# Patient Record
Sex: Female | Born: 1960
Health system: Southern US, Community
[De-identification: ages and names within clinical notes are randomized; demographics above are authoritative.]

## PROBLEM LIST (undated history)

## (undated) DIAGNOSIS — H269 Unspecified cataract: Secondary | ICD-10-CM

## (undated) DIAGNOSIS — G510 Bell's palsy: Secondary | ICD-10-CM

## (undated) DIAGNOSIS — G709 Myoneural disorder, unspecified: Secondary | ICD-10-CM

## (undated) DIAGNOSIS — A419 Sepsis, unspecified organism: Secondary | ICD-10-CM

## (undated) DIAGNOSIS — E119 Type 2 diabetes mellitus without complications: Secondary | ICD-10-CM

## (undated) DIAGNOSIS — E11319 Type 2 diabetes mellitus with unspecified diabetic retinopathy without macular edema: Secondary | ICD-10-CM

## (undated) DIAGNOSIS — G629 Polyneuropathy, unspecified: Secondary | ICD-10-CM

## (undated) DIAGNOSIS — I10 Essential (primary) hypertension: Secondary | ICD-10-CM

## (undated) DIAGNOSIS — H35039 Hypertensive retinopathy, unspecified eye: Secondary | ICD-10-CM

## (undated) DIAGNOSIS — E785 Hyperlipidemia, unspecified: Secondary | ICD-10-CM

## (undated) DIAGNOSIS — F32A Depression, unspecified: Secondary | ICD-10-CM

## (undated) HISTORY — DX: Type 2 diabetes mellitus with unspecified diabetic retinopathy without macular edema: E11.319

## (undated) HISTORY — DX: Hypertensive retinopathy, unspecified eye: H35.039

## (undated) HISTORY — PX: CHOLECYSTECTOMY: SHX55

## (undated) HISTORY — DX: Unspecified cataract: H26.9

---

## 1998-11-11 ENCOUNTER — Ambulatory Visit (HOSPITAL_COMMUNITY): Admission: RE | Admit: 1998-11-11 | Discharge: 1998-11-11 | Payer: Self-pay | Admitting: Family Medicine

## 1998-11-11 ENCOUNTER — Encounter: Payer: Self-pay | Admitting: Family Medicine

## 2001-10-18 ENCOUNTER — Emergency Department (HOSPITAL_COMMUNITY): Admission: EM | Admit: 2001-10-18 | Discharge: 2001-10-18 | Payer: Self-pay | Admitting: *Deleted

## 2001-10-18 ENCOUNTER — Encounter: Payer: Self-pay | Admitting: *Deleted

## 2001-10-24 ENCOUNTER — Encounter: Admission: RE | Admit: 2001-10-24 | Discharge: 2001-10-24 | Payer: Self-pay | Admitting: Internal Medicine

## 2003-03-16 DIAGNOSIS — G510 Bell's palsy: Secondary | ICD-10-CM

## 2003-03-16 HISTORY — DX: Bell's palsy: G51.0

## 2005-10-18 ENCOUNTER — Emergency Department (HOSPITAL_COMMUNITY): Admission: EM | Admit: 2005-10-18 | Discharge: 2005-10-18 | Payer: Self-pay | Admitting: Emergency Medicine

## 2005-12-09 ENCOUNTER — Ambulatory Visit: Payer: Self-pay | Admitting: Hospitalist

## 2006-08-05 ENCOUNTER — Encounter (INDEPENDENT_AMBULATORY_CARE_PROVIDER_SITE_OTHER): Payer: Self-pay | Admitting: Pulmonary Disease

## 2006-08-05 ENCOUNTER — Ambulatory Visit: Payer: Self-pay | Admitting: Internal Medicine

## 2006-08-05 DIAGNOSIS — I10 Essential (primary) hypertension: Secondary | ICD-10-CM | POA: Insufficient documentation

## 2006-08-05 DIAGNOSIS — E1140 Type 2 diabetes mellitus with diabetic neuropathy, unspecified: Secondary | ICD-10-CM

## 2006-08-05 DIAGNOSIS — F320 Major depressive disorder, single episode, mild: Secondary | ICD-10-CM

## 2006-08-05 DIAGNOSIS — Z8669 Personal history of other diseases of the nervous system and sense organs: Secondary | ICD-10-CM

## 2006-08-05 DIAGNOSIS — F17200 Nicotine dependence, unspecified, uncomplicated: Secondary | ICD-10-CM | POA: Insufficient documentation

## 2006-08-05 DIAGNOSIS — Z9089 Acquired absence of other organs: Secondary | ICD-10-CM

## 2006-08-05 DIAGNOSIS — G4700 Insomnia, unspecified: Secondary | ICD-10-CM

## 2006-08-05 DIAGNOSIS — Z9079 Acquired absence of other genital organ(s): Secondary | ICD-10-CM | POA: Insufficient documentation

## 2006-08-05 LAB — CONVERTED CEMR LAB
Alkaline Phosphatase: 85 units/L (ref 39–117)
BUN: 14 mg/dL (ref 6–23)
CO2: 19 meq/L (ref 19–32)
Creatinine, Ser: 0.59 mg/dL (ref 0.40–1.20)
Glucose, Bld: 298 mg/dL — ABNORMAL HIGH (ref 70–99)
HCT: 47.4 % — ABNORMAL HIGH (ref 36.0–46.0)
Hemoglobin: 16.1 g/dL — ABNORMAL HIGH (ref 12.0–15.0)
MCHC: 34 g/dL (ref 30.0–36.0)
MCV: 85.1 fL (ref 78.0–100.0)
RBC: 5.57 M/uL — ABNORMAL HIGH (ref 3.87–5.11)
Sodium: 135 meq/L (ref 135–145)
Total Bilirubin: 0.3 mg/dL (ref 0.3–1.2)
WBC: 9.9 10*3/uL (ref 4.0–10.5)

## 2006-08-19 ENCOUNTER — Encounter (INDEPENDENT_AMBULATORY_CARE_PROVIDER_SITE_OTHER): Payer: Self-pay | Admitting: Internal Medicine

## 2006-08-19 ENCOUNTER — Ambulatory Visit: Payer: Self-pay | Admitting: Internal Medicine

## 2006-08-23 LAB — CONVERTED CEMR LAB
BUN: 19 mg/dL (ref 6–23)
Bilirubin Urine: NEGATIVE
Chloride: 100 meq/L (ref 96–112)
Creatinine, Urine: 89.4 mg/dL
Leukocytes, UA: NEGATIVE
Microalb Creat Ratio: 79.8 mg/g — ABNORMAL HIGH (ref 0.0–30.0)
Microalb, Ur: 7.13 mg/dL — ABNORMAL HIGH (ref 0.00–1.89)
Potassium: 3.9 meq/L (ref 3.5–5.3)
Protein, ur: NEGATIVE mg/dL
RBC / HPF: NONE SEEN (ref <3)
Sodium: 140 meq/L (ref 135–145)
Specific Gravity, Urine: 1.027 (ref 1.005–1.03)
Urine Glucose: >1000 mg/dL — AB

## 2006-08-24 ENCOUNTER — Ambulatory Visit (HOSPITAL_COMMUNITY): Admission: RE | Admit: 2006-08-24 | Discharge: 2006-08-24 | Payer: Self-pay | Admitting: Obstetrics and Gynecology

## 2006-08-31 ENCOUNTER — Telehealth (INDEPENDENT_AMBULATORY_CARE_PROVIDER_SITE_OTHER): Payer: Self-pay | Admitting: *Deleted

## 2006-09-08 ENCOUNTER — Telehealth (INDEPENDENT_AMBULATORY_CARE_PROVIDER_SITE_OTHER): Payer: Self-pay | Admitting: Internal Medicine

## 2006-09-08 ENCOUNTER — Ambulatory Visit: Payer: Self-pay | Admitting: Internal Medicine

## 2006-09-08 LAB — CONVERTED CEMR LAB: Blood Glucose, Home Monitor: 3 mg/dL

## 2006-09-13 ENCOUNTER — Telehealth: Payer: Self-pay | Admitting: *Deleted

## 2006-09-28 ENCOUNTER — Telehealth: Payer: Self-pay | Admitting: *Deleted

## 2006-09-30 ENCOUNTER — Ambulatory Visit: Payer: Self-pay | Admitting: *Deleted

## 2006-09-30 ENCOUNTER — Encounter (INDEPENDENT_AMBULATORY_CARE_PROVIDER_SITE_OTHER): Payer: Self-pay | Admitting: *Deleted

## 2006-09-30 DIAGNOSIS — N760 Acute vaginitis: Secondary | ICD-10-CM | POA: Insufficient documentation

## 2006-10-01 ENCOUNTER — Encounter (INDEPENDENT_AMBULATORY_CARE_PROVIDER_SITE_OTHER): Payer: Self-pay | Admitting: *Deleted

## 2006-10-01 LAB — CONVERTED CEMR LAB
Gardnerella vaginalis: NEGATIVE
Trichomonal Vaginitis: NEGATIVE

## 2006-10-10 ENCOUNTER — Telehealth (INDEPENDENT_AMBULATORY_CARE_PROVIDER_SITE_OTHER): Payer: Self-pay | Admitting: *Deleted

## 2006-10-24 ENCOUNTER — Telehealth (INDEPENDENT_AMBULATORY_CARE_PROVIDER_SITE_OTHER): Payer: Self-pay | Admitting: *Deleted

## 2006-11-07 ENCOUNTER — Encounter (INDEPENDENT_AMBULATORY_CARE_PROVIDER_SITE_OTHER): Payer: Self-pay | Admitting: *Deleted

## 2007-05-03 ENCOUNTER — Telehealth (INDEPENDENT_AMBULATORY_CARE_PROVIDER_SITE_OTHER): Payer: Self-pay | Admitting: *Deleted

## 2007-07-30 ENCOUNTER — Emergency Department (HOSPITAL_COMMUNITY): Admission: EM | Admit: 2007-07-30 | Discharge: 2007-07-30 | Payer: Self-pay | Admitting: Family Medicine

## 2007-09-05 ENCOUNTER — Telehealth (INDEPENDENT_AMBULATORY_CARE_PROVIDER_SITE_OTHER): Payer: Self-pay | Admitting: *Deleted

## 2008-01-01 ENCOUNTER — Emergency Department (HOSPITAL_COMMUNITY): Admission: EM | Admit: 2008-01-01 | Discharge: 2008-01-01 | Payer: Self-pay | Admitting: Emergency Medicine

## 2008-06-04 ENCOUNTER — Encounter: Admission: RE | Admit: 2008-06-04 | Discharge: 2008-06-04 | Payer: Self-pay | Admitting: Orthopedic Surgery

## 2010-04-22 ENCOUNTER — Inpatient Hospital Stay (INDEPENDENT_AMBULATORY_CARE_PROVIDER_SITE_OTHER)
Admission: RE | Admit: 2010-04-22 | Discharge: 2010-04-22 | Disposition: A | Discharge status: Home / Self Care | Payer: Self-pay | Source: Ambulatory Visit | Attending: Family Medicine | Admitting: Family Medicine

## 2010-04-22 DIAGNOSIS — R071 Chest pain on breathing: Secondary | ICD-10-CM

## 2010-04-22 LAB — POCT URINALYSIS DIPSTICK
Bilirubin Urine: NEGATIVE
Nitrite: POSITIVE — AB
Protein, ur: NEGATIVE mg/dL
Specific Gravity, Urine: 1.02 (ref 1.005–1.030)
Urine Glucose, Fasting: >=1000 mg/dL — AB
Urobilinogen, UA: 0.2 mg/dL (ref 0.0–1.0)
pH: 5 (ref 5.0–8.0)

## 2010-06-19 ENCOUNTER — Other Ambulatory Visit: Payer: Self-pay | Admitting: Family Medicine

## 2010-06-19 DIAGNOSIS — Z1231 Encounter for screening mammogram for malignant neoplasm of breast: Secondary | ICD-10-CM

## 2010-06-25 ENCOUNTER — Ambulatory Visit (HOSPITAL_COMMUNITY)
Admission: RE | Admit: 2010-06-25 | Discharge: 2010-06-25 | Disposition: A | Discharge status: Home / Self Care | Payer: Self-pay | Source: Ambulatory Visit | Attending: Family Medicine | Admitting: Family Medicine

## 2010-06-25 DIAGNOSIS — Z1231 Encounter for screening mammogram for malignant neoplasm of breast: Secondary | ICD-10-CM

## 2010-12-09 LAB — POCT URINALYSIS DIP (DEVICE)
Glucose, UA: NEGATIVE
Operator id: 270961
Protein, ur: >=300 — AB
Urobilinogen, UA: 1

## 2010-12-09 LAB — URINE CULTURE

## 2012-02-28 ENCOUNTER — Encounter (HOSPITAL_COMMUNITY): Payer: Self-pay | Admitting: Emergency Medicine

## 2012-02-28 ENCOUNTER — Emergency Department (HOSPITAL_COMMUNITY): Payer: Self-pay

## 2012-02-28 ENCOUNTER — Emergency Department (HOSPITAL_COMMUNITY)
Admission: EM | Admit: 2012-02-28 | Discharge: 2012-02-29 | Disposition: A | Discharge status: Home / Self Care | Payer: Self-pay | Attending: Emergency Medicine | Admitting: Emergency Medicine

## 2012-02-28 DIAGNOSIS — Y929 Unspecified place or not applicable: Secondary | ICD-10-CM | POA: Insufficient documentation

## 2012-02-28 DIAGNOSIS — S0181XA Laceration without foreign body of other part of head, initial encounter: Secondary | ICD-10-CM

## 2012-02-28 DIAGNOSIS — IMO0002 Reserved for concepts with insufficient information to code with codable children: Secondary | ICD-10-CM | POA: Insufficient documentation

## 2012-02-28 DIAGNOSIS — Y939 Activity, unspecified: Secondary | ICD-10-CM | POA: Insufficient documentation

## 2012-02-28 DIAGNOSIS — S0180XA Unspecified open wound of other part of head, initial encounter: Secondary | ICD-10-CM | POA: Insufficient documentation

## 2012-02-28 DIAGNOSIS — E1169 Type 2 diabetes mellitus with other specified complication: Secondary | ICD-10-CM | POA: Insufficient documentation

## 2012-02-28 DIAGNOSIS — W19XXXA Unspecified fall, initial encounter: Secondary | ICD-10-CM

## 2012-02-28 DIAGNOSIS — R42 Dizziness and giddiness: Secondary | ICD-10-CM | POA: Insufficient documentation

## 2012-02-28 DIAGNOSIS — I10 Essential (primary) hypertension: Secondary | ICD-10-CM | POA: Insufficient documentation

## 2012-02-28 DIAGNOSIS — R739 Hyperglycemia, unspecified: Secondary | ICD-10-CM

## 2012-02-28 DIAGNOSIS — W108XXA Fall (on) (from) other stairs and steps, initial encounter: Secondary | ICD-10-CM | POA: Insufficient documentation

## 2012-02-28 HISTORY — DX: Essential (primary) hypertension: I10

## 2012-02-28 HISTORY — DX: Type 2 diabetes mellitus without complications: E11.9

## 2012-02-28 MED ORDER — HYDROMORPHONE HCL PF 1 MG/ML IJ SOLN: 1.0000 mg | Freq: Once | INTRAMUSCULAR | Status: DC

## 2012-02-28 MED ORDER — LIDOCAINE-EPINEPHRINE 2 %-1:100000 IJ SOLN
20.0000 mL | Freq: Once | INTRAMUSCULAR | Status: AC
  Administered 2012-02-29: 20 mL via INTRADERMAL
  Filled 2012-02-28: qty 20

## 2012-02-28 MED ORDER — SODIUM CHLORIDE 0.9 % IV BOLUS (SEPSIS): 1000.0000 mL | Freq: Once | INTRAVENOUS | Status: DC

## 2012-02-28 MED ORDER — HYDROMORPHONE HCL PF 1 MG/ML IJ SOLN
1.0000 mg | Freq: Once | INTRAMUSCULAR | Status: AC
  Administered 2012-02-28: 1 mg via INTRAMUSCULAR
  Filled 2012-02-28: qty 1

## 2012-02-28 MED ORDER — IOHEXOL 300 MG/ML  SOLN: 20.0000 mL | INTRAMUSCULAR | Status: DC

## 2012-02-28 MED ORDER — TRAMADOL HCL 50 MG PO TABS: 50.0000 mg | ORAL_TABLET | Freq: Four times a day (QID) | ORAL | Status: DC | PRN
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
## 2012-02-28 NOTE — ED Notes (Signed)
MD at bedside. 

## 2012-02-28 NOTE — ED Notes (Signed)
Patient transported to CT 

## 2012-02-28 NOTE — ED Provider Notes (Signed)
History     CSN: [id number]  Arrival date & time 02/28/12  2057   First MD Initiated Contact with Patient 02/28/12 2132      Chief Complaint  Patient presents with  . Fall  . Hypotension    (Consider location/radiation/quality/duration/timing/severity/associated sxs/prior treatment) HPI The patient presents after a fall.  She stumbled, fell backwards and down approximately 5 steps and struck her for head against concrete.  No loss of consciousness, but subsequent lightheadedness, pain about the anterior head.  The pain is sharp, nonradiating.  Pain is worse with motion or palpation.  There is no new visual changes, vomiting, diarrhea, ataxia.  The patient has been hematuria.  Just complains of pain about her coccyx, worse with motion.  No new ataxia, no incontinence, no chest pain, no abdominal pain. Past Medical History  Diagnosis Date  . Diabetes mellitus without complication   . Hypertension     Past Surgical History  Procedure Date  . Cholecystectomy   . Cesarean section     No family history on file.  History  Substance Use Topics  . Smoking status: Current Every Day Smoker  . Smokeless tobacco: Not on file  . Alcohol Use: No    OB History    Grav Para Term Preterm Abortions TAB SAB Ect Mult Living                  Review of Systems  Constitutional:       Per HPI, otherwise negative  HENT:       Per HPI, otherwise negative  Eyes: Negative.   Respiratory:       Per HPI, otherwise negative  Cardiovascular:       Per HPI, otherwise negative  Gastrointestinal: Negative for vomiting.  Genitourinary: Negative.   Musculoskeletal:       Per HPI, otherwise negative  Skin: Negative.   Neurological: Negative for syncope and weakness.    Allergies  Review of patient's allergies indicates no known allergies.  Home Medications  No current outpatient prescriptions on file.  BP 98/61  Pulse 92  Temp 98 F (36.7 C) (Oral)  Resp 18  SpO2 97%  Physical  Exam  Nursing note and vitals reviewed. Constitutional: She is oriented to person, place, and time. She appears well-developed and well-nourished. No distress. Cervical collar in place.  HENT:  Head: Normocephalic and atraumatic. Head is without Battle's sign, without right periorbital erythema and without left periorbital erythema.    Mouth/Throat: Uvula is midline, oropharynx is clear and moist and mucous membranes are normal.       No TMJ pain, no broken teeth  Eyes: Conjunctivae normal and EOM are normal.  Neck: Full passive range of motion without pain. No spinous process tenderness and no muscular tenderness present. No rigidity. No edema, no erythema and normal range of motion present.  Cardiovascular: Normal rate and regular rhythm.   Pulmonary/Chest: Effort normal and breath sounds normal. No stridor. No respiratory distress.  Abdominal: She exhibits no distension.  Musculoskeletal: She exhibits no edema.  Neurological: She is alert and oriented to person, place, and time. No cranial nerve deficit.  Skin: Skin is warm and dry.  Psychiatric: She has a normal mood and affect.    ED Course  Procedures (including critical care time)  Labs Reviewed  GLUCOSE, CAPILLARY - Abnormal; Notable for the following:    Glucose-Capillary 336 (*)     All other components within normal limits   No results found.  No diagnosis found.  Cardiac: 85sr, normal  O2- 99%ra, normal  bp improves during hospitalization  LACERATION REPAIR Performed by: Gerhard Munch Authorized by: Gerhard Munch Consent: Verbal consent obtained. Risks and benefits: risks, benefits and alternatives were discussed Consent given by: patient Patient identity confirmed: provided demographic data Prepped and Draped in normal sterile fashion Wound explored  Laceration Location: forehead  Laceration Length: 12cm  No Foreign Bodies seen or palpated  Anesthesia: local infiltration  Local anesthetic:  lidocaine 2% with epinephrine  Anesthetic total: 5 ml  Irrigation method: syringe Amount of cleaning: standard  Skin closure: 5-0 sutures  Number of sutures: 6  Technique: close approximation with central purse-string suture for stellate wound.  Patient tolerance: Patient tolerated the procedure well with no immediate complications.   MDM  This patient presents after a fall with head laceration.  Given the degree of the wound, there was a CT scan indicated.  This was unremarkable.  The patient's ED course she remained in no distress.  Though the patient was initially mildly hypotensive, this improved.  The patient's wound is repaired without complication.  The patient is hyperglycemic, she has a history of diabetes, and absent distress, ongoing other illnesses, there is little suspicion for palpitations related to the diabetes, this more likely reflects poorly controlled blood sugar.  The patient is here with multiple family members him and following repair of her wound, she is discharged in stable condition    Gerhard Munch, MD 02/28/12 2333
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
## 2012-02-29 NOTE — ED Notes (Signed)
Patient is alert and orientedx4.  Patient was explained discharge instructions and she understood them.  Patient's husband, Diedre Maclellan is taking the patient home.

## 2012-06-23 ENCOUNTER — Telehealth: Payer: Self-pay | Admitting: Family Medicine

## 2012-06-23 MED ORDER — GABAPENTIN 600 MG PO TABS: 600.0000 mg | ORAL_TABLET | Freq: Three times a day (TID) | ORAL | Status: DC

## 2012-06-23 NOTE — Telephone Encounter (Signed)
rx refilled.

## 2012-07-12 ENCOUNTER — Telehealth: Payer: Self-pay | Admitting: Family Medicine

## 2012-07-12 MED ORDER — AMITRIPTYLINE HCL 50 MG PO TABS: ORAL_TABLET | ORAL | Status: DC

## 2012-07-12 NOTE — Telephone Encounter (Signed)
Rx Refilled  

## 2012-08-22 ENCOUNTER — Telehealth: Payer: Self-pay | Admitting: Family Medicine

## 2012-08-23 MED ORDER — METOPROLOL TARTRATE 25 MG PO TABS: 25.0000 mg | ORAL_TABLET | Freq: Two times a day (BID) | ORAL | Status: DC

## 2012-08-23 MED ORDER — LISINOPRIL 40 MG PO TABS: 40.0000 mg | ORAL_TABLET | Freq: Every day | ORAL | Status: DC

## 2012-08-23 MED ORDER — GLIPIZIDE 10 MG PO TABS: 10.0000 mg | ORAL_TABLET | Freq: Every day | ORAL | Status: DC

## 2012-08-23 MED ORDER — HYDROCHLOROTHIAZIDE 25 MG PO TABS: 25.0000 mg | ORAL_TABLET | Freq: Every day | ORAL | Status: DC

## 2012-08-23 MED ORDER — METFORMIN HCL 1000 MG PO TABS: 1000.0000 mg | ORAL_TABLET | Freq: Two times a day (BID) | ORAL | Status: DC

## 2012-08-23 NOTE — Telephone Encounter (Signed)
Rx Refilled  

## 2012-11-20 ENCOUNTER — Telehealth: Payer: Self-pay | Admitting: Family Medicine

## 2012-11-20 MED ORDER — GABAPENTIN 600 MG PO TABS: 600.0000 mg | ORAL_TABLET | Freq: Three times a day (TID) | ORAL | Status: DC

## 2012-11-20 NOTE — Telephone Encounter (Signed)
Gabapentin 600 mg tab 1 TID #270

## 2012-11-20 NOTE — Telephone Encounter (Signed)
Rx Refilled  

## 2013-07-10 ENCOUNTER — Other Ambulatory Visit: Payer: Self-pay | Admitting: Family Medicine

## 2013-09-13 ENCOUNTER — Emergency Department (HOSPITAL_COMMUNITY)
Admission: EM | Admit: 2013-09-13 | Discharge: 2013-09-13 | Disposition: A | Discharge status: Home / Self Care | Payer: Self-pay | Attending: Emergency Medicine | Admitting: Emergency Medicine

## 2013-09-13 ENCOUNTER — Encounter (HOSPITAL_COMMUNITY): Payer: Self-pay | Admitting: Emergency Medicine

## 2013-09-13 DIAGNOSIS — I10 Essential (primary) hypertension: Secondary | ICD-10-CM | POA: Insufficient documentation

## 2013-09-13 DIAGNOSIS — Z5189 Encounter for other specified aftercare: Secondary | ICD-10-CM

## 2013-09-13 DIAGNOSIS — Y9301 Activity, walking, marching and hiking: Secondary | ICD-10-CM | POA: Insufficient documentation

## 2013-09-13 DIAGNOSIS — T25029A Burn of unspecified degree of unspecified foot, initial encounter: Secondary | ICD-10-CM | POA: Insufficient documentation

## 2013-09-13 DIAGNOSIS — Z792 Long term (current) use of antibiotics: Secondary | ICD-10-CM | POA: Insufficient documentation

## 2013-09-13 DIAGNOSIS — T300 Burn of unspecified body region, unspecified degree: Secondary | ICD-10-CM

## 2013-09-13 DIAGNOSIS — L03119 Cellulitis of unspecified part of limb: Secondary | ICD-10-CM

## 2013-09-13 DIAGNOSIS — Z79899 Other long term (current) drug therapy: Secondary | ICD-10-CM | POA: Insufficient documentation

## 2013-09-13 DIAGNOSIS — E1149 Type 2 diabetes mellitus with other diabetic neurological complication: Secondary | ICD-10-CM | POA: Insufficient documentation

## 2013-09-13 DIAGNOSIS — L02419 Cutaneous abscess of limb, unspecified: Secondary | ICD-10-CM | POA: Insufficient documentation

## 2013-09-13 DIAGNOSIS — L02619 Cutaneous abscess of unspecified foot: Secondary | ICD-10-CM | POA: Insufficient documentation

## 2013-09-13 DIAGNOSIS — Z4801 Encounter for change or removal of surgical wound dressing: Secondary | ICD-10-CM | POA: Insufficient documentation

## 2013-09-13 DIAGNOSIS — X19XXXA Contact with other heat and hot substances, initial encounter: Secondary | ICD-10-CM | POA: Insufficient documentation

## 2013-09-13 DIAGNOSIS — Z7982 Long term (current) use of aspirin: Secondary | ICD-10-CM | POA: Insufficient documentation

## 2013-09-13 DIAGNOSIS — F17200 Nicotine dependence, unspecified, uncomplicated: Secondary | ICD-10-CM | POA: Insufficient documentation

## 2013-09-13 DIAGNOSIS — E1142 Type 2 diabetes mellitus with diabetic polyneuropathy: Secondary | ICD-10-CM | POA: Insufficient documentation

## 2013-09-13 DIAGNOSIS — Y9289 Other specified places as the place of occurrence of the external cause: Secondary | ICD-10-CM | POA: Insufficient documentation

## 2013-09-13 MED ORDER — CLINDAMYCIN HCL 300 MG PO CAPS
300.0000 mg | ORAL_CAPSULE | Freq: Once | ORAL | Status: AC
  Administered 2013-09-13: 300 mg via ORAL
  Filled 2013-09-13: qty 2
  Filled 2013-09-13: qty 1

## 2013-09-13 MED ORDER — ONDANSETRON HCL 4 MG PO TABS: 4.0000 mg | ORAL_TABLET | Freq: Four times a day (QID) | ORAL | Status: DC

## 2013-09-13 MED ORDER — OXYCODONE-ACETAMINOPHEN 5-325 MG PO TABS
2.0000 | ORAL_TABLET | Freq: Once | ORAL | Status: AC
  Administered 2013-09-13: 2 via ORAL
  Filled 2013-09-13: qty 2

## 2013-09-13 MED ORDER — HYDROCODONE-ACETAMINOPHEN 5-325 MG PO TABS: 1.0000 | ORAL_TABLET | ORAL | Status: DC | PRN

## 2013-09-13 MED ORDER — ONDANSETRON 4 MG PO TBDP
4.0000 mg | ORAL_TABLET | Freq: Once | ORAL | Status: AC
  Administered 2013-09-13: 4 mg via ORAL
  Filled 2013-09-13: qty 1

## 2013-09-13 MED ORDER — FLUCONAZOLE 200 MG PO TABS: 200.0000 mg | ORAL_TABLET | Freq: Every day | ORAL | Status: AC

## 2013-09-13 MED ORDER — CLINDAMYCIN HCL 150 MG PO CAPS: 150.0000 mg | ORAL_CAPSULE | Freq: Four times a day (QID) | ORAL | Status: DC
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
## 2013-09-13 NOTE — Discharge Instructions (Signed)
Cellulitis Cellulitis is an infection of the skin and the tissue beneath it. The infected area is usually red and tender. Cellulitis occurs most often in the arms and lower legs.  CAUSES  Cellulitis is caused by bacteria that enter the skin through cracks or cuts in the skin. The most common types of bacteria that cause cellulitis are Staphylococcus and Streptococcus. SYMPTOMS   Redness and warmth.  Swelling.  Tenderness or pain.  Fever. DIAGNOSIS  Your caregiver can usually determine what is wrong based on a physical exam. Blood tests may also be done. TREATMENT  Treatment usually involves taking an antibiotic medicine. HOME CARE INSTRUCTIONS   Take your antibiotics as directed. Finish them even if you start to feel better.  Keep the infected arm or leg elevated to reduce swelling.  Apply a warm cloth to the affected area up to 4 times per day to relieve pain.  Only take over-the-counter or prescription medicines for pain, discomfort, or fever as directed by your caregiver.  Keep all follow-up appointments as directed by your caregiver. SEEK MEDICAL CARE IF:   You notice red streaks coming from the infected area.  Your red area gets larger or turns dark in color.  Your bone or joint underneath the infected area becomes painful after the skin has healed.  Your infection returns in the same area or another area.  You notice a swollen bump in the infected area.  You develop new symptoms. SEEK IMMEDIATE MEDICAL CARE IF:   You have a fever.  You feel very sleepy.  You develop vomiting or diarrhea.  You have a general ill feeling (malaise) with muscle aches and pains. MAKE SURE YOU:   Understand these instructions.  Will watch your condition.  Will get help right away if you are not doing well or get worse. Document Released: 12/09/2004 Document Revised: 08/31/2011 Document Reviewed: 05/17/2011 [facility] Patient Information 2015 [place], [place]. This information is  not intended to replace advice given to you by your health care provider. Make sure you discuss any questions you have with your health care provider. Burn Care Your skin is a natural barrier to infection. It is the largest organ of your body. Burns damage this natural protection. To help prevent infection, it is very important to follow your caregiver's instructions in the care of your burn. Burns are classified as:  First degree. There is only redness of the skin (erythema). No scarring is expected.  Second degree. There is blistering of the skin. Scarring may occur with deeper burns.  Third degree. All layers of the skin are injured, and scarring is expected. HOME CARE INSTRUCTIONS   Wash your hands well before changing your bandage.  Change your bandage as often as directed by your caregiver.  Remove the old bandage. If the bandage sticks, you may soak it off with cool, clean water.  Cleanse the burn thoroughly but gently with mild soap and water.  Pat the area dry with a clean, dry cloth.  Apply a thin layer of antibacterial cream to the burn.  Apply a clean bandage as instructed by your caregiver.  Keep the bandage as clean and dry as possible.  Elevate the affected area for the first 24 hours, then as instructed by your caregiver.  Only take over-the-counter or prescription medicines for pain, discomfort, or fever as directed by your caregiver. SEEK IMMEDIATE MEDICAL CARE IF:   You develop excessive pain.  You develop redness, tenderness, swelling, or red streaks near the burn.  The burned area develops yellowish-white fluid (pus) or a bad smell.  You have a fever. MAKE SURE YOU:   Understand these instructions.  Will watch your condition.  Will get help right away if you are not doing well or get worse. Document Released: 03/01/2005 Document Revised: 05/24/2011 Document Reviewed: 07/22/2010 [facility] Patient Information 2015 [place], [place]. This information is  not intended to replace advice given to you by your health care provider. Make sure you discuss any questions you have with your health care provider.
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
## 2013-09-14 NOTE — ED Provider Notes (Signed)
Medical screening examination/treatment/procedure(s) were performed by non-physician practitioner and as supervising physician I was immediately available for consultation/collaboration.   EKG Interpretation None       Kiora Hallberg, MD 09/14/13 0648 

## 2013-09-17 ENCOUNTER — Encounter (HOSPITAL_COMMUNITY): Payer: Self-pay | Admitting: Emergency Medicine

## 2013-09-17 ENCOUNTER — Emergency Department (HOSPITAL_COMMUNITY)
Admission: EM | Admit: 2013-09-17 | Discharge: 2013-09-17 | Disposition: A | Discharge status: Home / Self Care | Payer: Self-pay | Attending: Emergency Medicine | Admitting: Emergency Medicine

## 2013-09-17 DIAGNOSIS — L03119 Cellulitis of unspecified part of limb: Secondary | ICD-10-CM

## 2013-09-17 DIAGNOSIS — F17200 Nicotine dependence, unspecified, uncomplicated: Secondary | ICD-10-CM | POA: Insufficient documentation

## 2013-09-17 DIAGNOSIS — L02419 Cutaneous abscess of limb, unspecified: Secondary | ICD-10-CM | POA: Insufficient documentation

## 2013-09-17 DIAGNOSIS — Z792 Long term (current) use of antibiotics: Secondary | ICD-10-CM | POA: Insufficient documentation

## 2013-09-17 DIAGNOSIS — Z79899 Other long term (current) drug therapy: Secondary | ICD-10-CM | POA: Insufficient documentation

## 2013-09-17 DIAGNOSIS — E119 Type 2 diabetes mellitus without complications: Secondary | ICD-10-CM | POA: Insufficient documentation

## 2013-09-17 DIAGNOSIS — Z7982 Long term (current) use of aspirin: Secondary | ICD-10-CM | POA: Insufficient documentation

## 2013-09-17 DIAGNOSIS — I10 Essential (primary) hypertension: Secondary | ICD-10-CM | POA: Insufficient documentation

## 2013-09-17 LAB — BASIC METABOLIC PANEL
Anion gap: 17 — ABNORMAL HIGH (ref 5–15)
BUN: 21 mg/dL (ref 6–23)
CO2: 23 mEq/L (ref 19–32)
Calcium: 9.6 mg/dL (ref 8.4–10.5)
Chloride: 95 mEq/L — ABNORMAL LOW (ref 96–112)
Creatinine, Ser: 0.61 mg/dL (ref 0.50–1.10)
GFR calc Af Amer: >90 mL/min (ref >90)
GFR calc non Af Amer: >90 mL/min (ref >90)
Glucose, Bld: 262 mg/dL — ABNORMAL HIGH (ref 70–99)
Potassium: 4.4 mEq/L (ref 3.7–5.3)
Sodium: 135 mEq/L — ABNORMAL LOW (ref 137–147)

## 2013-09-17 LAB — CBC
HCT: 39.5 % (ref 36.0–46.0)
Hemoglobin: 13.1 g/dL (ref 12.0–15.0)
MCH: 29.3 pg (ref 26.0–34.0)
MCHC: 33.2 g/dL (ref 30.0–36.0)
MCV: 88.4 fL (ref 78.0–100.0)
Platelets: 256 10*3/uL (ref 150–400)
RBC: 4.47 MIL/uL (ref 3.87–5.11)
RDW: 13.6 % (ref 11.5–15.5)
WBC: 6.9 10*3/uL (ref 4.0–10.5)

## 2013-09-17 MED ORDER — CLINDAMYCIN PHOSPHATE 900 MG/50ML IV SOLN
900.0000 mg | Freq: Once | INTRAVENOUS | Status: AC
  Administered 2013-09-17: 900 mg via INTRAVENOUS
  Filled 2013-09-17: qty 50

## 2013-09-17 MED ORDER — OXYCODONE-ACETAMINOPHEN 5-325 MG PO TABS: 1.0000 | ORAL_TABLET | Freq: Four times a day (QID) | ORAL | Status: DC | PRN

## 2013-09-17 MED ORDER — HYDROMORPHONE HCL PF 1 MG/ML IJ SOLN
1.0000 mg | Freq: Once | INTRAMUSCULAR | Status: AC
  Administered 2013-09-17: 1 mg via INTRAVENOUS
  Filled 2013-09-17: qty 1

## 2013-09-17 MED ORDER — SILVER SULFADIAZINE 1 % EX CREA: TOPICAL_CREAM | Freq: Once | CUTANEOUS | Status: DC

## 2013-09-17 MED ORDER — CLINDAMYCIN HCL 150 MG PO CAPS: ORAL_CAPSULE | ORAL | Status: DC

## 2013-09-17 NOTE — Discharge Instructions (Signed)
Return here in 2 days, or followup with your primary care Dr. for recheck.  Return here for any worsening in your condition.  Keep the area is on her foot clean and dry
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
## 2013-09-17 NOTE — ED Notes (Signed)
Per pt she has been taking medications for cellulitis and feels like she is getting worse. sts increased pain in left leg. sts most painful behind knee. sts also she is having some memory issues.

## 2013-09-19 ENCOUNTER — Inpatient Hospital Stay (HOSPITAL_COMMUNITY)
Admission: EM | Admit: 2013-09-19 | Discharge: 2013-09-21 | DRG: 603 | Disposition: A | Discharge status: Home / Self Care | Payer: Self-pay | Attending: Oncology | Admitting: Oncology

## 2013-09-19 ENCOUNTER — Encounter (HOSPITAL_COMMUNITY): Payer: Self-pay | Admitting: Emergency Medicine

## 2013-09-19 DIAGNOSIS — T25229A Burn of second degree of unspecified foot, initial encounter: Secondary | ICD-10-CM | POA: Diagnosis present

## 2013-09-19 DIAGNOSIS — X19XXXA Contact with other heat and hot substances, initial encounter: Secondary | ICD-10-CM | POA: Diagnosis present

## 2013-09-19 DIAGNOSIS — Z794 Long term (current) use of insulin: Secondary | ICD-10-CM

## 2013-09-19 DIAGNOSIS — L02619 Cutaneous abscess of unspecified foot: Principal | ICD-10-CM | POA: Diagnosis present

## 2013-09-19 DIAGNOSIS — E1142 Type 2 diabetes mellitus with diabetic polyneuropathy: Secondary | ICD-10-CM | POA: Diagnosis present

## 2013-09-19 DIAGNOSIS — L03119 Cellulitis of unspecified part of limb: Principal | ICD-10-CM

## 2013-09-19 DIAGNOSIS — G510 Bell's palsy: Secondary | ICD-10-CM | POA: Insufficient documentation

## 2013-09-19 DIAGNOSIS — I10 Essential (primary) hypertension: Secondary | ICD-10-CM | POA: Diagnosis present

## 2013-09-19 DIAGNOSIS — F17200 Nicotine dependence, unspecified, uncomplicated: Secondary | ICD-10-CM | POA: Diagnosis present

## 2013-09-19 DIAGNOSIS — L02818 Cutaneous abscess of other sites: Secondary | ICD-10-CM

## 2013-09-19 DIAGNOSIS — Z7982 Long term (current) use of aspirin: Secondary | ICD-10-CM

## 2013-09-19 DIAGNOSIS — L03115 Cellulitis of right lower limb: Secondary | ICD-10-CM

## 2013-09-19 DIAGNOSIS — L03818 Cellulitis of other sites: Secondary | ICD-10-CM

## 2013-09-19 DIAGNOSIS — E1149 Type 2 diabetes mellitus with other diabetic neurological complication: Secondary | ICD-10-CM | POA: Diagnosis present

## 2013-09-19 DIAGNOSIS — L03116 Cellulitis of left lower limb: Secondary | ICD-10-CM

## 2013-09-19 HISTORY — DX: Myoneural disorder, unspecified: G70.9

## 2013-09-19 HISTORY — DX: Bell's palsy: G51.0

## 2013-09-19 LAB — URINALYSIS, ROUTINE W REFLEX MICROSCOPIC
Bilirubin Urine: NEGATIVE
GLUCOSE, UA: 500 mg/dL — AB
Hgb urine dipstick: NEGATIVE
Ketones, ur: NEGATIVE mg/dL
Nitrite: POSITIVE — AB
Protein, ur: NEGATIVE mg/dL
Specific Gravity, Urine: 1.018 (ref 1.005–1.030)
Urobilinogen, UA: 0.2 mg/dL (ref 0.0–1.0)
pH: 6 (ref 5.0–8.0)

## 2013-09-19 LAB — CBC WITH DIFFERENTIAL/PLATELET
BASOS ABS: 0 10*3/uL (ref 0.0–0.1)
Basophils Relative: 0 % (ref 0–1)
Eosinophils Absolute: 0.1 10*3/uL (ref 0.0–0.7)
Eosinophils Relative: 1 % (ref 0–5)
HCT: 37.2 % (ref 36.0–46.0)
Hemoglobin: 12.3 g/dL (ref 12.0–15.0)
Lymphocytes Relative: 31 % (ref 12–46)
Lymphs Abs: 3.1 10*3/uL (ref 0.7–4.0)
MCH: 28.9 pg (ref 26.0–34.0)
MCHC: 33.1 g/dL (ref 30.0–36.0)
MCV: 87.3 fL (ref 78.0–100.0)
Monocytes Absolute: 0.5 10*3/uL (ref 0.1–1.0)
Monocytes Relative: 5 % (ref 3–12)
Neutro Abs: 6.4 10*3/uL (ref 1.7–7.7)
Neutrophils Relative %: 63 % (ref 43–77)
PLATELETS: 282 10*3/uL (ref 150–400)
RBC: 4.26 MIL/uL (ref 3.87–5.11)
RDW: 13.6 % (ref 11.5–15.5)
WBC: 10 10*3/uL (ref 4.0–10.5)

## 2013-09-19 LAB — BASIC METABOLIC PANEL
ANION GAP: 20 — AB (ref 5–15)
BUN: 20 mg/dL (ref 6–23)
CALCIUM: 9.2 mg/dL (ref 8.4–10.5)
CO2: 20 mEq/L (ref 19–32)
Chloride: 94 mEq/L — ABNORMAL LOW (ref 96–112)
Creatinine, Ser: 0.71 mg/dL (ref 0.50–1.10)
GFR calc Af Amer: >90 mL/min (ref >90)
GFR calc non Af Amer: >90 mL/min (ref >90)
Glucose, Bld: 314 mg/dL — ABNORMAL HIGH (ref 70–99)
Potassium: 4 mEq/L (ref 3.7–5.3)
SODIUM: 134 meq/L — AB (ref 137–147)

## 2013-09-19 LAB — CBG MONITORING, ED
Glucose-Capillary: 305 mg/dL — ABNORMAL HIGH (ref 70–99)
Glucose-Capillary: 229 mg/dL — ABNORMAL HIGH (ref 70–99)

## 2013-09-19 LAB — HEMOGLOBIN A1C
HEMOGLOBIN A1C: 11.3 % — AB (ref <5.7)
MEAN PLASMA GLUCOSE: 278 mg/dL — AB (ref <117)

## 2013-09-19 LAB — GLUCOSE, CAPILLARY: Glucose-Capillary: 201 mg/dL — ABNORMAL HIGH (ref 70–99)

## 2013-09-19 LAB — URINE MICROSCOPIC-ADD ON

## 2013-09-19 MED ORDER — LISINOPRIL 10 MG PO TABS
10.0000 mg | ORAL_TABLET | Freq: Every day | ORAL | Status: DC
  Administered 2013-09-20 – 2013-09-21 (×2): 10 mg via ORAL
  Filled 2013-09-19 (×2): qty 1

## 2013-09-19 MED ORDER — VANCOMYCIN HCL IN DEXTROSE 1-5 GM/200ML-% IV SOLN
1000.0000 mg | Freq: Once | INTRAVENOUS | Status: AC
  Administered 2013-09-19: 1000 mg via INTRAVENOUS
  Filled 2013-09-19: qty 200

## 2013-09-19 MED ORDER — MORPHINE SULFATE 2 MG/ML IJ SOLN: 1.0000 mg | INTRAMUSCULAR | Status: DC | PRN

## 2013-09-19 MED ORDER — GABAPENTIN 600 MG PO TABS
600.0000 mg | ORAL_TABLET | Freq: Once | ORAL | Status: AC
  Administered 2013-09-19: 600 mg via ORAL
  Filled 2013-09-19: qty 1

## 2013-09-19 MED ORDER — METOPROLOL TARTRATE 25 MG PO TABS
25.0000 mg | ORAL_TABLET | Freq: Two times a day (BID) | ORAL | Status: DC
  Administered 2013-09-19 – 2013-09-21 (×4): 25 mg via ORAL
  Filled 2013-09-19 (×5): qty 1

## 2013-09-19 MED ORDER — HYDROCHLOROTHIAZIDE 25 MG PO TABS
25.0000 mg | ORAL_TABLET | Freq: Every day | ORAL | Status: DC
  Administered 2013-09-20 – 2013-09-21 (×2): 25 mg via ORAL
  Filled 2013-09-19 (×2): qty 1

## 2013-09-19 MED ORDER — ONDANSETRON HCL 4 MG/2ML IJ SOLN: 4.0000 mg | Freq: Four times a day (QID) | INTRAMUSCULAR | Status: DC | PRN

## 2013-09-19 MED ORDER — ONDANSETRON HCL 4 MG PO TABS: 4.0000 mg | ORAL_TABLET | Freq: Four times a day (QID) | ORAL | Status: DC | PRN

## 2013-09-19 MED ORDER — NICOTINE 21 MG/24HR TD PT24
21.0000 mg | MEDICATED_PATCH | Freq: Every day | TRANSDERMAL | Status: DC
  Administered 2013-09-19 – 2013-09-21 (×3): 21 mg via TRANSDERMAL
  Filled 2013-09-19 (×3): qty 1

## 2013-09-19 MED ORDER — ASPIRIN EC 81 MG PO TBEC
81.0000 mg | DELAYED_RELEASE_TABLET | Freq: Every day | ORAL | Status: DC
  Administered 2013-09-19 – 2013-09-20 (×2): 81 mg via ORAL
  Filled 2013-09-19 (×3): qty 1

## 2013-09-19 MED ORDER — HYDROCODONE-ACETAMINOPHEN 5-325 MG PO TABS
1.0000 | ORAL_TABLET | ORAL | Status: DC | PRN
  Administered 2013-09-20 – 2013-09-21 (×2): 2 via ORAL
  Filled 2013-09-19 (×2): qty 2

## 2013-09-19 MED ORDER — AMITRIPTYLINE HCL 100 MG PO TABS
100.0000 mg | ORAL_TABLET | Freq: Every day | ORAL | Status: DC
  Administered 2013-09-19 – 2013-09-20 (×2): 100 mg via ORAL
  Filled 2013-09-19 (×3): qty 1

## 2013-09-19 MED ORDER — HEPARIN SODIUM (PORCINE) 5000 UNIT/ML IJ SOLN
5000.0000 [IU] | Freq: Three times a day (TID) | INTRAMUSCULAR | Status: DC
  Administered 2013-09-19 – 2013-09-21 (×5): 5000 [IU] via SUBCUTANEOUS
  Filled 2013-09-19 (×9): qty 1

## 2013-09-19 MED ORDER — SODIUM CHLORIDE 0.9 % IV BOLUS (SEPSIS)
500.0000 mL | Freq: Once | INTRAVENOUS | Status: AC
  Administered 2013-09-19: 500 mL via INTRAVENOUS

## 2013-09-19 MED ORDER — DEXTROSE 5 % IV SOLN
1.0000 g | INTRAVENOUS | Status: DC
  Administered 2013-09-19 – 2013-09-20 (×2): 1 g via INTRAVENOUS
  Filled 2013-09-19 (×3): qty 10

## 2013-09-19 MED ORDER — PNEUMOCOCCAL VAC POLYVALENT 25 MCG/0.5ML IJ INJ
0.5000 mL | INJECTION | INTRAMUSCULAR | Status: AC
  Administered 2013-09-20: 0.5 mL via INTRAMUSCULAR
  Filled 2013-09-19: qty 0.5

## 2013-09-19 MED ORDER — INSULIN ASPART 100 UNIT/ML ~~LOC~~ SOLN
0.0000 [IU] | Freq: Three times a day (TID) | SUBCUTANEOUS | Status: DC
  Administered 2013-09-19: 5 [IU] via SUBCUTANEOUS
  Administered 2013-09-20: 8 [IU] via SUBCUTANEOUS
  Administered 2013-09-20 – 2013-09-21 (×4): 5 [IU] via SUBCUTANEOUS

## 2013-09-19 MED ORDER — SODIUM CHLORIDE 0.9 % IV SOLN
INTRAVENOUS | Status: DC
  Administered 2013-09-19 – 2013-09-20 (×2): 100 mL/h via INTRAVENOUS

## 2013-09-19 MED ORDER — GABAPENTIN 600 MG PO TABS
600.0000 mg | ORAL_TABLET | Freq: Three times a day (TID) | ORAL | Status: DC
  Administered 2013-09-19 – 2013-09-20 (×2): 600 mg via ORAL
  Filled 2013-09-19 (×4): qty 1

## 2013-09-19 MED ORDER — SODIUM CHLORIDE 0.9 % IJ SOLN
3.0000 mL | Freq: Two times a day (BID) | INTRAMUSCULAR | Status: DC
  Administered 2013-09-19 – 2013-09-21 (×2): 3 mL via INTRAVENOUS

## 2013-09-19 NOTE — ED Notes (Signed)
CBG: 305 
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
## 2013-09-19 NOTE — ED Notes (Signed)
Parker, PA at bedside for evaluation. 
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
## 2013-09-19 NOTE — ED Notes (Signed)
Bed placement, Robin notified, pt waiting for bed request, states will place bed request.
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
## 2013-09-19 NOTE — ED Notes (Signed)
CBG 229 following 500 ml NS bolus. Admitting MD informed.

## 2013-09-20 DIAGNOSIS — I10 Essential (primary) hypertension: Secondary | ICD-10-CM

## 2013-09-20 DIAGNOSIS — E1149 Type 2 diabetes mellitus with other diabetic neurological complication: Secondary | ICD-10-CM

## 2013-09-20 DIAGNOSIS — L03119 Cellulitis of unspecified part of limb: Principal | ICD-10-CM

## 2013-09-20 DIAGNOSIS — E1142 Type 2 diabetes mellitus with diabetic polyneuropathy: Secondary | ICD-10-CM

## 2013-09-20 DIAGNOSIS — L02619 Cutaneous abscess of unspecified foot: Principal | ICD-10-CM

## 2013-09-20 LAB — CBC
HEMATOCRIT: 35.9 % — AB (ref 36.0–46.0)
Hemoglobin: 11.8 g/dL — ABNORMAL LOW (ref 12.0–15.0)
MCH: 28.6 pg (ref 26.0–34.0)
MCHC: 32.9 g/dL (ref 30.0–36.0)
MCV: 86.9 fL (ref 78.0–100.0)
Platelets: 249 10*3/uL (ref 150–400)
RBC: 4.13 MIL/uL (ref 3.87–5.11)
RDW: 13.5 % (ref 11.5–15.5)
WBC: 6.8 10*3/uL (ref 4.0–10.5)

## 2013-09-20 LAB — GLUCOSE, CAPILLARY
GLUCOSE-CAPILLARY: 222 mg/dL — AB (ref 70–99)
Glucose-Capillary: 210 mg/dL — ABNORMAL HIGH (ref 70–99)
Glucose-Capillary: 258 mg/dL — ABNORMAL HIGH (ref 70–99)
Glucose-Capillary: 203 mg/dL — ABNORMAL HIGH (ref 70–99)

## 2013-09-20 LAB — COMPREHENSIVE METABOLIC PANEL
ALBUMIN: 3.2 g/dL — AB (ref 3.5–5.2)
ALK PHOS: 89 U/L (ref 39–117)
ALT: 24 U/L (ref 0–35)
AST: 19 U/L (ref 0–37)
Anion gap: 16 — ABNORMAL HIGH (ref 5–15)
BUN: 11 mg/dL (ref 6–23)
CHLORIDE: 100 meq/L (ref 96–112)
CO2: 22 mEq/L (ref 19–32)
Calcium: 8.7 mg/dL (ref 8.4–10.5)
Creatinine, Ser: 0.49 mg/dL — ABNORMAL LOW (ref 0.50–1.10)
GFR calc Af Amer: >90 mL/min (ref >90)
GFR calc non Af Amer: >90 mL/min (ref >90)
Glucose, Bld: 230 mg/dL — ABNORMAL HIGH (ref 70–99)
POTASSIUM: 3.7 meq/L (ref 3.7–5.3)
SODIUM: 138 meq/L (ref 137–147)
Total Protein: 6.8 g/dL (ref 6.0–8.3)

## 2013-09-20 MED ORDER — SILVER SULFADIAZINE 1 % EX CREA
TOPICAL_CREAM | Freq: Every day | CUTANEOUS | Status: DC
  Filled 2013-09-20: qty 85

## 2013-09-20 MED ORDER — GABAPENTIN 600 MG PO TABS
600.0000 mg | ORAL_TABLET | ORAL | Status: DC
  Administered 2013-09-20 – 2013-09-21 (×4): 600 mg via ORAL
  Filled 2013-09-20 (×4): qty 1

## 2013-09-20 MED ORDER — SILVER SULFADIAZINE 1 % EX CREA
TOPICAL_CREAM | Freq: Every day | CUTANEOUS | Status: DC
  Administered 2013-09-20 – 2013-09-21 (×2): via TOPICAL
  Filled 2013-09-20: qty 85

## 2013-09-20 MED ORDER — POTASSIUM CHLORIDE IN NACL 20-0.9 MEQ/L-% IV SOLN
INTRAVENOUS | Status: DC
  Administered 2013-09-20 (×2): via INTRAVENOUS
  Filled 2013-09-20 (×3): qty 1000

## 2013-09-20 NOTE — Progress Notes (Signed)
UR Completed Khyleigh Furney Graves-Bigelow, RN,BSN [phone]  
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
## 2013-09-20 NOTE — ED Provider Notes (Signed)
Medical screening examination/treatment/procedure(s) were conducted as a shared visit with non-physician practitioner(s) and myself.  I personally evaluated the patient during the encounter.   EKG Interpretation None      Failed outpatient therapy of cellulitis. Extending up leg now. Will place on vancomycin and admit to hospital. Pt with hx of DM  Lyanne CoKevin M Jeanifer Halliday, MD 09/20/13 [phone]
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
## 2013-09-21 LAB — CBC
HEMATOCRIT: 36.3 % (ref 36.0–46.0)
HEMOGLOBIN: 12 g/dL (ref 12.0–15.0)
MCH: 28.8 pg (ref 26.0–34.0)
MCHC: 33.1 g/dL (ref 30.0–36.0)
MCV: 87.1 fL (ref 78.0–100.0)
Platelets: 253 10*3/uL (ref 150–400)
RBC: 4.17 MIL/uL (ref 3.87–5.11)
RDW: 13.6 % (ref 11.5–15.5)
WBC: 7 10*3/uL (ref 4.0–10.5)

## 2013-09-21 LAB — BASIC METABOLIC PANEL
Anion gap: 18 — ABNORMAL HIGH (ref 5–15)
BUN: 8 mg/dL (ref 6–23)
CHLORIDE: 97 meq/L (ref 96–112)
CO2: 20 mEq/L (ref 19–32)
CREATININE: 0.52 mg/dL (ref 0.50–1.10)
Calcium: 8.8 mg/dL (ref 8.4–10.5)
GFR calc non Af Amer: >90 mL/min (ref >90)
GLUCOSE: 223 mg/dL — AB (ref 70–99)
POTASSIUM: 4.3 meq/L (ref 3.7–5.3)
Sodium: 135 mEq/L — ABNORMAL LOW (ref 137–147)

## 2013-09-21 LAB — GLUCOSE, CAPILLARY
GLUCOSE-CAPILLARY: 228 mg/dL — AB (ref 70–99)
GLUCOSE-CAPILLARY: 216 mg/dL — AB (ref 70–99)

## 2013-09-21 LAB — URINE CULTURE: Colony Count: 2000

## 2013-09-21 MED ORDER — NICOTINE 21 MG/24HR TD PT24: 21.0000 mg | MEDICATED_PATCH | Freq: Every day | TRANSDERMAL | Status: DC

## 2013-09-21 MED ORDER — INSULIN NPH ISOPHANE & REGULAR (70-30) 100 UNIT/ML ~~LOC~~ SUSP: 10.0000 [IU] | Freq: Two times a day (BID) | SUBCUTANEOUS | Status: DC

## 2013-09-21 MED ORDER — METFORMIN HCL 500 MG PO TABS: 500.0000 mg | ORAL_TABLET | Freq: Two times a day (BID) | ORAL | Status: DC

## 2013-09-21 MED ORDER — SULFAMETHOXAZOLE-TRIMETHOPRIM 400-80 MG PO TABS: 1.0000 | ORAL_TABLET | Freq: Two times a day (BID) | ORAL | Status: DC

## 2013-09-21 NOTE — Progress Notes (Signed)
  I have seen and examined the patient myself, and I have reviewed the note by Camila LiKaleb Keyserling, MS 4 and was present during the interview and physical exam.  Please see my separate progress note for additional findings, assessment, and plan.   Signed: Christen BameNora Shataria Crist, MD 09/21/2013, 1:24 PM
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
## 2013-09-23 NOTE — Progress Notes (Signed)
  I have seen and examined the patient, and reviewed the daily progress note by Camila LiKaleb Keyserling, MS 4 and discussed the care of the patient with them. Please see my progress note from 09/20/13 for further details regarding assessment and plan.    Signed:  Lorenda HatchetAdam L Keegan Ducey, MD 09/23/2013, 11:11 AM
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
## 2013-09-25 LAB — CULTURE, BLOOD (ROUTINE X 2)
CULTURE: NO GROWTH
Culture: NO GROWTH

## 2013-09-25 NOTE — ED Provider Notes (Signed)
Medical screening examination/treatment/procedure(s) were performed by non-physician practitioner and as supervising physician I was immediately available for consultation/collaboration.   EKG Interpretation None        Kyan Yurkovich N Manraj Yeo, DO 09/25/13 1713 

## 2013-11-12 ENCOUNTER — Encounter (HOSPITAL_COMMUNITY): Payer: Self-pay | Admitting: Emergency Medicine

## 2013-11-12 ENCOUNTER — Emergency Department (HOSPITAL_COMMUNITY)
Admission: EM | Admit: 2013-11-12 | Discharge: 2013-11-13 | Disposition: A | Discharge status: Home / Self Care | Payer: Self-pay | Attending: Emergency Medicine | Admitting: Emergency Medicine

## 2013-11-12 ENCOUNTER — Emergency Department (HOSPITAL_COMMUNITY): Payer: Self-pay

## 2013-11-12 DIAGNOSIS — E119 Type 2 diabetes mellitus without complications: Secondary | ICD-10-CM | POA: Insufficient documentation

## 2013-11-12 DIAGNOSIS — Z794 Long term (current) use of insulin: Secondary | ICD-10-CM | POA: Insufficient documentation

## 2013-11-12 DIAGNOSIS — R0789 Other chest pain: Secondary | ICD-10-CM | POA: Insufficient documentation

## 2013-11-12 DIAGNOSIS — Z8669 Personal history of other diseases of the nervous system and sense organs: Secondary | ICD-10-CM | POA: Insufficient documentation

## 2013-11-12 DIAGNOSIS — R079 Chest pain, unspecified: Secondary | ICD-10-CM

## 2013-11-12 DIAGNOSIS — R109 Unspecified abdominal pain: Secondary | ICD-10-CM | POA: Insufficient documentation

## 2013-11-12 DIAGNOSIS — I10 Essential (primary) hypertension: Secondary | ICD-10-CM | POA: Insufficient documentation

## 2013-11-12 DIAGNOSIS — Z79899 Other long term (current) drug therapy: Secondary | ICD-10-CM | POA: Insufficient documentation

## 2013-11-12 DIAGNOSIS — Z87891 Personal history of nicotine dependence: Secondary | ICD-10-CM | POA: Insufficient documentation

## 2013-11-12 LAB — BASIC METABOLIC PANEL
Anion gap: 21 — ABNORMAL HIGH (ref 5–15)
BUN: 21 mg/dL (ref 6–23)
CO2: 19 mEq/L (ref 19–32)
CREATININE: 0.66 mg/dL (ref 0.50–1.10)
Calcium: 9.4 mg/dL (ref 8.4–10.5)
Chloride: 96 mEq/L (ref 96–112)
GFR calc Af Amer: >90 mL/min (ref >90)
Glucose, Bld: 316 mg/dL — ABNORMAL HIGH (ref 70–99)
Potassium: 3.9 mEq/L (ref 3.7–5.3)
Sodium: 136 mEq/L — ABNORMAL LOW (ref 137–147)

## 2013-11-12 LAB — CBC WITH DIFFERENTIAL/PLATELET
BASOS PCT: 0 % (ref 0–1)
Basophils Absolute: 0 10*3/uL (ref 0.0–0.1)
EOS ABS: 0.1 10*3/uL (ref 0.0–0.7)
EOS PCT: 1 % (ref 0–5)
HEMATOCRIT: 35.3 % — AB (ref 36.0–46.0)
HEMOGLOBIN: 12 g/dL (ref 12.0–15.0)
Lymphocytes Relative: 38 % (ref 12–46)
Lymphs Abs: 3.3 10*3/uL (ref 0.7–4.0)
MCH: 29.3 pg (ref 26.0–34.0)
MCHC: 34 g/dL (ref 30.0–36.0)
MCV: 86.1 fL (ref 78.0–100.0)
MONO ABS: 0.5 10*3/uL (ref 0.1–1.0)
MONOS PCT: 5 % (ref 3–12)
Neutro Abs: 4.7 10*3/uL (ref 1.7–7.7)
Neutrophils Relative %: 56 % (ref 43–77)
Platelets: 232 10*3/uL (ref 150–400)
RBC: 4.1 MIL/uL (ref 3.87–5.11)
RDW: 14.6 % (ref 11.5–15.5)
WBC: 8.5 10*3/uL (ref 4.0–10.5)

## 2013-11-12 LAB — I-STAT TROPONIN, ED
Troponin i, poc: 0 ng/mL (ref 0.00–0.08)
Troponin i, poc: 0 ng/mL (ref 0.00–0.08)

## 2013-11-12 LAB — CBG MONITORING, ED: GLUCOSE-CAPILLARY: 235 mg/dL — AB (ref 70–99)

## 2013-11-12 MED ORDER — ASPIRIN 81 MG PO CHEW
324.0000 mg | CHEWABLE_TABLET | Freq: Once | ORAL | Status: AC
  Administered 2013-11-12: 324 mg via ORAL
  Filled 2013-11-12: qty 4

## 2013-11-12 MED ORDER — GABAPENTIN 600 MG PO TABS
600.0000 mg | ORAL_TABLET | Freq: Once | ORAL | Status: AC
  Administered 2013-11-12: 600 mg via ORAL
  Filled 2013-11-12: qty 1

## 2013-11-12 MED ORDER — SODIUM CHLORIDE 0.9 % IV BOLUS (SEPSIS)
1000.0000 mL | Freq: Once | INTRAVENOUS | Status: AC
  Administered 2013-11-12: 1000 mL via INTRAVENOUS

## 2013-11-12 NOTE — ED Provider Notes (Signed)
  This was a shared visit with a mid-level provided (NP or PA).  Throughout the patient's course I was available for consultation/collaboration.  I saw the ECG (if appropriate), relevant labs and studies - I agree with the interpretation.  On my exam the patient was in no distress.  She was awake alert, denying any ongoing pain. Patient received 1 L of IV fluids, and I advocated for more, but the patient requested discharge. With no ongoing pain, no evidence of distress, largely reassuring findings beyond her hyperglycemia, in a minor anion gap, most likely corrected with fluids, this was reasonable, and she will followup with her primary care physician.       EKG Interpretation   Date/Time:  Monday November 12 2013 19:52:47 EDT Ventricular Rate:  99 PR Interval:  170 QRS Duration: 84 QT Interval:  346 QTC Calculation: 444 R Axis:   13 Text Interpretation:  Sinus rhythm Left atrial enlargement Sinus rhythm  Left atrial enlargement Artifact Abnormal ekg Confirmed by Gerhard Munch  MD ([id number]) on 11/12/2013 8:04:23 PM         Gerhard Munch, MD 11/12/13 [phone]

## 2013-11-12 NOTE — ED Notes (Signed)
53 yo female from home with EMS with c/o LUQ pain. Sharp onset with burning pain. Per EMS pain was 10/10 with palp, no rigidity, no distention. Denies SOB and Chest Pain. Pt reports having gallbladder out. EKG WDL and vitals WDL. Pain now 0/10.

## 2013-11-12 NOTE — Discharge Instructions (Signed)

## 2013-11-12 NOTE — ED Notes (Signed)
Smoking hx just quit 55 days ago. Using [employer].

## 2013-11-12 NOTE — ED Provider Notes (Signed)
CSN: [id number]     Arrival date & time 11/12/13  1846 History   First MD Initiated Contact with Patient 11/12/13 1849     Chief Complaint  Patient presents with  . Abdominal Pain     (Consider location/radiation/quality/duration/timing/severity/associated sxs/prior Treatment) HPI Comments: Patient with past medical history of diabetes, hypertension, and hyperlipidemia, presents emergency department with chief complaint of left-sided chest pain. She states pain came on suddenly. It radiated to her left shoulder and arm. She reports associated shortness of breath, but denies any diaphoresis. She has no personal cardiac problems. She states that she has not been smoking for the past 2 months. He denies history of PE or DVT. She states the pain was severe, but has since resolved. She has not taken anything to alleviate her symptoms.  The history is provided by the patient. No language interpreter was used.    Past Medical History  Diagnosis Date  . Diabetes mellitus without complication   . Hypertension   . Bell's palsy 2005  . Neuromuscular disorder     NEUROPATHY   Past Surgical History  Procedure Laterality Date  . Cholecystectomy    . Cesarean section     No family history on file. History  Substance Use Topics  . Smoking status: Former Smoker -- 1.00 packs/day for 38 years    Types: Cigarettes    Quit date: 09/19/2013  . Smokeless tobacco: Never Used  . Alcohol Use: No   OB History   Grav Para Term Preterm Abortions TAB SAB Ect Mult Living                 Review of Systems  Constitutional: Negative for fever and chills.  Respiratory: Positive for shortness of breath.   Cardiovascular: Positive for chest pain.  Gastrointestinal: Negative for nausea, vomiting, diarrhea and constipation.  Genitourinary: Negative for dysuria.  All other systems reviewed and are negative.     Allergies  Review of patient's allergies indicates no known allergies.  Home Medications    Prior to Admission medications   Medication Sig Start Date End Date Taking? Authorizing Provider  amitriptyline (ELAVIL) 100 MG tablet Take 100 mg by mouth at bedtime.   Yes Historical Provider, MD  aspirin EC 81 MG tablet Take 81 mg by mouth at bedtime.    Yes Historical Provider, MD  gabapentin (NEURONTIN) 600 MG tablet Take 600 mg by mouth 3 (three) times daily.   Yes Historical Provider, MD  hydrochlorothiazide (HYDRODIURIL) 25 MG tablet Take 1 tablet (25 mg total) by mouth daily. 08/23/12  Yes Donita Brooks, MD  insulin NPH-regular Human (NOVOLIN 70/30) (70-30) 100 UNIT/ML injection Inject 10 Units into the skin 2 (two) times daily with a meal. 09/21/13  Yes Christen Bame, MD  lisinopril (PRINIVIL,ZESTRIL) 10 MG tablet Take 10 mg by mouth daily.   Yes Historical Provider, MD  metFORMIN (GLUCOPHAGE) 500 MG tablet Take 500 mg by mouth 3 (three) times daily.   Yes Historical Provider, MD  metoprolol tartrate (LOPRESSOR) 25 MG tablet Take 1 tablet (25 mg total) by mouth 2 (two) times daily. 08/23/12  Yes Donita Brooks, MD  neomycin-bacitracin-polymyxin (NEOSPORIN) OINT Apply 1 application topically 2 (two) times daily as needed for irritation or wound care.   Yes Historical Provider, MD   BP 153/97  Pulse 105  Temp(Src) 98 F (36.7 C) (Oral)  Resp 18  Ht  (1.626 m)  Wt 176 lb (79.833 kg)  BMI 30.20 kg/m2  SpO2 98%  Physical Exam  Nursing note and vitals reviewed. Constitutional: She is oriented to person, place, and time. She appears well-developed and well-nourished.  HENT:  Head: Normocephalic and atraumatic.  Eyes: Conjunctivae and EOM are normal. Pupils are equal, round, and reactive to light.  Neck: Normal range of motion. Neck supple.  Cardiovascular: Normal rate and regular rhythm.  Exam reveals no gallop and no friction rub.   No murmur heard. Pulmonary/Chest: Effort normal and breath sounds normal. No respiratory distress. She has no wheezes. She has no rales. She  exhibits no tenderness.  Abdominal: Soft. Bowel sounds are normal. She exhibits no distension and no mass. There is no tenderness. There is no rebound and no guarding.  Musculoskeletal: Normal range of motion. She exhibits no edema and no tenderness.  Neurological: She is alert and oriented to person, place, and time.  Skin: Skin is warm and dry.  Psychiatric: She has a normal mood and affect. Her behavior is normal. Judgment and thought content normal.    ED Course  Procedures (including critical care time) Results for orders placed during the hospital encounter of 11/12/13  CBC WITH DIFFERENTIAL      Result Value Ref Range   WBC 8.5  4.0 - 10.5 K/uL   RBC 4.10  3.87 - 5.11 MIL/uL   Hemoglobin 12.0  12.0 - 15.0 g/dL   HCT 16.1 (*) 09.6 - 04.5 %   MCV 86.1  78.0 - 100.0 fL   MCH 29.3  26.0 - 34.0 pg   MCHC 34.0  30.0 - 36.0 g/dL   RDW 40.9  81.1 - 91.4 %   Platelets 232  150 - 400 K/uL   Neutrophils Relative % 56  43 - 77 %   Neutro Abs 4.7  1.7 - 7.7 K/uL   Lymphocytes Relative 38  12 - 46 %   Lymphs Abs 3.3  0.7 - 4.0 K/uL   Monocytes Relative 5  3 - 12 %   Monocytes Absolute 0.5  0.1 - 1.0 K/uL   Eosinophils Relative 1  0 - 5 %   Eosinophils Absolute 0.1  0.0 - 0.7 K/uL   Basophils Relative 0  0 - 1 %   Basophils Absolute 0.0  0.0 - 0.1 K/uL  BASIC METABOLIC PANEL      Result Value Ref Range   Sodium 136 (*) 137 - 147 mEq/L   Potassium 3.9  3.7 - 5.3 mEq/L   Chloride 96  96 - 112 mEq/L   CO2 19  19 - 32 mEq/L   Glucose, Bld 316 (*) 70 - 99 mg/dL   BUN 21  6 - 23 mg/dL   Creatinine, Ser 7.82  0.50 - 1.10 mg/dL   Calcium 9.4  8.4 - 95.6 mg/dL   GFR calc non Af Amer >90  >90 mL/min   GFR calc Af Amer >90  >90 mL/min   Anion gap 21 (*) 5 - 15  I-STAT TROPOININ, ED      Result Value Ref Range   Troponin i, poc 0.00  0.00 - 0.08 ng/mL   Comment 3           I-STAT TROPOININ, ED      Result Value Ref Range   Troponin i, poc 0.00  0.00 - 0.08 ng/mL   Comment 3            CBG MONITORING, ED      Result Value Ref Range   Glucose-Capillary 235 (*) 70 - 99  mg/dL   Dg Chest 2 View  10/07/3662   CLINICAL DATA:  Chest pain  EXAM: CHEST  2 VIEW  COMPARISON:  None.  FINDINGS: The heart size and mediastinal contours are within normal limits. Both lungs are clear. The visualized skeletal structures are unremarkable.  IMPRESSION: No active cardiopulmonary disease.   Electronically Signed   By: Amie Portland M.D.   On: 11/12/2013 20:33     Imaging Review No results found.   EKG Interpretation   Date/Time:  Monday November 12 2013 19:52:47 EDT Ventricular Rate:  99 PR Interval:  170 QRS Duration: 84 QT Interval:  346 QTC Calculation: 444 R Axis:   13 Text Interpretation:  Sinus rhythm Left atrial enlargement Sinus rhythm  Left atrial enlargement Artifact Abnormal ekg Confirmed by Gerhard Munch  MD ([id number]) on 11/12/2013 8:04:23 PM      MDM   Final diagnoses:  Chest pain, unspecified chest pain type    Patient with sudden onset chest pain, shortness of breath. Pain radiated to the left arm. Will check labs, will reassess. Will treat with aspirin. Patient's pain is currently 0.  Labs, CXR, and EKG are reassuring.  She did have a moderate anion gap, but I have given fluids.  Patient feels well. Patient has been symptom free her entire ED stay.  Patient seen by and discussed with Dr. Jeraldine Loots, who agrees that the patient can be discharged.     Roxy Horseman, PA-C 11/12/13 2307

## 2014-02-15 ENCOUNTER — Other Ambulatory Visit: Payer: Self-pay | Admitting: Internal Medicine

## 2014-02-16 ENCOUNTER — Emergency Department (HOSPITAL_COMMUNITY)
Admission: EM | Admit: 2014-02-16 | Discharge: 2014-02-16 | Disposition: A | Discharge status: Home / Self Care | Payer: Self-pay | Attending: Emergency Medicine | Admitting: Emergency Medicine

## 2014-02-16 ENCOUNTER — Encounter (HOSPITAL_COMMUNITY): Payer: Self-pay | Admitting: *Deleted

## 2014-02-16 ENCOUNTER — Emergency Department (HOSPITAL_COMMUNITY): Payer: Self-pay

## 2014-02-16 DIAGNOSIS — Y9289 Other specified places as the place of occurrence of the external cause: Secondary | ICD-10-CM | POA: Insufficient documentation

## 2014-02-16 DIAGNOSIS — G629 Polyneuropathy, unspecified: Secondary | ICD-10-CM | POA: Insufficient documentation

## 2014-02-16 DIAGNOSIS — Y998 Other external cause status: Secondary | ICD-10-CM | POA: Insufficient documentation

## 2014-02-16 DIAGNOSIS — Z7982 Long term (current) use of aspirin: Secondary | ICD-10-CM | POA: Insufficient documentation

## 2014-02-16 DIAGNOSIS — Z791 Long term (current) use of non-steroidal anti-inflammatories (NSAID): Secondary | ICD-10-CM | POA: Insufficient documentation

## 2014-02-16 DIAGNOSIS — Y9389 Activity, other specified: Secondary | ICD-10-CM | POA: Insufficient documentation

## 2014-02-16 DIAGNOSIS — W1839XA Other fall on same level, initial encounter: Secondary | ICD-10-CM | POA: Insufficient documentation

## 2014-02-16 DIAGNOSIS — Z87891 Personal history of nicotine dependence: Secondary | ICD-10-CM | POA: Insufficient documentation

## 2014-02-16 DIAGNOSIS — E119 Type 2 diabetes mellitus without complications: Secondary | ICD-10-CM | POA: Insufficient documentation

## 2014-02-16 DIAGNOSIS — I10 Essential (primary) hypertension: Secondary | ICD-10-CM | POA: Insufficient documentation

## 2014-02-16 DIAGNOSIS — M25511 Pain in right shoulder: Secondary | ICD-10-CM

## 2014-02-16 DIAGNOSIS — Z79899 Other long term (current) drug therapy: Secondary | ICD-10-CM | POA: Insufficient documentation

## 2014-02-16 DIAGNOSIS — S4991XA Unspecified injury of right shoulder and upper arm, initial encounter: Secondary | ICD-10-CM | POA: Insufficient documentation

## 2014-02-16 DIAGNOSIS — Z794 Long term (current) use of insulin: Secondary | ICD-10-CM | POA: Insufficient documentation

## 2014-02-16 MED ORDER — NAPROXEN 500 MG PO TABS: 500.0000 mg | ORAL_TABLET | Freq: Two times a day (BID) | ORAL | Status: DC

## 2014-02-16 MED ORDER — HYDROCODONE-ACETAMINOPHEN 5-325 MG PO TABS: 1.0000 | ORAL_TABLET | Freq: Four times a day (QID) | ORAL | Status: DC | PRN

## 2014-02-16 NOTE — ED Notes (Signed)
Pt reports falling 5 weeks ago and having pain to right shoulder and arm since.

## 2014-02-16 NOTE — ED Provider Notes (Signed)
CSN: [id number]     Arrival date & time 02/16/14  1210 History  This chart was scribed for Arthor CaptainAbigail Puanani Gene, PA-C, working with Flint MelterElliott L Wentz, MD by Chestine SporeSoijett Blue, ED Scribe. The patient was seen in room TR05C/TR05C at 1:55 PM.    Chief Complaint  Patient presents with  . Fall     The history is provided by the patient. No language interpreter was used.   HPI Comments: Anna Wagner is a 53 y.o. female who presents to the Emergency Department complaining of fall onset 5 weeks ago. She fell because she has foot drop on the left foot. She reports that she fell 5 weeks ago and following had pain to the right shoulder and arm. She is unable to sleep on the shoulder or arm without pain. It is worsened with movement. She reports being right handed. She states that she has tried heat/cold with no relief for her symptoms. She denies weakness, and any other symptoms.  Past Medical History  Diagnosis Date  . Diabetes mellitus without complication   . Hypertension   . Bell's palsy 2005  . Neuromuscular disorder     NEUROPATHY   Past Surgical History  Procedure Laterality Date  . Cholecystectomy    . Cesarean section     History reviewed. No pertinent family history. History  Substance Use Topics  . Smoking status: Former Smoker -- 1.00 packs/day for 38 years    Types: Cigarettes    Quit date: 09/19/2013  . Smokeless tobacco: Never Used  . Alcohol Use: No   OB History    No data available     Review of Systems  Musculoskeletal: Positive for myalgias and arthralgias.  Neurological: Negative for weakness.      Allergies  Review of patient's allergies indicates no known allergies.  Home Medications   Prior to Admission medications   Medication Sig Start Date End Date Taking? Authorizing Provider  amitriptyline (ELAVIL) 100 MG tablet Take 100 mg by mouth at bedtime.   Yes Historical Provider, MD  aspirin EC 81 MG tablet Take 81 mg by mouth at bedtime.    Yes Historical Provider,  MD  gabapentin (NEURONTIN) 600 MG tablet Take 600 mg by mouth 3 (three) times daily.   Yes Historical Provider, MD  hydrochlorothiazide (HYDRODIURIL) 25 MG tablet Take 1 tablet (25 mg total) by mouth daily. 08/23/12  Yes Donita BrooksWarren T Pickard, MD  insulin NPH-regular Human (NOVOLIN 70/30) (70-30) 100 UNIT/ML injection Inject 10 Units into the skin 2 (two) times daily with a meal. Patient taking differently: Inject 15 Units into the skin 2 (two) times daily with a meal.  09/21/13  Yes Christen BameNora Sadek, MD  lisinopril (PRINIVIL,ZESTRIL) 10 MG tablet Take 10 mg by mouth daily.   Yes Historical Provider, MD  metFORMIN (GLUCOPHAGE) 500 MG tablet Take 500 mg by mouth 3 (three) times daily.   Yes Historical Provider, MD  metoprolol tartrate (LOPRESSOR) 25 MG tablet Take 1 tablet (25 mg total) by mouth 2 (two) times daily. Patient taking differently: Take 25 mg by mouth daily.  08/23/12  Yes Donita BrooksWarren T Pickard, MD  neomycin-bacitracin-polymyxin (NEOSPORIN) OINT Apply 1 application topically 2 (two) times daily as needed for irritation or wound care.   Yes Historical Provider, MD  sertraline (ZOLOFT) 100 MG tablet Take 100 mg by mouth daily.   Yes Historical Provider, MD  HYDROcodone-acetaminophen (NORCO) 5-325 MG per tablet Take 1-2 tablets by mouth every 6 (six) hours as needed for moderate pain. 02/16/14  Arthor CaptainAbigail Jeriah Corkum, PA-C  naproxen (NAPROSYN) 500 MG tablet Take 1 tablet (500 mg total) by mouth 2 (two) times daily with a meal. 02/16/14   Cheyeanne Roadcap, PA-C   BP 141/89 mmHg  Pulse 106  Temp(Src) 98.3 F (36.8 C) (Oral)  Resp 18  SpO2 95%  Physical Exam  Constitutional: She is oriented to person, place, and time. She appears well-developed and well-nourished. No distress.  HENT:  Head: Normocephalic and atraumatic.  Eyes: EOM are normal.  Neck: Neck supple. No tracheal deviation present.  Cardiovascular: Normal rate.   Pulmonary/Chest: Effort normal. No respiratory distress.  Musculoskeletal: Normal range  of motion.       Right shoulder: She exhibits tenderness. She exhibits no bony tenderness.  Tenderness in the left trapezius muscle. Pain with flexion of the right shoulder. Tender on the sort head tendon bicep exertion.   Neurological: She is alert and oriented to person, place, and time.  Skin: Skin is warm and dry.  Psychiatric: She has a normal mood and affect. Her behavior is normal.  Nursing note and vitals reviewed.   ED Course  Procedures (including critical care time) DIAGNOSTIC STUDIES: Oxygen Saturation is 95% on room air, normal by my interpretation.    COORDINATION OF CARE: 1:59 PM-Discussed treatment plan which includes right shoulder x-ray with pt at bedside and pt agreed to plan.   Labs Review Labs Reviewed - No data to display  Imaging Review Dg Shoulder Right  02/16/2014   CLINICAL DATA:  Right shoulder pain, fall several weeks ago  EXAM: RIGHT SHOULDER - 2+ VIEW  COMPARISON:  None.  FINDINGS: There is no evidence of fracture or dislocation. There is no evidence of arthropathy or other focal bone abnormality. Soft tissues are unremarkable. Mild AC joint degenerative change noted.  IMPRESSION: Negative.   Electronically Signed   By: Christiana PellantGretchen  Green M.D.   On: 02/16/2014 14:03     EKG Interpretation None      MDM   Final diagnoses:  Shoulder pain, right    Patient X-Ray negative for obvious fracture or dislocation. Pain managed in ED. Pt advised to follow up with orthopedics if symptoms persist for possibility of missed fracture diagnosis. Patient given brace while in ED, conservative therapy recommended and discussed. Patient will be dc home & is agreeable with above plan.   I personally performed the services described in this documentation, which was scribed in my presence. The recorded information has been reviewed and is accurate.    Arthor Captainbigail Kahealani Yankovich, PA-C 02/17/14 [phone]  Flint MelterElliott L Wentz, MD 02/17/14 [phone]

## 2014-02-16 NOTE — Discharge Instructions (Signed)
Your xray is negative. You may wear your sling for comfort.  Please take your arm out of the sling at least 5 times a day to move the shoulder joint. He denied UR at risk for developing a frozen shoulder. Please take the medications for pain as I have prescribed Do not drive, operate heavy machinery, drink alcohol, or take other tylenol containing products with this medicine.   Follow upWith an orthopedic doctor.   Shoulder Pain The shoulder is the joint that connects your arms to your body. The bones that form the shoulder joint include the upper arm bone (humerus), the shoulder blade (scapula), and the collarbone (clavicle). The top of the humerus is shaped like a ball and fits into a rather flat socket on the scapula (glenoid cavity). A combination of muscles and strong, fibrous tissues that connect muscles to bones (tendons) support your shoulder joint and hold the ball in the socket. Small, fluid-filled sacs (bursae) are located in different areas of the joint. They act as cushions between the bones and the overlying soft tissues and help reduce friction between the gliding tendons and the bone as you move your arm. Your shoulder joint allows a wide range of motion in your arm. This range of motion allows you to do things like scratch your back or throw a ball. However, this range of motion also makes your shoulder more prone to pain from overuse and injury. Causes of shoulder pain can originate from both injury and overuse and usually can be grouped in the following four categories:  Redness, swelling, and pain (inflammation) of the tendon (tendinitis) or the bursae (bursitis).  Instability, such as a dislocation of the joint.  Inflammation of the joint (arthritis).  Broken bone (fracture). HOME CARE INSTRUCTIONS   Apply ice to the sore area.  Put ice in a plastic bag.  Place a towel between your skin and the bag.  Leave the ice on for 15-20 minutes, 3-4 times per day for the first 2  days, or as directed by your health care provider.  Stop using cold packs if they do not help with the pain.  If you have a shoulder sling or immobilizer, wear it as long as your caregiver instructs. Only remove it to shower or bathe. Move your arm as little as possible, but keep your hand moving to prevent swelling.  Squeeze a soft ball or foam pad as much as possible to help prevent swelling.  Only take over-the-counter or prescription medicines for pain, discomfort, or fever as directed by your caregiver. SEEK MEDICAL CARE IF:   Your shoulder pain increases, or new pain develops in your arm, hand, or fingers.  Your hand or fingers become cold and numb.  Your pain is not relieved with medicines. SEEK IMMEDIATE MEDICAL CARE IF:   Your arm, hand, or fingers are numb or tingling.  Your arm, hand, or fingers are significantly swollen or turn white or blue. MAKE SURE YOU:   Understand these instructions.  Will watch your condition.  Will get help right away if you are not doing well or get worse. Document Released: 12/09/2004 Document Revised: 07/16/2013 Document Reviewed: 02/13/2011 [facility] Patient Information 2015 [employer] [place]. This information is not intended to replace advice given to you by your health care provider. Make sure you discuss any questions you have with your health care provider.

## 2014-02-21 ENCOUNTER — Other Ambulatory Visit: Payer: Self-pay | Admitting: Internal Medicine

## 2014-02-21 ENCOUNTER — Encounter (HOSPITAL_BASED_OUTPATIENT_CLINIC_OR_DEPARTMENT_OTHER): Payer: Self-pay | Attending: Internal Medicine

## 2014-02-21 ENCOUNTER — Ambulatory Visit (HOSPITAL_COMMUNITY)
Admission: RE | Admit: 2014-02-21 | Discharge: 2014-02-21 | Disposition: A | Discharge status: Home / Self Care | Payer: Self-pay | Source: Ambulatory Visit | Attending: Internal Medicine | Admitting: Internal Medicine

## 2014-02-21 DIAGNOSIS — E11621 Type 2 diabetes mellitus with foot ulcer: Secondary | ICD-10-CM | POA: Insufficient documentation

## 2014-02-21 DIAGNOSIS — L97529 Non-pressure chronic ulcer of other part of left foot with unspecified severity: Secondary | ICD-10-CM | POA: Insufficient documentation

## 2014-02-21 DIAGNOSIS — M868X7 Other osteomyelitis, ankle and foot: Secondary | ICD-10-CM | POA: Insufficient documentation

## 2014-02-21 DIAGNOSIS — M869 Osteomyelitis, unspecified: Secondary | ICD-10-CM

## 2014-02-22 NOTE — Progress Notes (Signed)
[facility]  NAME:  Anna Wagner, Anna Wagner                       ACCOUNT NO.:  MEDICAL RECORD NO.:  [id number]      DATE OF BIRTH:  [birth year]  PHYSICIAN:  Maxwell CaulMichael G. Robson, M.D. VISIT DATE:  02/21/2014                                  OFFICE VISIT   CHIEF COMPLAINT:  Review of a diabetic foot ulcer on the plantar left foot.  HISTORY OF PRESENT ILLNESS:  This is a patient who has type 2 diabetes, poorly controlled.  She also has diabetic neuropathy.  [place] soles of her feet, left greater than right.  [facility] early July [facility] 07/08 through 07/11 for this problem.  She was diagnosed with cellulitis and discharged on clindamycin.  I do not see that she had any positive cultures.  Her blood cultures were negative.  No vascular studies or x-rays that I can see.  I believe insulin was initiated at that point along with Janumet where her hemoglobin A1c was over 11.3.  On discussion with the patient and her mother today who are present, it does not sound like they are doing anything specific for this.  Mostly using Silvadene cream.  She is using her regular footwear.  She notes that this has been bleeding somewhat.  There has been no purulent drainage and no systemic symptoms including fever, chills, etc.  She tells me her hemoglobin A1c is down to just above 10.  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  PAST SURGICAL HISTORY:  C-section and cholecystectomy.  CURRENT MEDICATIONS: 1. Amitriptyline 100 mg at bedtime. 2. ASA 81 daily. 3. Gabapentin 600 three times a day. 4. Hydrochlorothiazide 25 daily. 5. Levemir insulin 15 units b.i.d. 6. Lisinopril 10 daily. 7. Metformin 500 b.i.d. 8. Zoloft 50 mg nightly.  PHYSICAL EXAMINATION:  VITAL SIGNS:  Temperature is 97.9, pulse  86, respirations 16, blood pressure 140/93.  Her ABIs measured in this clinic is 0.99. EXTREMITIES:  The plantar ulcer is roughly over the second and third metatarsal heads.  This measures 1.7 x 1.5 x 1.  There was thick surrounding callus and a nonviable eschar over the surface of the wound. The callus was removed, the eschar debrided.  A deep culture of this wound was done. RESPIRATORY:  Clear air entry bilaterally. CARDIAC:  Heart sounds are normal.  No murmurs. EXTREMITIES:  Absence of vibration sense in her feet compatible with her known diabetic neuropathy.  IMPRESSION/PLAN:  Wagner's grade 2 diabetic foot ulcer, possibly Wagner's grade 3.  This is in association with diabetic neuropathy and poorly-controlled type 2 diabetes.  The wound underwent debridement extensively, unfortunately this is a deep wound that is approaching her metatarsal heads.  I have ordered a plain x-ray of the area and it is quite likely the patient will require an MRI.  For today, we dressed this with silver alginate, foam, Kerlix wrap, and gave her an off- loading healing sandal.  As mentioned, it is quite likely that she is going to require an MRI to evaluate for underlying osteomyelitis in this wound that has been present for 5 months.  We will review  her again in a week's time.          ______________________________ Maxwell CaulMichael G. Robson, M.D.     MGR/MEDQ  D:  02/21/2014  T:  02/21/2014  Job:  [id number]

## 2014-03-14 ENCOUNTER — Emergency Department (HOSPITAL_COMMUNITY): Payer: Self-pay

## 2014-03-14 ENCOUNTER — Encounter (HOSPITAL_COMMUNITY): Payer: Self-pay | Admitting: Emergency Medicine

## 2014-03-14 ENCOUNTER — Inpatient Hospital Stay (HOSPITAL_COMMUNITY)
Admission: EM | Admit: 2014-03-14 | Discharge: 2014-03-17 | DRG: 617 | Disposition: A | Discharge status: Home Health | Payer: Self-pay | Attending: Internal Medicine | Admitting: Internal Medicine

## 2014-03-14 DIAGNOSIS — Z87891 Personal history of nicotine dependence: Secondary | ICD-10-CM

## 2014-03-14 DIAGNOSIS — L03119 Cellulitis of unspecified part of limb: Secondary | ICD-10-CM

## 2014-03-14 DIAGNOSIS — L97529 Non-pressure chronic ulcer of other part of left foot with unspecified severity: Secondary | ICD-10-CM

## 2014-03-14 DIAGNOSIS — T8149XA Infection following a procedure, other surgical site, initial encounter: Secondary | ICD-10-CM

## 2014-03-14 DIAGNOSIS — L03116 Cellulitis of left lower limb: Secondary | ICD-10-CM | POA: Diagnosis present

## 2014-03-14 DIAGNOSIS — E11621 Type 2 diabetes mellitus with foot ulcer: Principal | ICD-10-CM | POA: Diagnosis present

## 2014-03-14 DIAGNOSIS — F1290 Cannabis use, unspecified, uncomplicated: Secondary | ICD-10-CM | POA: Diagnosis present

## 2014-03-14 DIAGNOSIS — I10 Essential (primary) hypertension: Secondary | ICD-10-CM | POA: Diagnosis present

## 2014-03-14 DIAGNOSIS — L02612 Cutaneous abscess of left foot: Secondary | ICD-10-CM | POA: Diagnosis present

## 2014-03-14 DIAGNOSIS — Z791 Long term (current) use of non-steroidal anti-inflammatories (NSAID): Secondary | ICD-10-CM

## 2014-03-14 DIAGNOSIS — L97509 Non-pressure chronic ulcer of other part of unspecified foot with unspecified severity: Secondary | ICD-10-CM

## 2014-03-14 DIAGNOSIS — E1142 Type 2 diabetes mellitus with diabetic polyneuropathy: Secondary | ICD-10-CM | POA: Diagnosis present

## 2014-03-14 DIAGNOSIS — Z7982 Long term (current) use of aspirin: Secondary | ICD-10-CM

## 2014-03-14 DIAGNOSIS — L97429 Non-pressure chronic ulcer of left heel and midfoot with unspecified severity: Secondary | ICD-10-CM | POA: Diagnosis present

## 2014-03-14 DIAGNOSIS — T148XXA Other injury of unspecified body region, initial encounter: Secondary | ICD-10-CM

## 2014-03-14 DIAGNOSIS — L089 Local infection of the skin and subcutaneous tissue, unspecified: Secondary | ICD-10-CM | POA: Diagnosis present

## 2014-03-14 DIAGNOSIS — G609 Hereditary and idiopathic neuropathy, unspecified: Secondary | ICD-10-CM | POA: Diagnosis present

## 2014-03-14 DIAGNOSIS — E876 Hypokalemia: Secondary | ICD-10-CM | POA: Diagnosis not present

## 2014-03-14 DIAGNOSIS — E1140 Type 2 diabetes mellitus with diabetic neuropathy, unspecified: Secondary | ICD-10-CM | POA: Diagnosis present

## 2014-03-14 DIAGNOSIS — Z794 Long term (current) use of insulin: Secondary | ICD-10-CM

## 2014-03-14 DIAGNOSIS — T798XXA Other early complications of trauma, initial encounter: Secondary | ICD-10-CM

## 2014-03-14 DIAGNOSIS — E1149 Type 2 diabetes mellitus with other diabetic neurological complication: Secondary | ICD-10-CM

## 2014-03-14 DIAGNOSIS — E119 Type 2 diabetes mellitus without complications: Secondary | ICD-10-CM

## 2014-03-14 LAB — URINE MICROSCOPIC-ADD ON

## 2014-03-14 LAB — BASIC METABOLIC PANEL
ANION GAP: 13 (ref 5–15)
BUN: 20 mg/dL (ref 6–23)
CALCIUM: 9.2 mg/dL (ref 8.4–10.5)
CHLORIDE: 104 meq/L (ref 96–112)
CO2: 19 mmol/L (ref 19–32)
Creatinine, Ser: 0.6 mg/dL (ref 0.50–1.10)
GFR calc non Af Amer: >90 mL/min (ref >90)
Glucose, Bld: 160 mg/dL — ABNORMAL HIGH (ref 70–99)
Potassium: 4.2 mmol/L (ref 3.5–5.1)
SODIUM: 136 mmol/L (ref 135–145)

## 2014-03-14 LAB — URINALYSIS, ROUTINE W REFLEX MICROSCOPIC
BILIRUBIN URINE: NEGATIVE
Glucose, UA: >1000 mg/dL — AB
Ketones, ur: NEGATIVE mg/dL
NITRITE: NEGATIVE
PH: 5 (ref 5.0–8.0)
Protein, ur: NEGATIVE mg/dL
Specific Gravity, Urine: 1.027 (ref 1.005–1.030)
Urobilinogen, UA: 0.2 mg/dL (ref 0.0–1.0)

## 2014-03-14 LAB — CBC WITH DIFFERENTIAL/PLATELET
BASOS ABS: 0 10*3/uL (ref 0.0–0.1)
BASOS PCT: 0 % (ref 0–1)
Eosinophils Absolute: 0.4 10*3/uL (ref 0.0–0.7)
Eosinophils Relative: 4 % (ref 0–5)
HEMATOCRIT: 34.3 % — AB (ref 36.0–46.0)
Hemoglobin: 11.5 g/dL — ABNORMAL LOW (ref 12.0–15.0)
LYMPHS PCT: 28 % (ref 12–46)
Lymphs Abs: 2.7 10*3/uL (ref 0.7–4.0)
MCH: 28.5 pg (ref 26.0–34.0)
MCHC: 33.5 g/dL (ref 30.0–36.0)
MCV: 85.1 fL (ref 78.0–100.0)
MONO ABS: 0.8 10*3/uL (ref 0.1–1.0)
Monocytes Relative: 9 % (ref 3–12)
NEUTROS ABS: 5.8 10*3/uL (ref 1.7–7.7)
Neutrophils Relative %: 59 % (ref 43–77)
Platelets: 294 10*3/uL (ref 150–400)
RBC: 4.03 MIL/uL (ref 3.87–5.11)
RDW: 13.4 % (ref 11.5–15.5)
WBC: 9.7 10*3/uL (ref 4.0–10.5)

## 2014-03-14 LAB — I-STAT CG4 LACTIC ACID, ED: LACTIC ACID, VENOUS: 1.35 mmol/L (ref 0.5–2.2)

## 2014-03-14 LAB — ALBUMIN: Albumin: 3.4 g/dL — ABNORMAL LOW (ref 3.5–5.2)

## 2014-03-14 LAB — SEDIMENTATION RATE: Sed Rate: 115 mm/hr — ABNORMAL HIGH (ref 0–22)

## 2014-03-14 MED ORDER — PIPERACILLIN-TAZOBACTAM 3.375 G IVPB 30 MIN
3.3750 g | Freq: Once | INTRAVENOUS | Status: AC
  Administered 2014-03-14: 3.375 g via INTRAVENOUS
  Filled 2014-03-14: qty 50

## 2014-03-14 MED ORDER — AMITRIPTYLINE HCL 100 MG PO TABS
100.0000 mg | ORAL_TABLET | Freq: Every day | ORAL | Status: DC
  Administered 2014-03-14 – 2014-03-16 (×3): 100 mg via ORAL
  Filled 2014-03-14 (×5): qty 1

## 2014-03-14 MED ORDER — VANCOMYCIN HCL IN DEXTROSE 750-5 MG/150ML-% IV SOLN
750.0000 mg | Freq: Three times a day (TID) | INTRAVENOUS | Status: DC
  Filled 2014-03-14: qty 150

## 2014-03-14 MED ORDER — INSULIN DETEMIR 100 UNIT/ML ~~LOC~~ SOLN
15.0000 [IU] | Freq: Two times a day (BID) | SUBCUTANEOUS | Status: DC
  Administered 2014-03-14 – 2014-03-17 (×6): 15 [IU] via SUBCUTANEOUS
  Filled 2014-03-14 (×7): qty 0.15

## 2014-03-14 MED ORDER — SODIUM CHLORIDE 0.9 % IV SOLN
INTRAVENOUS | Status: DC
  Administered 2014-03-14 – 2014-03-16 (×3): via INTRAVENOUS

## 2014-03-14 MED ORDER — VANCOMYCIN HCL IN DEXTROSE 1-5 GM/200ML-% IV SOLN
1000.0000 mg | Freq: Two times a day (BID) | INTRAVENOUS | Status: DC
  Administered 2014-03-15 – 2014-03-17 (×5): 1000 mg via INTRAVENOUS
  Filled 2014-03-14 (×7): qty 200

## 2014-03-14 MED ORDER — OXYCODONE HCL 5 MG PO TABS: 5.0000 mg | ORAL_TABLET | ORAL | Status: DC | PRN

## 2014-03-14 MED ORDER — ACETAMINOPHEN 325 MG PO TABS
650.0000 mg | ORAL_TABLET | Freq: Four times a day (QID) | ORAL | Status: DC | PRN
  Administered 2014-03-15 – 2014-03-16 (×2): 650 mg via ORAL
  Filled 2014-03-14 (×2): qty 2

## 2014-03-14 MED ORDER — GABAPENTIN 300 MG PO CAPS
600.0000 mg | ORAL_CAPSULE | Freq: Three times a day (TID) | ORAL | Status: DC
  Administered 2014-03-14 – 2014-03-17 (×7): 600 mg via ORAL
  Filled 2014-03-14 (×11): qty 2

## 2014-03-14 MED ORDER — DEXTROSE 5 % IV SOLN
1.0000 g | Freq: Three times a day (TID) | INTRAVENOUS | Status: DC
  Administered 2014-03-14 – 2014-03-17 (×8): 1 g via INTRAVENOUS
  Filled 2014-03-14 (×11): qty 1

## 2014-03-14 MED ORDER — GABAPENTIN 600 MG PO TABS
600.0000 mg | ORAL_TABLET | Freq: Three times a day (TID) | ORAL | Status: DC
  Filled 2014-03-14: qty 1

## 2014-03-14 MED ORDER — MORPHINE SULFATE 2 MG/ML IJ SOLN: 1.0000 mg | INTRAMUSCULAR | Status: DC | PRN

## 2014-03-14 MED ORDER — ONDANSETRON HCL 4 MG PO TABS: 4.0000 mg | ORAL_TABLET | Freq: Four times a day (QID) | ORAL | Status: DC | PRN

## 2014-03-14 MED ORDER — ACETAMINOPHEN 650 MG RE SUPP
650.0000 mg | Freq: Four times a day (QID) | RECTAL | Status: DC | PRN
Start: 2014-03-14 — End: 2014-03-17

## 2014-03-14 MED ORDER — VANCOMYCIN HCL IN DEXTROSE 1-5 GM/200ML-% IV SOLN
1000.0000 mg | Freq: Once | INTRAVENOUS | Status: AC
  Administered 2014-03-14: 1000 mg via INTRAVENOUS
  Filled 2014-03-14: qty 200

## 2014-03-14 MED ORDER — ALBUTEROL SULFATE (2.5 MG/3ML) 0.083% IN NEBU: 2.5000 mg | INHALATION_SOLUTION | RESPIRATORY_TRACT | Status: DC | PRN

## 2014-03-14 MED ORDER — SODIUM CHLORIDE 0.9 % IV BOLUS (SEPSIS)
1000.0000 mL | Freq: Once | INTRAVENOUS | Status: AC
Start: 2014-03-14 — End: 2014-03-14
  Administered 2014-03-14: 1000 mL via INTRAVENOUS

## 2014-03-14 MED ORDER — ENOXAPARIN SODIUM 40 MG/0.4ML ~~LOC~~ SOLN
40.0000 mg | SUBCUTANEOUS | Status: DC
  Administered 2014-03-14 – 2014-03-16 (×3): 40 mg via SUBCUTANEOUS
  Filled 2014-03-14 (×5): qty 0.4

## 2014-03-14 MED ORDER — INFLUENZA VAC SPLIT QUAD 0.5 ML IM SUSY
0.5000 mL | PREFILLED_SYRINGE | INTRAMUSCULAR | Status: AC
  Administered 2014-03-16: 0.5 mL via INTRAMUSCULAR
  Filled 2014-03-14 (×3): qty 0.5

## 2014-03-14 MED ORDER — SERTRALINE HCL 100 MG PO TABS
100.0000 mg | ORAL_TABLET | Freq: Every day | ORAL | Status: DC
  Administered 2014-03-15 – 2014-03-16 (×2): 100 mg via ORAL
  Filled 2014-03-14 (×4): qty 1

## 2014-03-14 MED ORDER — INSULIN ASPART 100 UNIT/ML ~~LOC~~ SOLN
0.0000 [IU] | Freq: Three times a day (TID) | SUBCUTANEOUS | Status: DC
  Administered 2014-03-15: 2 [IU] via SUBCUTANEOUS
  Administered 2014-03-15: 11 [IU] via SUBCUTANEOUS
  Administered 2014-03-16: 5 [IU] via SUBCUTANEOUS
  Administered 2014-03-16: 2 [IU] via SUBCUTANEOUS
  Administered 2014-03-16: 5 [IU] via SUBCUTANEOUS
  Administered 2014-03-17: 8 [IU] via SUBCUTANEOUS
  Administered 2014-03-17: 3 [IU] via SUBCUTANEOUS

## 2014-03-14 MED ORDER — ONDANSETRON HCL 4 MG/2ML IJ SOLN: 4.0000 mg | Freq: Four times a day (QID) | INTRAMUSCULAR | Status: DC | PRN

## 2014-03-14 MED ORDER — METRONIDAZOLE IN NACL 5-0.79 MG/ML-% IV SOLN
500.0000 mg | Freq: Three times a day (TID) | INTRAVENOUS | Status: DC
  Administered 2014-03-14 – 2014-03-17 (×8): 500 mg via INTRAVENOUS
  Filled 2014-03-14 (×10): qty 100

## 2014-03-14 MED ORDER — INSULIN ASPART 100 UNIT/ML ~~LOC~~ SOLN
0.0000 [IU] | Freq: Every day | SUBCUTANEOUS | Status: DC
  Administered 2014-03-14: 3 [IU] via SUBCUTANEOUS
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
## 2014-03-14 NOTE — Progress Notes (Signed)
ANTIBIOTIC CONSULT NOTE - INITIAL  Pharmacy Consult for Vancomycin / Cefepime Indication: Suspected diabetic foot infection. High risk for MRSA.  No Known Allergies  Patient Measurements:   Adjusted Body Weight:   Vital Signs: Temp: 97.7 F (36.5 C) (12/31 1845) Temp Source: Oral (12/31 1845) BP: 129/85 mmHg (12/31 1845) Pulse Rate: 96 (12/31 1845) Intake/Output from previous day:   Intake/Output from this shift:    Labs:  Recent Labs  03/14/14 1639  WBC 9.7  HGB 11.5*  PLT 294  CREATININE 0.60   CrCl cannot be calculated (Unknown ideal weight.). No results for input(s): VANCOTROUGH, VANCOPEAK, VANCORANDOM, GENTTROUGH, GENTPEAK, GENTRANDOM, TOBRATROUGH, TOBRAPEAK, TOBRARND, AMIKACINPEAK, AMIKACINTROU, AMIKACIN in the last 72 hours.   Microbiology: No results found for this or any previous visit (from the past 720 hour(s)).  Medical History: Past Medical History  Diagnosis Date  . Diabetes mellitus without complication   . Hypertension   . Bell's palsy 2005  . Neuromuscular disorder     NEUROPATHY    Assessment: [age] yoF with PMHx DM, neuropathy, HTN, and Bell's palsy presents from wound care with suspected diabetic foot infection. Pt has received multiple courses for antibiotics for chronic wound ulcers to foot.  Recent X-ray showed fracture of 5th metatarsal, ruled out OM.    Pharmacy consulted to start Vancomycin and Cefepime. Vanc and zosyn x 1 given in ED.   12/31 >> Zosyn x1 12/31 >> Vancomycin  >> 12/31 >> Cefepime >>   12/31 >> Flagyl >>  Tmax: AF WBCs: WNL Renal: SCr 0.60, CrCl   12/31: Blood x2: P 12/31: Urine cx: Ordered (U/A - many bacteria)   Goal of Therapy:  Vancomycin trough 10-15 mcg/ml  Plan:  Vancomycin 1g IV q12h Cefepime 1g IV q8h F/u renal fxn, cultures, vanc trough as warranted  Haynes Hoehnolleen Hydeia Mcatee, PharmD, BCPS 03/14/2014, 8:03 PM  Pager: [phone]
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
## 2014-03-15 ENCOUNTER — Encounter (HOSPITAL_COMMUNITY)
Admission: EM | Disposition: A | Discharge status: Home Health | Payer: Self-pay | Source: Home / Self Care | Attending: Internal Medicine

## 2014-03-15 ENCOUNTER — Inpatient Hospital Stay (HOSPITAL_COMMUNITY): Payer: Self-pay | Admitting: Anesthesiology

## 2014-03-15 DIAGNOSIS — E876 Hypokalemia: Secondary | ICD-10-CM | POA: Diagnosis not present

## 2014-03-15 DIAGNOSIS — L02612 Cutaneous abscess of left foot: Secondary | ICD-10-CM | POA: Diagnosis present

## 2014-03-15 HISTORY — PX: AMPUTATION: SHX166

## 2014-03-15 LAB — BASIC METABOLIC PANEL
Anion gap: 9 (ref 5–15)
BUN: 19 mg/dL (ref 6–23)
CO2: 22 mmol/L (ref 19–32)
CREATININE: 0.67 mg/dL (ref 0.50–1.10)
Calcium: 8.3 mg/dL — ABNORMAL LOW (ref 8.4–10.5)
Chloride: 104 mEq/L (ref 96–112)
GFR calc Af Amer: >90 mL/min (ref >90)
GFR calc non Af Amer: >90 mL/min (ref >90)
Glucose, Bld: 266 mg/dL — ABNORMAL HIGH (ref 70–99)
Potassium: 3.2 mmol/L — ABNORMAL LOW (ref 3.5–5.1)
Sodium: 135 mmol/L (ref 135–145)

## 2014-03-15 LAB — GLUCOSE, CAPILLARY
GLUCOSE-CAPILLARY: 309 mg/dL — AB (ref 70–99)
GLUCOSE-CAPILLARY: 131 mg/dL — AB (ref 70–99)
GLUCOSE-CAPILLARY: 117 mg/dL — AB (ref 70–99)
Glucose-Capillary: 267 mg/dL — ABNORMAL HIGH (ref 70–99)
Glucose-Capillary: 102 mg/dL — ABNORMAL HIGH (ref 70–99)
Glucose-Capillary: 135 mg/dL — ABNORMAL HIGH (ref 70–99)

## 2014-03-15 LAB — CBC
HCT: 34.1 % — ABNORMAL LOW (ref 36.0–46.0)
HEMOGLOBIN: 10.9 g/dL — AB (ref 12.0–15.0)
MCH: 28 pg (ref 26.0–34.0)
MCHC: 32 g/dL (ref 30.0–36.0)
MCV: 87.7 fL (ref 78.0–100.0)
PLATELETS: 329 10*3/uL (ref 150–400)
RBC: 3.89 MIL/uL (ref 3.87–5.11)
RDW: 13.6 % (ref 11.5–15.5)
WBC: 8.4 10*3/uL (ref 4.0–10.5)

## 2014-03-15 LAB — MRSA PCR SCREENING: MRSA BY PCR: NEGATIVE

## 2014-03-15 SURGERY — AMPUTATION, FOOT, PARTIAL
Anesthesia: General | Site: Leg Lower | Laterality: Left

## 2014-03-15 MED ORDER — MIDAZOLAM HCL 5 MG/5ML IJ SOLN
INTRAMUSCULAR | Status: DC | PRN
  Administered 2014-03-15: 1 mg via INTRAVENOUS

## 2014-03-15 MED ORDER — METHOCARBAMOL 500 MG PO TABS
500.0000 mg | ORAL_TABLET | Freq: Four times a day (QID) | ORAL | Status: DC | PRN
  Administered 2014-03-16 – 2014-03-17 (×4): 500 mg via ORAL
  Filled 2014-03-15 (×4): qty 1

## 2014-03-15 MED ORDER — METOCLOPRAMIDE HCL 10 MG PO TABS: 5.0000 mg | ORAL_TABLET | Freq: Three times a day (TID) | ORAL | Status: DC | PRN

## 2014-03-15 MED ORDER — MIDAZOLAM HCL 2 MG/2ML IJ SOLN
INTRAMUSCULAR | Status: AC
  Filled 2014-03-15: qty 2

## 2014-03-15 MED ORDER — METOPROLOL TARTRATE 25 MG PO TABS: 25.0000 mg | ORAL_TABLET | Freq: Two times a day (BID) | ORAL | Status: DC

## 2014-03-15 MED ORDER — DOCUSATE SODIUM 100 MG PO CAPS
100.0000 mg | ORAL_CAPSULE | Freq: Two times a day (BID) | ORAL | Status: DC
  Administered 2014-03-15 – 2014-03-17 (×3): 100 mg via ORAL
  Filled 2014-03-15 (×5): qty 1

## 2014-03-15 MED ORDER — WHITE PETROLATUM GEL
Status: AC
  Administered 2014-03-15: 19:00:00
  Filled 2014-03-15: qty 5

## 2014-03-15 MED ORDER — HYDROCHLOROTHIAZIDE 25 MG PO TABS
25.0000 mg | ORAL_TABLET | Freq: Every day | ORAL | Status: DC
  Administered 2014-03-16 – 2014-03-17 (×2): 25 mg via ORAL
  Filled 2014-03-15 (×3): qty 1

## 2014-03-15 MED ORDER — LACTATED RINGERS IV SOLN
INTRAVENOUS | Status: DC | PRN
  Administered 2014-03-15: 17:00:00 via INTRAVENOUS

## 2014-03-15 MED ORDER — PROPOFOL 10 MG/ML IV BOLUS
INTRAVENOUS | Status: DC | PRN
  Administered 2014-03-15: 180 mg via INTRAVENOUS

## 2014-03-15 MED ORDER — METHOCARBAMOL 1000 MG/10ML IJ SOLN
500.0000 mg | Freq: Four times a day (QID) | INTRAVENOUS | Status: DC | PRN
  Filled 2014-03-15: qty 5

## 2014-03-15 MED ORDER — ONDANSETRON HCL 4 MG/2ML IJ SOLN
INTRAMUSCULAR | Status: DC | PRN
  Administered 2014-03-15: 4 mg via INTRAVENOUS

## 2014-03-15 MED ORDER — HYDROMORPHONE HCL 1 MG/ML IJ SOLN: 0.5000 mg | INTRAMUSCULAR | Status: DC | PRN

## 2014-03-15 MED ORDER — CHLORHEXIDINE GLUCONATE 4 % EX LIQD
60.0000 mL | Freq: Once | CUTANEOUS | Status: AC
  Administered 2014-03-15: 4 via TOPICAL
  Filled 2014-03-15: qty 60

## 2014-03-15 MED ORDER — METOCLOPRAMIDE HCL 5 MG/ML IJ SOLN: 5.0000 mg | Freq: Three times a day (TID) | INTRAMUSCULAR | Status: DC | PRN

## 2014-03-15 MED ORDER — PROPOFOL 10 MG/ML IV BOLUS
INTRAVENOUS | Status: AC
  Filled 2014-03-15: qty 20

## 2014-03-15 MED ORDER — ONDANSETRON HCL 4 MG PO TABS
4.0000 mg | ORAL_TABLET | Freq: Four times a day (QID) | ORAL | Status: DC | PRN
  Administered 2014-03-16: 4 mg via ORAL
  Filled 2014-03-15: qty 1

## 2014-03-15 MED ORDER — 0.9 % SODIUM CHLORIDE (POUR BTL) OPTIME
TOPICAL | Status: DC | PRN
  Administered 2014-03-15: 1000 mL

## 2014-03-15 MED ORDER — LISINOPRIL 10 MG PO TABS
10.0000 mg | ORAL_TABLET | Freq: Every day | ORAL | Status: DC
  Administered 2014-03-16 – 2014-03-17 (×2): 10 mg via ORAL
  Filled 2014-03-15 (×3): qty 1

## 2014-03-15 MED ORDER — FENTANYL CITRATE 0.05 MG/ML IJ SOLN
INTRAMUSCULAR | Status: DC | PRN
  Administered 2014-03-15 (×2): 50 ug via INTRAVENOUS

## 2014-03-15 MED ORDER — OXYCODONE-ACETAMINOPHEN 5-325 MG PO TABS
1.0000 | ORAL_TABLET | ORAL | Status: DC | PRN
  Administered 2014-03-15 – 2014-03-16 (×3): 1 via ORAL
  Administered 2014-03-16 – 2014-03-17 (×2): 2 via ORAL
  Filled 2014-03-15 (×2): qty 1
  Filled 2014-03-15 (×2): qty 2
  Filled 2014-03-15: qty 1

## 2014-03-15 MED ORDER — ONDANSETRON HCL 4 MG/2ML IJ SOLN: 4.0000 mg | Freq: Four times a day (QID) | INTRAMUSCULAR | Status: DC | PRN

## 2014-03-15 MED ORDER — LIDOCAINE HCL (CARDIAC) 20 MG/ML IV SOLN
INTRAVENOUS | Status: DC | PRN
  Administered 2014-03-15: 50 mg via INTRAVENOUS

## 2014-03-15 MED ORDER — FENTANYL CITRATE 0.05 MG/ML IJ SOLN
INTRAMUSCULAR | Status: AC
Start: 2014-03-15 — End: 2014-03-15
  Filled 2014-03-15: qty 5

## 2014-03-15 MED ORDER — ASPIRIN EC 81 MG PO TBEC
81.0000 mg | DELAYED_RELEASE_TABLET | Freq: Every day | ORAL | Status: DC
  Administered 2014-03-15 – 2014-03-16 (×2): 81 mg via ORAL
  Filled 2014-03-15 (×4): qty 1

## 2014-03-15 SURGICAL SUPPLY — 29 items
BLADE SAW SGTL HD 18.5X60.5X1. (BLADE) ×3 IMPLANT
BLADE SURG 10 STRL SS (BLADE) IMPLANT
BNDG COHESIVE 4X5 TAN STRL (GAUZE/BANDAGES/DRESSINGS) ×3 IMPLANT
BNDG GAUZE ELAST 4 BULKY (GAUZE/BANDAGES/DRESSINGS) ×3 IMPLANT
COVER SURGICAL LIGHT HANDLE (MISCELLANEOUS) ×3 IMPLANT
DRAPE U-SHAPE 47X51 STRL (DRAPES) ×3 IMPLANT
DRSG ADAPTIC 3X8 NADH LF (GAUZE/BANDAGES/DRESSINGS) ×3 IMPLANT
DRSG PAD ABDOMINAL 8X10 ST (GAUZE/BANDAGES/DRESSINGS) ×3 IMPLANT
DURAPREP 26ML APPLICATOR (WOUND CARE) ×3 IMPLANT
ELECT REM PT RETURN 9FT ADLT (ELECTROSURGICAL) ×3
ELECTRODE REM PT RTRN 9FT ADLT (ELECTROSURGICAL) ×1 IMPLANT
GAUZE SPONGE 4X4 12PLY STRL (GAUZE/BANDAGES/DRESSINGS) ×3 IMPLANT
GLOVE BIOGEL PI IND STRL 9 (GLOVE) ×1 IMPLANT
GLOVE BIOGEL PI INDICATOR 9 (GLOVE) ×2
GLOVE SURG ORTHO 9.0 STRL STRW (GLOVE) ×3 IMPLANT
GOWN STRL REUS W/ TWL XL LVL3 (GOWN DISPOSABLE) ×3 IMPLANT
GOWN STRL REUS W/TWL XL LVL3 (GOWN DISPOSABLE) ×6
KIT BASIN OR (CUSTOM PROCEDURE TRAY) ×3 IMPLANT
KIT ROOM TURNOVER OR (KITS) ×3 IMPLANT
NS IRRIG 1000ML POUR BTL (IV SOLUTION) ×3 IMPLANT
PACK ORTHO EXTREMITY (CUSTOM PROCEDURE TRAY) ×3 IMPLANT
PAD ARMBOARD 7.5X6 YLW CONV (MISCELLANEOUS) ×6 IMPLANT
SPONGE GAUZE 4X4 12PLY STER LF (GAUZE/BANDAGES/DRESSINGS) ×3 IMPLANT
SPONGE LAP 18X18 X RAY DECT (DISPOSABLE) ×3 IMPLANT
SUT ETHILON 2 0 PSLX (SUTURE) ×9 IMPLANT
SUT VIC AB 2-0 CTB1 (SUTURE) IMPLANT
TOWEL OR 17X24 6PK STRL BLUE (TOWEL DISPOSABLE) ×3 IMPLANT
TOWEL OR 17X26 10 PK STRL BLUE (TOWEL DISPOSABLE) ×3 IMPLANT
WATER STERILE IRR 1000ML POUR (IV SOLUTION) ×3 IMPLANT

## 2014-03-15 NOTE — Transfer of Care (Signed)
Immediate Anesthesia Transfer of Care Note  Patient: Anna Wagner  Procedure(s) Performed: Procedure(s): AMPUTATION TRANSMETATARSAL (Left)  Patient Location: PACU  Anesthesia Type:General  Level of Consciousness: awake and alert   Airway & Oxygen Therapy: Patient Spontanous Breathing and Patient connected to nasal cannula oxygen  Post-op Assessment: Report given to PACU RN and Post -op Vital signs reviewed and stable  Post vital signs: stable  Complications: No apparent anesthesia complications

## 2014-03-15 NOTE — Progress Notes (Signed)
Inpatient Diabetes Program Recommendations  AACE/ADA: New Consensus Statement on Inpatient Glycemic Control (2013)  Target Ranges:  Prepandial:   less than 140 mg/dL      Peak postprandial:   less than 180 mg/dL (1-2 hours)      Critically ill patients:  140 - 180 mg/dL   Reason for Visit: Hypeglycemia  Diabetes history: DM2 Outpatient Diabetes medications: Levemir 15 units bid, metformin 500 bid Current orders for Inpatient glycemic control: Levemir 15 units bid and Novolog moderate tidwc and hs   54 year old female admitted with infected L foot, cellulitis. Need updated HgbA1C to assess glycemic control at home.   Results for TANGIA, PINARD (MRN [id number]) as of 03/15/2014 10:13  Ref. Range 09/21/2013 07:45 09/21/2013 12:06 11/12/2013 22:41 03/14/2014 21:31 03/15/2014 07:28  Glucose-Capillary Latest Range: 70-99 mg/dL 562 (H) 130 (H) 865 (H) 267 (H) 309 (H)  Results for PENELOPE, FITTRO (MRN [id number]) as of 03/15/2014 10:13  Ref. Range 03/15/2014 04:35  Glucose Latest Range: 70-99 mg/dL 284 (H)   Poorly-controlled DM. Has PCP - will need f/u when discharged for management of DM.  Recommendations: Check HgbA1C to assess glycemic control prior to hospitalization Add meal coverage insulin - Novolog 4 units tidwc. Titrate until CBGs < 180 mg/dL If FBS >132 mg/dL, increase Levemir. Check daily. Will need f/u with PCP for diabetes management to improve glycemic control.  Thank you. Ailene Ards, RD, LDN, CDE Inpatient Diabetes Coordinator [phone]

## 2014-03-15 NOTE — Progress Notes (Signed)
Patient transferred to [facility] via [facility]. Patient in no distress and denies having any pain. Patient and patient's mother still have questions regarding surgical procedure therefore consent was not obtained.

## 2014-03-15 NOTE — Progress Notes (Signed)
[facility] PROGRESS NOTE  ROBINETTE Wagner ZOX:[id number] DOB: 04-18-1960 DOA: 03/14/2014 PCP: Tomi Bamberger, NP  Brief narrative 54 year old female with history of uncontrolled diabetes, diabetic neuropathy, hypertension who sustained an injury on the plantar aspect of her left foot or [place] the [place]. Following which she developed chronic wounds on the foot and was being seen by her PCP. She then developed chronic wound and ulcer on that area and was treated with multiple courses of antibiotic for infection. She was hospitalized in July for IV antibiotics. She was then seen at the wound center earlier this month with an x-ray done which was negative for similar days. She was however found to have a fracture at the base of the fifth metatarsal and a cast was placed on that foot. The cost was removed 1 week back. When she went for follow-up on the day of admission they found a wound with foul-smelling discharge on the dorsum surface of the base of the second and third toe. Patient admitted for IV antibiotic and orthopedic consult. X-ray negative for osteomyelitis. Patient denies any pain, fever, chills, nausea or vomiting.  Assessment/Plan: Cellulitis of the left foot and abscess Patient on empiric vancomycin, by mouth and Flagyl. She also has a deep ulcer over the plantar aspect of left foot.  Seen by orthopedic consult who recommends this is likely an abscess with underlying osteomyelitis. He plans on once metatarsal amputation of the left foot given extensive cellulitis and abscess along with osteomyelitis. Patient will be transferred to [facility] for OR possibly today.  Controlled type 2 diabetes mellitus Continue home dose Levemir. Hold metformin. Insulin A1c pending. Continue sliding scale insulin  Hypertension Blood pressure stable. I will resume her home blood pressure medications (lisinopril, HCTZ and metoprolol.)  Diabetic neuropathy Continue Neurontin and amitriptyline  ?  UTI Urine culture ordered. Continue empiric antibiotics  Hypokalemia Replenished  DVT prophylaxis: Subcutaneous Lovenox  Diet: Nothing by mouth for OR   Code Status: Full code Family Communication: Husband at  bedside Disposition Plan: tx to , sigh out give to my partner Dr Benjamine Mola.   Consultants:    Procedures:    Antibiotics:  IV vanco, cefepime and  Flagyl  HPI/Subjective: Since seen and examined. Admission H&P reviewed. Denies any pain over the left foot. Patient tearful that she will need amputation of the foot.  Objective: Filed Vitals:   03/15/14 0532  BP: 132/74  Pulse: 90  Temp: 97.8 F (36.6 C)  Resp: 18    Intake/Output Summary (Last 24 hours) at 03/15/14 1138 Last data filed at 03/15/14 0515  Gross per 24 hour  Intake 863.33 ml  Output      0 ml  Net 863.33 ml   Filed Weights   03/14/14 1949  Weight: 79.198 kg (174 lb 9.6 oz)    Exam:   General:  Middle aged female in no acute distress  HEENT: No pallor, moist oral mucosa  Chest: Clear to auscultation bilaterally  CVS: Normal S1 and S2, no murmurs  Abdomen: Soft, nontender, nondistended  Extremities: Dressing over the left foot, cellulitis with foul-smelling discharge over the dorsum of the second and third foot, deep through and through ulcer over the center aspect of the left foot    Data Reviewed: Basic Metabolic Panel:  Recent Labs Lab 03/14/14 1639 03/15/14 0435  NA 136 135  K 4.2 3.2*  CL 104 104  CO2 19 22  GLUCOSE 160* 266*  BUN 20 19  CREATININE 0.60 0.67  CALCIUM 9.2 8.3*   Liver Function Tests:  Recent Labs Lab 03/14/14 1950  ALBUMIN 3.4*   No results for input(s): LIPASE, AMYLASE in the last 168 hours. No results for input(s): AMMONIA in the last 168 hours. CBC:  Recent Labs Lab 03/14/14 1639 03/15/14 0435  WBC 9.7 8.4  NEUTROABS 5.8  --   HGB 11.5* 10.9*  HCT 34.3* 34.1*  MCV 85.1 87.7  PLT 294 329   Cardiac Enzymes: No results  for input(s): CKTOTAL, CKMB, CKMBINDEX, TROPONINI in the last 168 hours. BNP (last 3 results) No results for input(s): PROBNP in the last 8760 hours. CBG:  Recent Labs Lab 03/14/14 2131 03/15/14 0728  GLUCAP 267* 309*    No results found for this or any previous visit (from the past 240 hour(s)).   Studies: Dg Foot Complete Left  03/14/2014   CLINICAL DATA:  Nonhealing foot wound.  Diabetic.  EXAM: LEFT FOOT - COMPLETE 3+ VIEW  COMPARISON:  02/21/2014.  FINDINGS: The previously demonstrated fracture of the base of the fifth metatarsal is no longer seen. Large calcaneal spurs are again demonstrated. No soft tissue gas, bone destruction or periosteal reaction. Mild diffuse dorsal soft tissue swelling.  IMPRESSION: Stable large calcaneal spurs. No radiographic evidence of osteomyelitis.   Electronically Signed   By: Gordan Payment M.D.   On: 03/14/2014 16:10    Scheduled Meds: . amitriptyline  100 mg Oral QHS  . ceFEPime (MAXIPIME) IV  1 g Intravenous 3 times per day  . enoxaparin (LOVENOX) injection  40 mg Subcutaneous Q24H  . gabapentin  600 mg Oral TID  . Influenza vac split quadrivalent PF  0.5 mL Intramuscular Tomorrow-1000  . insulin aspart  0-15 Units Subcutaneous TID WC  . insulin aspart  0-5 Units Subcutaneous QHS  . insulin detemir  15 Units Subcutaneous BID  . metronidazole  500 mg Intravenous Q8H  . sertraline  100 mg Oral Daily  . vancomycin  1,000 mg Intravenous Q12H   Continuous Infusions: . sodium chloride 100 mL/hr at 03/14/14 2037      Time spent: 25 minutes    Eddie North  [facility] Pager [phone] 7PM-7AM, please contact night-coverage at [email or web address], password [facility] 03/15/2014, 11:38 AM  LOS: 1 day

## 2014-03-15 NOTE — Consult Note (Signed)
Reason for Consult: Abscess osteomyelitis ulceration left forefoot Referring Physician: Dr. Vanita Panda Rabanal is an 54 y.o. female.  HPI: Patient is a 54 year old woman with diabetic insensate neuropathy she previously burned both feet however had persistent ulceration beneath the second and third metatarsal head left foot. Patient was treated in serial total contact cast and developed an ulcer dorsally and was brought to the hospital for management and treatment of the plantar and dorsal ulcer.  Past Medical History  Diagnosis Date  . Diabetes mellitus without complication   . Hypertension   . Bell's palsy 2005  . Neuromuscular disorder     NEUROPATHY    Past Surgical History  Procedure Laterality Date  . Cholecystectomy    . Cesarean section      Family History  Problem Relation Age of Onset  . Heart attack Mother     Social History:  reports that she quit smoking about 5 months ago. Her smoking use included Cigarettes. She has a 38 pack-year smoking history. She has never used smokeless tobacco. She reports that she does not drink alcohol or use illicit drugs.  Allergies: No Known Allergies  Medications: I have reviewed the patient's current medications.  Results for orders placed or performed during the hospital encounter of 03/14/14 (from the past 48 hour(s))  Basic metabolic panel     Status: Abnormal   Collection Time: 03/14/14  4:39 PM  Result Value Ref Range   Sodium 136 135 - 145 mmol/L    Comment: Please note change in reference range.   Potassium 4.2 3.5 - 5.1 mmol/L    Comment: Please note change in reference range.   Chloride 104 96 - 112 mEq/L   CO2 19 19 - 32 mmol/L   Glucose, Bld 160 (H) 70 - 99 mg/dL   BUN 20 6 - 23 mg/dL   Creatinine, Ser 0.60 0.50 - 1.10 mg/dL   Calcium 9.2 8.4 - 10.5 mg/dL   GFR calc non Af Amer >90 >90 mL/min   GFR calc Af Amer >90 >90 mL/min    Comment: (NOTE) The eGFR has been calculated using the CKD EPI equation. This  calculation has not been validated in all clinical situations. eGFR's persistently <90 mL/min signify possible Chronic Kidney Disease.    Anion gap 13 5 - 15  CBC with Differential     Status: Abnormal   Collection Time: 03/14/14  4:39 PM  Result Value Ref Range   WBC 9.7 4.0 - 10.5 K/uL   RBC 4.03 3.87 - 5.11 MIL/uL   Hemoglobin 11.5 (L) 12.0 - 15.0 g/dL   HCT 34.3 (L) 36.0 - 46.0 %   MCV 85.1 78.0 - 100.0 fL   MCH 28.5 26.0 - 34.0 pg   MCHC 33.5 30.0 - 36.0 g/dL   RDW 13.4 11.5 - 15.5 %   Platelets 294 150 - 400 K/uL   Neutrophils Relative % 59 43 - 77 %   Neutro Abs 5.8 1.7 - 7.7 K/uL   Lymphocytes Relative 28 12 - 46 %   Lymphs Abs 2.7 0.7 - 4.0 K/uL   Monocytes Relative 9 3 - 12 %   Monocytes Absolute 0.8 0.1 - 1.0 K/uL   Eosinophils Relative 4 0 - 5 %   Eosinophils Absolute 0.4 0.0 - 0.7 K/uL   Basophils Relative 0 0 - 1 %   Basophils Absolute 0.0 0.0 - 0.1 K/uL  I-Stat CG4 Lactic Acid, ED     Status: None  Collection Time: 03/14/14  4:59 PM  Result Value Ref Range   Lactic Acid, Venous 1.35 0.5 - 2.2 mmol/L  Urinalysis, Routine w reflex microscopic     Status: Abnormal   Collection Time: 03/14/14  6:00 PM  Result Value Ref Range   Color, Urine YELLOW YELLOW   APPearance CLOUDY (A) CLEAR   Specific Gravity, Urine 1.027 1.005 - 1.030   pH 5.0 5.0 - 8.0   Glucose, UA >1000 (A) NEGATIVE mg/dL   Hgb urine dipstick TRACE (A) NEGATIVE   Bilirubin Urine NEGATIVE NEGATIVE   Ketones, ur NEGATIVE NEGATIVE mg/dL   Protein, ur NEGATIVE NEGATIVE mg/dL   Urobilinogen, UA 0.2 0.0 - 1.0 mg/dL   Nitrite NEGATIVE NEGATIVE   Leukocytes, UA SMALL (A) NEGATIVE  Urine microscopic-add on     Status: Abnormal   Collection Time: 03/14/14  6:00 PM  Result Value Ref Range   Squamous Epithelial / LPF RARE RARE   WBC, UA 3-6 <3 WBC/hpf   RBC / HPF 0-2 <3 RBC/hpf   Bacteria, UA MANY (A) RARE  Sedimentation rate     Status: Abnormal   Collection Time: 03/14/14  7:50 PM  Result Value  Ref Range   Sed Rate 115 (H) 0 - 22 mm/hr  Albumin     Status: Abnormal   Collection Time: 03/14/14  7:50 PM  Result Value Ref Range   Albumin 3.4 (L) 3.5 - 5.2 g/dL  Glucose, capillary     Status: Abnormal   Collection Time: 03/14/14  9:31 PM  Result Value Ref Range   Glucose-Capillary 267 (H) 70 - 99 mg/dL   Comment 1 Notify RN   CBC     Status: Abnormal   Collection Time: 03/15/14  4:35 AM  Result Value Ref Range   WBC 8.4 4.0 - 10.5 K/uL   RBC 3.89 3.87 - 5.11 MIL/uL   Hemoglobin 10.9 (L) 12.0 - 15.0 g/dL   HCT 34.1 (L) 36.0 - 46.0 %   MCV 87.7 78.0 - 100.0 fL   MCH 28.0 26.0 - 34.0 pg   MCHC 32.0 30.0 - 36.0 g/dL   RDW 13.6 11.5 - 15.5 %   Platelets 329 150 - 400 K/uL  Basic metabolic panel     Status: Abnormal   Collection Time: 03/15/14  4:35 AM  Result Value Ref Range   Sodium 135 135 - 145 mmol/L    Comment: Please note change in reference range.   Potassium 3.2 (L) 3.5 - 5.1 mmol/L    Comment: Please note change in reference range. DELTA CHECK NOTED    Chloride 104 96 - 112 mEq/L   CO2 22 19 - 32 mmol/L   Glucose, Bld 266 (H) 70 - 99 mg/dL   BUN 19 6 - 23 mg/dL   Creatinine, Ser 0.67 0.50 - 1.10 mg/dL   Calcium 8.3 (L) 8.4 - 10.5 mg/dL   GFR calc non Af Amer >90 >90 mL/min   GFR calc Af Amer >90 >90 mL/min    Comment: (NOTE) The eGFR has been calculated using the CKD EPI equation. This calculation has not been validated in all clinical situations. eGFR's persistently <90 mL/min signify possible Chronic Kidney Disease.    Anion gap 9 5 - 15  Glucose, capillary     Status: Abnormal   Collection Time: 03/15/14  7:28 AM  Result Value Ref Range   Glucose-Capillary 309 (H) 70 - 99 mg/dL    Dg Foot Complete Left  03/14/2014  CLINICAL DATA:  Nonhealing foot wound.  Diabetic.  EXAM: LEFT FOOT - COMPLETE 3+ VIEW  COMPARISON:  02/21/2014.  FINDINGS: The previously demonstrated fracture of the base of the fifth metatarsal is no longer seen. Large calcaneal spurs  are again demonstrated. No soft tissue gas, bone destruction or periosteal reaction. Mild diffuse dorsal soft tissue swelling.  IMPRESSION: Stable large calcaneal spurs. No radiographic evidence of osteomyelitis.   Electronically Signed   By: Enrique Sack M.D.   On: 03/14/2014 16:10    Review of Systems  All other systems reviewed and are negative.  Blood pressure 132/74, pulse 90, temperature 97.8 F (36.6 C), temperature source Oral, resp. rate 18, height '5\' 4"'  (1.626 m), weight 79.198 kg (174 lb 9.6 oz), SpO2 99 %. Physical Exam On examination patient has a palpable dorsalis pedis pulse. She is cellulitis involving the lesser toes excluding the great toe. She has a 2 cm ulcer beneath the second and third metatarsal head of the left foot and a necrotic ulcer dorsally between the second and third toes. After debridement the necrotic ulcer extended all the way down to the plantar ulcer there was a deep abscess. Patient does have a through and through ulcer involving tendon and bone of the second and third metatarsals. Dorsal ulcer is also 2 cm in diameter. Assessment/Plan: Assessment: Abscess ulcer osteomyelitis left foot with cellulitis of the forefoot with osteomyelitis of the second and third metatarsals.  Plan: With patient's extensive cellulitis abscess and osteomyelitis I do not feel that a second and third ray amputation would be sufficient  to heal or resolve the infection. Recommend proceeding with a transmetatarsal amputation. Patient and family state they are in agreement and wished to proceed at this time. I will request transfer of the [facility] to [facility] currently have a surgeries [facility] today and tomorrow and this will facilitate the surgery.  Edras Wilford V 03/15/2014, 11:44 AM

## 2014-03-15 NOTE — Anesthesia Procedure Notes (Signed)
Procedure Name: LMA Insertion Date/Time: 03/15/2014 4:53 PM Performed by: Edmonia Caprio Pre-anesthesia Checklist: Patient identified, Suction available, Patient being monitored, Emergency Drugs available and Timeout performed Patient Re-evaluated:Patient Re-evaluated prior to inductionOxygen Delivery Method: Circle system utilized Preoxygenation: Pre-oxygenation with 100% oxygen Intubation Type: IV induction Ventilation: Mask ventilation without difficulty LMA: LMA inserted LMA Size: 4.0 Tube secured with: Tape Dental Injury: Teeth and Oropharynx as per pre-operative assessment

## 2014-03-15 NOTE — Progress Notes (Signed)
Clinical Social Work  CSW received inappropriate referral to assist with medication assistance. Please consult CM for medication needs.  CSW is signing off but available if needed.  [place], [place] [phone]

## 2014-03-15 NOTE — Consult Note (Signed)
WOC wound consult note Reason for Consult: DFU left foot, with cellulitis, osteomyelitis and abscess Wound type:Neuropathic, infectious Noted is consultation this am by Dr. Aldean Baker, Orthopedics who has indicated need for an operative procedure (transmetatarsal head amputation) on this patient's foot.  Patient has been transferred from [facility] to [facility] for this procedure. There is no need for WOC Nurse wound consult at this time, given that patient is under the care of a physician specializing in wound care. I will defer to his expertise and not follow but remain available in the event WOC nursing services are needed postoperatively. Thanks, Ladona Mow, MSN, RN, GNP, [place], [facility] [phone])

## 2014-03-15 NOTE — Anesthesia Preprocedure Evaluation (Addendum)
Anesthesia Evaluation  Patient identified by MRN, date of birth, ID band Patient awake    Reviewed: Allergy & Precautions, H&P , NPO status   Airway Mallampati: II  TM Distance: >3 FB Neck ROM: Full    Dental   Pulmonary former smoker,          Cardiovascular hypertension, Pt. on medications     Neuro/Psych Depression    GI/Hepatic   Endo/Other  diabetes, Poorly Controlled, Type 2  Renal/GU      Musculoskeletal   Abdominal   Peds  Hematology   Anesthesia Other Findings   Reproductive/Obstetrics                            Anesthesia Physical Anesthesia Plan  ASA: III  Anesthesia Plan:    Post-op Pain Management:    Induction:   Airway Management Planned:   Additional Equipment:   Intra-op Plan:   Post-operative Plan:   Informed Consent:   Plan Discussed with:   Anesthesia Plan Comments:         Anesthesia Quick Evaluation

## 2014-03-15 NOTE — Op Note (Signed)
03/14/2014 - 03/15/2014  5:21 PM  PATIENT:  Anna Wagner    PRE-OPERATIVE DIAGNOSIS:  ABSCESS left foot with osteomyelitis of the second and third metatarsal heads, with ulcer that probes from the plantar aspect of the second and third metatarsals to the dorsum ulcer of the second and third metatarsals  POST-OPERATIVE DIAGNOSIS:  Same  PROCEDURE:  AMPUTATION TRANSMETATARSAL left  SURGEON:  Nadara Mustard, MD  PHYSICIAN ASSISTANT:None ANESTHESIA:   General  PREOPERATIVE INDICATIONS:  Anna Wagner is a  54 y.o. female with a diagnosis of ABSCESS FOOT who failed conservative measures and elected for surgical management.    The risks benefits and alternatives were discussed with the patient preoperatively including but not limited to the risks of infection, bleeding, nerve injury, cardiopulmonary complications, the need for revision surgery, among others, and the patient was willing to proceed.  OPERATIVE IMPLANTS: None  OPERATIVE FINDINGS: Good petechial bleeding with calcified vessels  OPERATIVE PROCEDURE: Patient was brought to the operating room and underwent a general anesthetic. After adequate levels of anesthesia were obtained patient's left lower extremity was prepped using DuraPrep draped into a sterile field. A timeout was called. A fishmouth incision was made just proximal to the plantar and dorsal ulcer. The metatarsals were resected through the mid shaft beveled plantarly through the mid substance of the metatarsals. There was calcification of the vessels and hemostasis was obtained with electrocautery. The wound was irrigated with normal saline. There is no sign of the abscess extended proximal to the dorsal or plantar ulcer. The incision was closed without tension the skin with 2-0 nylon. A sterile compressive dressing was applied patient was extubated taken to the PACU in stable condition.

## 2014-03-15 NOTE — Anesthesia Postprocedure Evaluation (Signed)
  Anesthesia Post-op Note  Patient: Anna Wagner  Procedure(s) Performed: Procedure(s): AMPUTATION TRANSMETATARSAL (Left)  Patient Location: PACU  Anesthesia Type:General  Level of Consciousness: awake, alert  and oriented  Airway and Oxygen Therapy: Patient Spontanous Breathing and Patient connected to nasal cannula oxygen  Post-op Pain: mild  Post-op Assessment: Post-op Vital signs reviewed, Patient's Cardiovascular Status Stable, Respiratory Function Stable, Patent Airway, No signs of Nausea or vomiting and Pain level controlled  Post-op Vital Signs: stable  Last Vitals:  Filed Vitals:   03/15/14 2001  BP: 132/69  Pulse: 88  Temp: 36.7 C  Resp: 14    Complications: No apparent anesthesia complications

## 2014-03-15 NOTE — Progress Notes (Signed)
Orthopedic Tech Progress Note Patient Details:  Anna Wagner Apr 12, 1960 [id number]  Ortho Devices Type of Ortho Device: Postop shoe/boot Ortho Device/Splint Location: LLE Ortho Device/Splint Interventions: Ordered, Application   [name] 03/15/2014, 8:05 PM

## 2014-03-16 DIAGNOSIS — I10 Essential (primary) hypertension: Secondary | ICD-10-CM

## 2014-03-16 DIAGNOSIS — L97509 Non-pressure chronic ulcer of other part of unspecified foot with unspecified severity: Secondary | ICD-10-CM

## 2014-03-16 LAB — GLUCOSE, CAPILLARY
GLUCOSE-CAPILLARY: 219 mg/dL — AB (ref 70–99)
Glucose-Capillary: 224 mg/dL — ABNORMAL HIGH (ref 70–99)
Glucose-Capillary: 148 mg/dL — ABNORMAL HIGH (ref 70–99)
Glucose-Capillary: 187 mg/dL — ABNORMAL HIGH (ref 70–99)

## 2014-03-16 MED ORDER — FLUCONAZOLE 150 MG PO TABS
150.0000 mg | ORAL_TABLET | Freq: Once | ORAL | Status: AC
  Administered 2014-03-16: 150 mg via ORAL
  Filled 2014-03-16: qty 1

## 2014-03-16 NOTE — Evaluation (Signed)
Physical Therapy Evaluation Patient Details Name: Anna Wagner MRN: [id number] DOB: 02-22-1961 Today's Date: 03/16/2014   History of Present Illness  Pt is a 54 y.o. female s/p Lt Transmetatarsal amputation.   Clinical Impression  Patient is s/p above surgery resulting in functional limitations due to the deficits listed below (see PT Problem List).  Patient will benefit from skilled PT to increase their independence and safety with mobility to allow discharge to the venue listed below.  Pt very anxious and fearful of falling. Verbally reviewed stair management using w/c with family and pt.    Follow Up Recommendations No PT follow up;Supervision/Assistance - 24 hour    Equipment Recommendations  None recommended by PT    Recommendations for Other Services       Precautions / Restrictions Precautions Precautions: Fall Restrictions Weight Bearing Restrictions: Yes LLE Weight Bearing: Touchdown weight bearing Other Position/Activity Restrictions: through heel only      Mobility  Bed Mobility               General bed mobility comments: pt sitting EOB upon entering room  Transfers Overall transfer level: Needs assistance Equipment used: Rolling walker (2 wheeled) Transfers: Sit to/from [employer] Sit to Stand: Min guard Stand pivot transfers: Min assist       General transfer comment: performed sit to stand x 3; cues for hand placement and sequencing; pt keeping Lt LE off ground; pt very anxious and fearful of falling  Ambulation/Gait             General Gait Details: pivotal steps only  Careers information officer    Modified Rankin (Stroke Patients Only)       Balance Overall balance assessment: Needs assistance Sitting-balance support: Feet supported;No upper extremity supported Sitting balance-Leahy Scale: Good     Standing balance support: During functional activity;Bilateral upper extremity  supported Standing balance-Leahy Scale: Poor Standing balance comment: RW required to balance                              Pertinent Vitals/Pain Pain Assessment: No/denies pain    Home Living Family/patient expects to be discharged to:: Private residence Living Arrangements: Spouse/significant other Available Help at Discharge: Family;Available 24 hours/day Type of Home: House Home Access: Other (comment);Stairs to enter (family is having ramp built)   Secretary/administrator of Steps: 2 Home Layout: One level Home Equipment: Walker - 2 wheels;Shower seat;Wheelchair - manual      Prior Function Level of Independence: Independent               Hand Dominance        Extremity/Trunk Assessment   Upper Extremity Assessment: Defer to OT evaluation           Lower Extremity Assessment: LLE deficits/detail   LLE Deficits / Details: transmetatarsal amputation   Cervical / Trunk Assessment: Normal  Communication   Communication: No difficulties  Cognition Arousal/Alertness: Awake/alert Behavior During Therapy: Anxious Overall Cognitive Status: Within Functional Limits for tasks assessed                      General Comments General comments (skin integrity, edema, etc.): discussed home setup recommendations and DME with pt and family for D/C     Exercises        Assessment/Plan    PT Assessment Patient needs continued PT  services  PT Diagnosis Difficulty walking;Generalized weakness;Acute pain   PT Problem List Decreased strength;Decreased activity tolerance;Decreased balance;Decreased mobility;Decreased knowledge of use of DME;Pain;Impaired sensation  PT Treatment Interventions DME instruction;Stair training;Functional mobility training;Therapeutic exercise;Therapeutic activities;Balance training;Neuromuscular re-education;Patient/family education;Wheelchair mobility training;Gait training   PT Goals (Current goals can be found in the  Care Plan section) Acute Rehab PT Goals Patient Stated Goal: to go home tomorrow PT Goal Formulation: With patient Time For Goal Achievement: 03/23/14 Potential to Achieve Goals: Good    Frequency Min 3X/week   Barriers to discharge        Co-evaluation               End of Session Equipment Utilized During Treatment: Gait belt;Other (comment) (post op shoe ) Activity Tolerance: Patient tolerated treatment well Patient left: in chair;with call bell/phone within reach;with family/visitor present Nurse Communication: Mobility status;Precautions;Weight bearing status         Time: 1610-9604 PT Time Calculation (min) (ACUTE ONLY): 18 min   Charges:   PT Evaluation $Initial PT Evaluation Tier I: 1 Procedure PT Treatments $Therapeutic Activity: 8-22 mins   PT G CodesDonell Sievert, [place]  [phone] 03/16/2014, 1:48 PM
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
## 2014-03-16 NOTE — Progress Notes (Signed)
Patient ID: Anna Wagner, female   DOB: [birth year], 54 y.o.   MRN: [id number] Postoperative day 1 transmetatarsal amputation. Patient's dressing is clean and dry. For discontinue IV antibiotics tomorrow. Plan for discharge to home tomorrow. I will follow-up in the office in one week. Touchdown weightbearing on the heel.

## 2014-03-17 DIAGNOSIS — G609 Hereditary and idiopathic neuropathy, unspecified: Secondary | ICD-10-CM

## 2014-03-17 DIAGNOSIS — E11319 Type 2 diabetes mellitus with unspecified diabetic retinopathy without macular edema: Secondary | ICD-10-CM

## 2014-03-17 LAB — URINE CULTURE
COLONY COUNT: NO GROWTH
Culture: NO GROWTH

## 2014-03-17 LAB — HIV ANTIBODY (ROUTINE TESTING W REFLEX): HIV 1&2 Ab, 4th Generation: NONREACTIVE

## 2014-03-17 LAB — HEMOGLOBIN A1C
Hgb A1c MFr Bld: 12.2 % — ABNORMAL HIGH (ref <5.7)
MEAN PLASMA GLUCOSE: 303 mg/dL — AB (ref <117)

## 2014-03-17 LAB — GLUCOSE, CAPILLARY
GLUCOSE-CAPILLARY: 198 mg/dL — AB (ref 70–99)
GLUCOSE-CAPILLARY: 257 mg/dL — AB (ref 70–99)

## 2014-03-17 LAB — C-REACTIVE PROTEIN: CRP: 14.8 mg/dL — AB (ref <0.60)

## 2014-03-17 MED ORDER — HYDROCODONE-ACETAMINOPHEN 5-325 MG PO TABS: 1.0000 | ORAL_TABLET | Freq: Four times a day (QID) | ORAL | Status: DC | PRN

## 2014-03-17 NOTE — Progress Notes (Signed)
Patient ID: Anna Wagner, female   DOB: 1960/09/10, 54 y.o.   MRN: [id number] Patient is comfortable this morning. Patient was given instructions to work on dorsiflexion of the ankle. Continue with protected weightbearing on the operative extremity. I'll follow-up in the office in one week.

## 2014-03-17 NOTE — Progress Notes (Signed)
Physical Therapy Treatment Patient Details Name: Anna Wagner MRN: [id number] DOB: 30-Jan-1961 Today's Date: 03/17/2014    History of Present Illness Pt is a 54 y.o. female s/p Lt Transmetatarsal amputation.     PT Comments    Pt able to take short, few shuffled hop to steps today with min guard. Recommend SPT at home and 24/7 (A) for safety. Educated pt and family on stair management technique with RW. Pt safe from PT standpoint to D/C home with family.   Follow Up Recommendations  No PT follow up;Supervision/Assistance - 24 hour     Equipment Recommendations  None recommended by PT    Recommendations for Other Services       Precautions / Restrictions Precautions Precautions: Fall Restrictions Weight Bearing Restrictions: Yes LLE Weight Bearing: Non weight bearing Other Position/Activity Restrictions: through heel only    Mobility  Bed Mobility Overal bed mobility: Modified Independent             General bed mobility comments: bed flattened handrails removed to simulate homem enviroment  Transfers Overall transfer level: Needs assistance Equipment used: Rolling walker (2 wheeled) Transfers: Sit to/from Stand Sit to Stand: Supervision         General transfer comment: cues for technique and safety  Ambulation/Gait Ambulation/Gait assistance: Min guard Ambulation Distance (Feet): 6 Feet Assistive device: Rolling walker (2 wheeled) Gait Pattern/deviations: Step-to pattern Gait velocity: decreased Gait velocity interpretation: Below normal speed for age/gender General Gait Details: pt with shuffled like steps in order to hop foward on Rt LE; demo good ability to manage NWB status on Lt LE; family present for education on guarding    Stairs Stairs: Yes   Stair Management: Wheelchair   General stair comments: pt and family given handout and educated on w/c management technique; they were able to verbalize understanding   Wheelchair Mobility     Modified Rankin (Stroke Patients Only)       Balance Overall balance assessment: Needs assistance Sitting-balance support: Feet supported;No upper extremity supported Sitting balance-Leahy Scale: Good     Standing balance support: During functional activity;Bilateral upper extremity supported Standing balance-Leahy Scale: Poor Standing balance comment: UE support at all times                     Cognition Arousal/Alertness: Awake/alert Behavior During Therapy: Anxious Overall Cognitive Status: Within Functional Limits for tasks assessed                      Exercises General Exercises - Lower Extremity Ankle Circles/Pumps: AROM;Both;10 reps;Seated Heel Slides: AROM;Strengthening;Both;5 reps;Seated Hip ABduction/ADduction: AROM;Left;10 reps;Seated Straight Leg Raises: AROM;Left;5 reps;Seated    General Comments General comments (skin integrity, edema, etc.): give HEP       Pertinent Vitals/Pain Pain Assessment: 0-10 Pain Score: 6  Pain Location: Lt foot Pain Descriptors / Indicators: Aching Pain Intervention(s): Monitored during session;Premedicated before session;Repositioned    Home Living                      Prior Function            PT Goals (current goals can now be found in the care plan section) Acute Rehab PT Goals Patient Stated Goal: to go home today PT Goal Formulation: With patient Time For Goal Achievement: 03/23/14 Potential to Achieve Goals: Good Progress towards PT goals: Progressing toward goals    Frequency  Min 3X/week    PT Plan Current plan remains  appropriate    Co-evaluation             End of Session Equipment Utilized During Treatment: Gait belt;Other (comment) (post op shoe) Activity Tolerance: Patient tolerated treatment well Patient left: in chair;with call bell/phone within reach;with family/visitor present     Time: 8657-8469 PT Time Calculation (min) (ACUTE ONLY): 17 min  Charges:   $Gait Training: 8-22 mins                    G CodesDonnamarie Poag [place] , [place]  [phone]  03/17/2014, 12:09 PM

## 2014-03-17 NOTE — Care Management Note (Signed)
Page 1 of 2   03/17/2014     1:15:09 PM CARE MANAGEMENT NOTE 03/17/2014  Patient:  Anna Wagner, Anna Wagner   Account Number:  [id number]  Date Initiated:  03/17/2014  Documentation initiated by:  [id number],[email or web address]  Subjective/Objective Assessment:   Lt Transmetatarsal amputation     Action/Plan:   Case Manager consult   Anticipated DC Date:  03/17/2014   Anticipated DC Plan:  [place]  CM consult      Choice offered to / List presented to:          [facility] arranged  [facility] PT      [place].   Status of service:  Completed, signed off Medicare Important Message given?   (If response is "NO", the following Medicare IM given date fields will be blank) Date Medicare IM given:   Medicare IM given by:   Date Additional Medicare IM given:   Additional Medicare IM given by:    Discharge Disposition:    Per UR Regulation:    If discussed at Long Length of Stay Meetings, dates discussed:    Comments:              1.3.2016 11.30am Case Manager consult    Met patient at bedside,role of CM explained.Pt reports understanding.Patient reports no active insurance though has   Attempted to secure insurance in past.Patient provided with a brochure for pcp services / Insurance information at   [employer] [facility] and is ware of need to follow up with clinic to establish a new patient appointment.   Patient reports medication costs are difficult for her at present.CM educated patient on [facility] [place] program.Guidelines   Of program explained patient aware she will receive help once a year.Provided with [place] letter and and list of   Participating pharmacies that provide this service.Patient has orders for home PT services .CM called [facility] spoke with   [place] she will verify if [facility] Will accept patient for PT HOME HEALTH at home.  [address]. [employer] RN-BSN- return call from [facility] reports they can accept referal and will  speak with patient  To arrange set up( CM reported this to patient who verifies she understands DC PLAN.Kenney Houseman Charge RN aware .
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
## 2014-03-17 NOTE — Progress Notes (Signed)
[facility] is providing the following services: Received order from case manager for patient to receive HHPT.  Patient currently does not have insurance, and she would need to be set up with [facility] charity program.  I called her three times to get some information from her to process the application and was not able to reach her.  The number that I have been attempting to reach her at is [phone].  Will inform the case manager that we can not move forward with her services until the application process is complete.  If patient discharges after hours, please call [phone].   Renard Hamper 03/17/2014, 4:34 PM

## 2014-03-18 ENCOUNTER — Encounter (HOSPITAL_COMMUNITY): Payer: Self-pay | Admitting: Orthopedic Surgery

## 2014-03-18 LAB — GLUCOSE, CAPILLARY: GLUCOSE-CAPILLARY: 227 mg/dL — AB (ref 70–99)

## 2014-03-20 LAB — CULTURE, BLOOD (ROUTINE X 2)
CULTURE: NO GROWTH
Culture: NO GROWTH

## 2014-05-20 ENCOUNTER — Encounter: Payer: Self-pay | Admitting: Endocrinology

## 2014-05-20 ENCOUNTER — Ambulatory Visit (INDEPENDENT_AMBULATORY_CARE_PROVIDER_SITE_OTHER): Payer: No Typology Code available for payment source | Admitting: Endocrinology

## 2014-05-20 VITALS — BP 139/92 | HR 101 | Temp 98.3°F | Resp 14 | Ht 64.0 in | Wt 178.4 lb

## 2014-05-20 DIAGNOSIS — L97509 Non-pressure chronic ulcer of other part of unspecified foot with unspecified severity: Secondary | ICD-10-CM

## 2014-05-20 DIAGNOSIS — Z89439 Acquired absence of unspecified foot: Secondary | ICD-10-CM | POA: Insufficient documentation

## 2014-05-20 DIAGNOSIS — E11621 Type 2 diabetes mellitus with foot ulcer: Secondary | ICD-10-CM

## 2014-05-20 DIAGNOSIS — Z89432 Acquired absence of left foot: Secondary | ICD-10-CM

## 2014-05-20 DIAGNOSIS — E1140 Type 2 diabetes mellitus with diabetic neuropathy, unspecified: Secondary | ICD-10-CM

## 2014-05-20 DIAGNOSIS — E876 Hypokalemia: Secondary | ICD-10-CM

## 2014-05-20 DIAGNOSIS — IMO0002 Reserved for concepts with insufficient information to code with codable children: Secondary | ICD-10-CM

## 2014-05-20 DIAGNOSIS — E1165 Type 2 diabetes mellitus with hyperglycemia: Secondary | ICD-10-CM

## 2014-05-20 DIAGNOSIS — E042 Nontoxic multinodular goiter: Secondary | ICD-10-CM

## 2014-05-20 LAB — TSH: TSH: 1.08 u[IU]/mL (ref 0.35–4.50)

## 2014-05-20 LAB — COMPREHENSIVE METABOLIC PANEL
ALBUMIN: 4.2 g/dL (ref 3.5–5.2)
ALK PHOS: 74 U/L (ref 39–117)
ALT: 20 U/L (ref 0–35)
AST: 14 U/L (ref 0–37)
BUN: 22 mg/dL (ref 6–23)
CO2: 26 meq/L (ref 19–32)
Calcium: 10.3 mg/dL (ref 8.4–10.5)
Chloride: 100 mEq/L (ref 96–112)
Creatinine, Ser: 0.7 mg/dL (ref 0.40–1.20)
GFR: 92.95 mL/min (ref >60.00)
Glucose, Bld: 245 mg/dL — ABNORMAL HIGH (ref 70–99)
Potassium: 4.1 mEq/L (ref 3.5–5.1)
SODIUM: 134 meq/L — AB (ref 135–145)
TOTAL PROTEIN: 7.3 g/dL (ref 6.0–8.3)
Total Bilirubin: 0.3 mg/dL (ref 0.2–1.2)

## 2014-05-20 LAB — T4, FREE: Free T4: 0.82 ng/dL (ref 0.60–1.60)

## 2014-05-20 MED ORDER — VICTOZA 18 MG/3ML ~~LOC~~ SOPN: 1.2000 mg | PEN_INJECTOR | Freq: Every day | SUBCUTANEOUS | Status: DC

## 2014-05-20 MED ORDER — METOPROLOL TARTRATE 25 MG PO TABS: 25.0000 mg | ORAL_TABLET | Freq: Two times a day (BID) | ORAL | Status: DC

## 2014-05-20 NOTE — Patient Instructions (Addendum)
Insulin 25 before bfst and 20 before supper  Start VICTOZA injection as shown once daily at the same time of the day.  Dial the dose to 0.6 mg on the pen for the first week.  You may inject in the stomach, thigh or arm. You may experience nausea in the first few days which usually goes away.  You will feel fullness of the stomach with starting the medication and should try to keep the portions at meals small. After 1 week increase the dose to 1.2mg  daily if no nausea present.   If any questions or concerns are present call the office or the [facility] helpline at [phone]. Visit [email or web address] for more useful information  Metformin 1 am and 2 at supper  Keep sugar diary

## 2014-05-20 NOTE — Progress Notes (Addendum)
Patient ID: Anna MoccasinMarsha R Wagner, female   DOB: 10-May-1960, 54 y.o.   MRN: [id number]           Reason for Appointment: Consultation for Type 2 Diabetes  Referring physician: Toni ArthursFuller  History of Present Illness:          Diagnosis: Type 2 diabetes mellitus, date of diagnosis: 1998       Past history:  She had initially presented to her physician with numbness in her toes when she found that she had diabetes. She had been treated with metformin alone for several years, level of control unknown although in 2008 her A1c was 11.6 About 2 years ago she was started on Levemir insulin by her PCP because of poor control and metformin continued She also has had difficulty losing weight over the last several years  Recent history:  Although she has been on Levemir insulin for the last 2 years her control has been poor with A1c in 7/15 being 11.3 She was changed to 70/30 insulin in the hospital in 1/16 but her blood sugars are continued to be high Her dose has not been adjusted. She thinks her blood sugars are over 200 on the time; did not bring any record today. She does not feel unusually thirsty or have frequent urination but may feel tired at times. He thinks he has had difficulty losing weight and may have gained some weight since she quit smoking last summer Unable to exercise because of continued foot problems on the left       Oral hypoglycemic drugs the patient is taking are: Metformin 500 mg twice a day Side effects from medications have been: None INSULIN regimen is described as:  15 units bid ac, 15 min before eating Compliance with the medical regimen: Fair Hypoglycemia:   none  Glucose monitoring:  done one time a day         Glucometer:[employer] brand Blood Glucose readings by recall: 200-250 at all different times  Self-care: The diet that the patient has been following is: tries to limit sweets .     Meals: 3 meals per day.           Exercise: none         Dietician visit, most  recent: Never .               Weight history: up since 7/15  Wt Readings from Last 3 Encounters:  05/20/14 178 lb 6.4 oz (80.922 kg)  03/14/14 174 lb 9.6 oz (79.198 kg)  11/12/13 176 lb (79.833 kg)    Glycemic control:   Lab Results  Component Value Date   HGBA1C 12.2* 03/15/2014   HGBA1C 11.3* 09/19/2013   HGBA1C 11.6 08/05/2006   Lab Results  Component Value Date   MICROALBUR 7.13* 08/19/2006   CREATININE 0.67 03/15/2014         Medication List       This list is accurate as of: 05/20/14 11:37 AM.  Always use your most recent med list.               amitriptyline 100 MG tablet  Commonly known as:  ELAVIL  Take 100 mg by mouth at bedtime.     aspirin EC 81 MG tablet  Take 81 mg by mouth at bedtime.     gabapentin 600 MG tablet  Commonly known as:  NEURONTIN  Take 600 mg by mouth 3 (three) times daily.     hydrochlorothiazide 25 MG tablet  Commonly known as:  HYDRODIURIL  Take 1 tablet (25 mg total) by mouth daily.     HYDROcodone-acetaminophen 5-325 MG per tablet  Commonly known as:  NORCO  Take 1-2 tablets by mouth every 6 (six) hours as needed for moderate pain.     insulin detemir 100 UNIT/ML injection  Commonly known as:  LEVEMIR  Inject 15 Units into the skin 2 (two) times daily.     insulin NPH-regular Human (70-30) 100 UNIT/ML injection  Commonly known as:  NOVOLIN 70/30  Inject 10 Units into the skin 2 (two) times daily with a meal.     lisinopril 10 MG tablet  Commonly known as:  PRINIVIL,ZESTRIL  Take 10 mg by mouth daily.     metFORMIN 500 MG tablet  Commonly known as:  GLUCOPHAGE  Take 500 mg by mouth 2 (two) times daily with a meal.     metoprolol tartrate 25 MG tablet  Commonly known as:  LOPRESSOR  Take 1 tablet (25 mg total) by mouth 2 (two) times daily.     neomycin-bacitracin-polymyxin Oint  Commonly known as:  NEOSPORIN  Apply 1 application topically 2 (two) times daily as needed for irritation or wound care.      sertraline 100 MG tablet  Commonly known as:  ZOLOFT  Take 100 mg by mouth daily.        Allergies: No Known Allergies  Past Medical History  Diagnosis Date  . Diabetes mellitus without complication   . Hypertension   . Bell's palsy 2005  . Neuromuscular disorder     NEUROPATHY    Past Surgical History  Procedure Laterality Date  . Cholecystectomy    . Cesarean section    . Amputation Left 03/15/2014    Procedure: AMPUTATION TRANSMETATARSAL;  Surgeon: Nadara Mustard, MD;  Location: [facility] OR;  Service: Orthopedics;  Laterality: Left;    Family History  Problem Relation Age of Onset  . Heart attack Mother     Social History:  reports that she quit smoking about 7 months ago. Her smoking use included Cigarettes. She has a 38 pack-year smoking history. She has never used smokeless tobacco. She reports that she does not drink alcohol or use illicit drugs.    Review of Systems       Vision is normal.  Most recent eye exam was with [facility] 5-6 ago, probably had treatment for retinopathy       Lipids: Results unknown, has not been on any lipid-lowering drugs      No results found for: CHOL, HDL, LDLCALC, LDLDIRECT, TRIG, CHOLHDL                Skin: No rash or infections     Thyroid:  No  unusual fatigue recently.  Has had gradual weight change.     The blood pressure has been treated with HCTZ and lisinopril.  Previously also on metoprolol possibly for palpitations and has not taken any since 6/15     No swelling of feet.     No shortness of breath or chest tightness  on exertion.     Bowel habits: Normal.      No joint  pains.           She has a long-standing history of history of Numbness in her feet, since her diagnosis was made.  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   HEENT:         Eye exam shows normal external appearance. Fundus exam shows no retinopathy. Oral exam shows normal mucosa .  NECK:         General:  Neck exam shows no lymphadenopathy. Carotids are normal to palpation and no bruit heard.  Thyroid is just palpable on swallowing on the right side medially, slightly firm.  Left side shows a 2-2.5 cm nodule in the medial lobe on swallowing.  No other nodules clearly palpable LUNGS:         Chest is symmetrical. Lungs are clear to auscultation.Marland Kitchen   HEART:         Heart sounds:  S1 and S2 are normal. No murmurs or clicks heard., no S3 or S4.   ABDOMEN:   There is no distention present. Liver and spleen are not palpable. No other mass or tenderness present.  EXTREMITIES:     There is no edema. No skin lesions present.Marland Kitchen  NEUROLOGICAL:   Vibration sense is nearly absent in toes. Ankle jerks are absent bilaterally.          Diabetic foot exam: Left transmetatarsal amputation present. Distally there is mild swelling and erythema but no exudate. Monofilament sensation is absent in the feet. 2+ posterior tibialis but absent dorsalis pedis pulses  MUSCULOSKELETAL:       There is no enlargement or deformity of the joints. Spine is normal to inspection.Marland Kitchen   SKIN:       No rash or lesions of concern.        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 Apparently has recently been on metoprolol and also appears to have sinus tachycardia  Unknown lipid status.  History of depression, on long-term medications including high-dose amitriptyline  THYROID NODULE: She has a palpable left-sided nodule and possibly small goiter on the right side also.  Most likely has a multinodular goiter but will need to establish baseline thyroid function especially with her fast pulse rate today  PLAN:  for diabetes:  For convenience will continue her premixed insulin for now and increase the dose by 10 units in the morning and 5 units in the evening.  She is a good candidate for adding a GLP-1 drug especially with her difficulty with losing weight Discussed with the patient the nature  of GLP-1 drugs, the actions on various organ systems, how they benefit blood glucose control, as well as the benefit of weight loss and  increase satiety . Explained possible side effects especially nausea and vomiting initially; discussed safety information in package insert.  Described the injection technique and dosage titration of Victoza  starting with 0.6 mg once a day at the same time for the first week and then increasing to 1.2 mg if no symptoms of nausea.  Educational brochure on Victoza and co-pay card given  Increase metformin to at least 1500 mg a day  Start checking blood sugars at different times a day and keep a record for review on the next visit.  Consider using a brand-new meter on the next time.  She needs to check and see which brand of  test strips is covered by her insurance, currently does not appear to have any coverage  Consultation with the dietitian  If blood sugars are not well controlled especially fasting may consider changing to a basal bolus regimen  Start exercise program when she is able to  Scheduled follow-up eye exam which is overdue  Continue gabapentin for neuropathy  For other problems:  Check thyroid functions  Restart metoprolol 25 mg twice a day, may need to have her see PCP again if she continues to have tachycardia and normal thyroid functions  Recheck potassium level as it was low in the hospital and she was not supplemented, continues to be on HCTZ 25 mg  Consider thyroid ultrasound to evaluate probable multinodular goiter especially with larger left-sided nodule  Counseling time over 50% of today's 60 minute visit  Larrell Rapozo 05/20/2014, 11:37 AM   Note: This office note was prepared with Insurance underwriter. Any transcriptional errors that result from this process are unintentional.   Addendum: Labs unremarkable except glucose 245  Office Visit on 05/20/2014  Component Date Value Ref Range Status  . Sodium 05/20/2014 134* 135 - 145 mEq/L Final  . Potassium 05/20/2014 4.1  3.5 - 5.1 mEq/L Final  . Chloride 05/20/2014 100  96 - 112 mEq/L Final  . CO2 05/20/2014 26  19 - 32 mEq/L Final  . Glucose, Bld 05/20/2014 245* 70 - 99 mg/dL Final  . BUN 16/12/9602 22  6 - 23 mg/dL Final  . Creatinine, Ser 05/20/2014 0.70  0.40 - 1.20 mg/dL Final  . Total Bilirubin 05/20/2014 0.3  0.2 - 1.2 mg/dL Final  . Alkaline Phosphatase 05/20/2014 74  39 - 117 U/L Final  . AST 05/20/2014 14  0 - 37 U/L Final  . ALT 05/20/2014 20  0 - 35 U/L Final  . Total Protein 05/20/2014 7.3  6.0 - 8.3 g/dL Final  . Albumin 54/11/8117 4.2  3.5 - 5.2 g/dL Final  . Calcium 14/78/2956 10.3  8.4 - 10.5 mg/dL Final  . GFR 21/30/8657 92.95  >60.00 mL/min Final  . TSH 05/20/2014 1.08  0.35 - 4.50  uIU/mL Final  . Free T4 05/20/2014 0.82  0.60 - 1.60 ng/dL Final

## 2014-06-06 ENCOUNTER — Encounter: Payer: No Typology Code available for payment source | Attending: Endocrinology | Admitting: Dietician

## 2014-06-06 ENCOUNTER — Encounter: Payer: Self-pay | Admitting: Endocrinology

## 2014-06-06 ENCOUNTER — Ambulatory Visit (INDEPENDENT_AMBULATORY_CARE_PROVIDER_SITE_OTHER): Payer: No Typology Code available for payment source | Admitting: Endocrinology

## 2014-06-06 VITALS — BP 136/90 | HR 95 | Temp 97.9°F | Ht 64.0 in | Wt 175.0 lb

## 2014-06-06 VITALS — Ht 64.0 in | Wt 175.0 lb

## 2014-06-06 DIAGNOSIS — E11319 Type 2 diabetes mellitus with unspecified diabetic retinopathy without macular edema: Secondary | ICD-10-CM | POA: Insufficient documentation

## 2014-06-06 DIAGNOSIS — Z713 Dietary counseling and surveillance: Secondary | ICD-10-CM | POA: Diagnosis not present

## 2014-06-06 DIAGNOSIS — IMO0002 Reserved for concepts with insufficient information to code with codable children: Secondary | ICD-10-CM

## 2014-06-06 DIAGNOSIS — Z89432 Acquired absence of left foot: Secondary | ICD-10-CM

## 2014-06-06 DIAGNOSIS — E1165 Type 2 diabetes mellitus with hyperglycemia: Secondary | ICD-10-CM | POA: Diagnosis not present

## 2014-06-06 DIAGNOSIS — Z794 Long term (current) use of insulin: Secondary | ICD-10-CM | POA: Diagnosis not present

## 2014-06-06 DIAGNOSIS — I10 Essential (primary) hypertension: Secondary | ICD-10-CM

## 2014-06-06 NOTE — Progress Notes (Signed)
Patient ID: Anna Wagner, female   DOB: 1960/04/20, 54 y.o.   MRN: [id number]           Reason for Appointment: Follow-up for Type 2 Diabetes  Referring physician: Toni Arthurs  History of Present Illness:          Diagnosis: Type 2 diabetes mellitus, date of diagnosis: 1998       Past history:  She had initially presented to her physician with numbness in her toes when she found that she had diabetes. She had been treated with metformin alone for several years, level of control unknown although in 2008 her A1c was 11.6 About 2 years ago she was started on Levemir insulin by her PCP because of poor control and metformin continued She also has had difficulty losing weight over the last several years  Recent history:  She has been on  insulin for the last 2 years in addition to metformin but her control has been poor with A1c in 7/15 being 11.3 and in 1/16 being 12.2 On her initial consultation she was started advised to increase her insulin because of significant hyperglycemia throughout the day and also metformin was increased to 1500 mg She was advised to start Victoza and increasing doses but she did not start this because of high out-of-pocket expense Recently she was seen by her PCP who gave her a sample of Trulicity, unknown dose and she took this about 2 weeks ago but has not taken her second dose as yet.  This does not appear to be covered by her insurance either She has tried to take her insulin as directed before breakfast and supper She has also not checked her insurance coverage for her test strips and continues to use the [employer] brand  Her blood sugars have been improving especially a few days after starting her Trulicity but they are starting to go back up again She had only mild nausea and diarrhea with Trulicity Unable to exercise because of continued foot problems and history of partial amputation of the foot        Oral hypoglycemic drugs the patient is taking are: Metformin    1500 mg    Side effects from medications have been: None INSULIN regimen is described as:  Novolin  70/30 25 units at breakfast and 20 units at supper Compliance with  the medical regimen:  fair  Hypoglycemia: none, felt a little shaky when blood sugar was 85     Glucose monitoring:  done one time a day         Glucometer:[employer] brand Blood Glucose readings by Review of record :   PRE-MEAL Breakfast Lunch Dinner Bedtime Overall  Glucose range: 85-191  104-213    Mean/median:        Self-care: The diet that the patient has been following is: none     Meals: 3 meals per day.  Breakfast is skips at times, otherwise eggs, toast, with bacon/sausage, or raisin bran       Exercise: none         Dietician visit, most recent:  never               Weight history:higher since 7/15  Wt Readings from Last 3 Encounters:  06/06/14 175 lb (79.379 kg)  05/20/14 178 lb 6.4 oz (80.922 kg)  03/14/14 174 lb 9.6 oz (79.198 kg)    Glycemic control:   Lab Results  Component Value Date   HGBA1C 12.2* 03/15/2014   HGBA1C 11.3*  09/19/2013   HGBA1C 11.6 08/05/2006   Lab Results  Component Value Date   MICROALBUR 7.13* 08/19/2006   CREATININE 0.70 05/20/2014         Medication List       This list is accurate as of: 06/06/14  3:29 PM.  Always use your most recent med list.               amitriptyline 100 MG tablet  Commonly known as:  ELAVIL  Take 100 mg by mouth at bedtime.     aspirin EC 81 MG tablet  Take 81 mg by mouth at bedtime.     gabapentin 600 MG tablet  Commonly known as:  NEURONTIN  Take 600 mg by mouth 3 (three) times daily.     hydrochlorothiazide 25 MG tablet  Commonly known as:  HYDRODIURIL  Take 1 tablet (25 mg total) by mouth daily.     HYDROcodone-acetaminophen 5-325 MG per tablet  Commonly known as:  NORCO  Take 1-2 tablets by mouth every 6 (six) hours as needed for moderate pain.     insulin NPH-regular Human (70-30) 100 UNIT/ML injection  Commonly  known as:  NOVOLIN 70/30  Inject 10 Units into the skin 2 (two) times daily with a meal.     lisinopril 10 MG tablet  Commonly known as:  PRINIVIL,ZESTRIL  Take 10 mg by mouth daily.     metFORMIN 500 MG tablet  Commonly known as:  GLUCOPHAGE  Take 500 mg by mouth 2 (two) times daily with a meal.     metoprolol tartrate 25 MG tablet  Commonly known as:  LOPRESSOR  Take 1 tablet (25 mg total) by mouth 2 (two) times daily.     neomycin-bacitracin-polymyxin Oint  Commonly known as:  NEOSPORIN  Apply 1 application topically 2 (two) times daily as needed for irritation or wound care.     sertraline 100 MG tablet  Commonly known as:  ZOLOFT  Take 100 mg by mouth daily.     VICTOZA 18 MG/3ML Sopn  Generic drug:  Liraglutide  Inject 0.2 mLs (1.2 mg total) into the skin daily. Inject once daily at the same time        Allergies: No Known Allergies  Past Medical History  Diagnosis Date  . Diabetes mellitus without complication   . Hypertension   . Bell's palsy 2005  . Neuromuscular disorder     NEUROPATHY    Past Surgical History  Procedure Laterality Date  . Cholecystectomy    . Cesarean section    . Amputation Left 03/15/2014    Procedure: AMPUTATION TRANSMETATARSAL;  Surgeon: Nadara Mustard, MD;  Location: [facility] OR;  Service: Orthopedics;  Laterality: Left;    Family History  Problem Relation Age of Onset  . Heart attack Mother   . Diabetes Father   . Diabetes Sister     Social History:  reports that she quit smoking about 8 months ago. Her smoking use included Cigarettes. She has a 38 pack-year smoking history. She has never used smokeless tobacco. She reports that she does not drink alcohol or use illicit drugs.    Review of Systems :  Previous review of system was significant for the following and information was updated today.  Most recent eye exam was with [facility] 5-6 ago, probably had treatment for retinopathy       Lipids: Results unknown, has not been  on any lipid-lowering drugs      No results  found for: CHOL, HDL, LDLCALC, LDLDIRECT, TRIG, CHOLHDL                The blood pressure has been treated with HCTZ and lisinopril.  Previously also on metoprolol possibly for palpitations and  this has been resumed          She has a long-standing history of history of Numbness in her feet, since her diagnosis was made.  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  Physical Examination:  BP 136/90 mmHg  Pulse 95  Temp(Src) 97.9 F (36.6 C) (Oral)  Ht 5\' 4"  (1.626 m)  Wt 175 lb (79.379 kg)  BMI 30.02 kg/m2  SpO2 97%   MUSCULOSKELETAL:   left stump of the midfoot amputation appears healthy with no drainage, tenderness or redness    ASSESSMENT:  Diabetes type 2, uncontrolled with mild obesity previously poor control   Her blood sugars did improve with increasing her insulin, metformin and for a few days after starting Trulicity although she was monitoring only readings before meals Discussed with patient that she will benefit from using a GLP-1 drug in addition to insulin and metformin with better control and limitation of any weight gain Discussed timing of glucose monitoring and blood sugar targets She will be seeing the dietitian today for meal planning which needs to be improved also   HYPERTENSION: Does not appear to be well controlled  Neuropathy with midtarsal amputation: Her wound appears to be free of  infection  PLAN:    She will try to get Victoza but the co-pay card given otherwise take Trulicity once a week with samples from the PCP.  Needs to confirm the dose she is taking as 0.75 and will need to increase this to 1.5 subsequently.  Discussed dosage titration with Victoza if she starts this.  Also if Bydureon is covered may consider this also  She will look into the brand name that will be covered for her glucose testing to enable better analysis of her monitor with download  Start monitoring readings at least half the time after meals  Discussed possibly reducing her insulin doses based on blood sugar patterns when blood sugars improved with Trulicity or Victoza  She will continue high dose of metformin  Continue follow-up with PCP of hypertension  Continue follow-up with orthopedic surgeon for her amputation   Patient Instructions  Check cost of Victoza and Bydureon Trulicity 1x per week  Reduce pm dose by 5 units when am sugar <90 and AM DOSE when supper sugar is <90  Please check blood sugars at least half the time about 2 hours after any meal and 3 times per week on waking up.  Please bring blood sugar record to each visit. Recommended blood sugar levels about 2 hours after meal is 140-180 and on waking up 90-130  Check cost of accucheck, [employer]ayer and One Touch meters    Counseling time over 50% of today's 25 minute visit  Delray Reza 06/06/2014, 3:29  PM   Note: This office note was prepared with Insurance underwriter. Any transcriptional errors that result from this process are unintentional.

## 2014-06-06 NOTE — Patient Instructions (Signed)
Plan:  Aim for 3 Carb Choices per meal (45 grams) +/- 1 either way  Aim for 0-15 Carbs per snack if hungry  Include protein in moderation with your meals and snacks Consider reading food labels for Total Carbohydrate and Fat Grams of foods Consider armchair exercises and other exercises daily as able. Consider checking BG at alternate times per day as directed by MD  Consider taking medication as directed by MD  Call insurance company about preferred meter.  (?[employer]) and where to find it.

## 2014-06-06 NOTE — Progress Notes (Signed)
  Medical Nutrition Therapy:  Appt start time: 1615 end time:  1715.   Assessment:  Primary concerns today: Patient diagnosed with type 2 diabetes in 1998.  Patient wants to know what to eat to better control her diabetes.  Hx also includes neuropathic diabetic ulcer of foot. Patient quit smoking July 9th,  2015.   Weight today 175 lbs.  295 lbs 2013 and lost by walking to 165 lbs.  Recent gain after stopping smoking.    Patient checks her feet daily.  Checks blood sugar bid with numbers in the high 100's and low to mid 200's.    Results for Anna Wagner, Anna Wagner (MRN [id number]) as of 06/06/2014 16:27  Ref. Range 08/05/2006 13:52 09/19/2013 12:23 03/15/2014 04:35  Hemoglobin A1C Latest Range: <5.7 % 11.6 11.3 (H) 12.2 (H)   Preferred Learning Style:   No preference indicated   Learning Readiness:   Ready   MEDICATIONS: Includes Trulicity.  Patient states that this has improved blood sugar level.  Also includes NPH and metformin.   DIETARY INTAKE: 24-hr recall:  B ( AM): skips at times, eggs, toast, with bacon/sausage, or raisin bran   Snk ( AM):   L ( PM):Mom and patient eat out a lot.   salad or ramen noodles (broth only with crackers) or baked potato and grilled chicken salad, chicken fillet sandwich, rare french fries, occasional pizza, fried fish with baked potato, roll and salad Snk ( PM): fruit or pistachios or orange sherbet with blueberries D ( PM): salmon patties, whipped potatoes, pintos, corn, or [place] (tacos or burritos)   Snk ( PM): see above snacks Beverages: water, unsweetened tea, diet soda  Usual physical activity: limited due to cracked foot, partial amputation and wound.  Estimated energy needs: 1500 calories 170 g carbohydrates 94 g protein 50 g fat  Progress Towards Goal(s):  In progress.   Nutritional Diagnosis:  NB-1.1 Food and nutrition-related knowledge deficit As related to balance of carbohydrates, protein, and fat.  As evidenced by diet hx.     Intervention:  Nutrition counseling and diabetes education initiated. Discussed Carb Counting by food group as method of portion control, reading food labels, and benefits of increased activity. Also discussed basic physiology of Diabetes, target BG ranges pre and post meals, and A1c.   Plan:  Aim for 3 Carb Choices per meal (45 grams) +/- 1 either way  Aim for 0-15 Carbs per snack if hungry  Include protein in moderation with your meals and snacks Consider reading food labels for Total Carbohydrate and Fat Grams of foods Consider armchair exercises and other exercises daily as able. Consider checking BG at alternate times per day as directed by MD  Consider taking medication as directed by MD  Call insurance company about preferred meter.  (?[employer]) and where to find it.  Instructed patient to call us if any questions.  Teaching Method Utilized:  Visual Auditory Hands on  Handouts given during visit include:  My plate placemat  Meal plan card  Type 2 DM support group flyer (free to all)  Label reading.  Barriers to learning/adherence to lifestyle change: none  Demonstrated degree of understanding via:  Teach Back   Monitoring/Evaluation:  Dietary intake, exercise, label reading and body weight prn.

## 2014-06-06 NOTE — Patient Instructions (Addendum)
Check cost of Victoza and Bydureon Trulicity 1x per week  Reduce pm dose by 5 units when am sugar <90 and AM DOSE when supper sugar is <90  Please check blood sugars at least half the time about 2 hours after any meal and 3 times per week on waking up.  Please bring blood sugar record to each visit. Recommended blood sugar levels about 2 hours after meal is 140-180 and on waking up 90-130  Check cost of accucheck, Bayer and One Touch meters

## 2014-07-18 ENCOUNTER — Ambulatory Visit: Payer: No Typology Code available for payment source | Admitting: Endocrinology

## 2014-10-07 ENCOUNTER — Other Ambulatory Visit: Payer: Self-pay | Admitting: Endocrinology

## 2014-11-04 ENCOUNTER — Other Ambulatory Visit: Payer: Self-pay | Admitting: Endocrinology

## 2014-11-11 ENCOUNTER — Other Ambulatory Visit: Payer: Self-pay | Admitting: Endocrinology

## 2014-11-18 ENCOUNTER — Other Ambulatory Visit: Payer: Self-pay | Admitting: Endocrinology

## 2014-11-25 ENCOUNTER — Other Ambulatory Visit: Payer: Self-pay | Admitting: Endocrinology

## 2016-02-10 ENCOUNTER — Emergency Department (HOSPITAL_COMMUNITY): Payer: Self-pay

## 2016-02-10 ENCOUNTER — Inpatient Hospital Stay (HOSPITAL_COMMUNITY): Payer: Self-pay

## 2016-02-10 ENCOUNTER — Inpatient Hospital Stay (HOSPITAL_COMMUNITY)
Admission: EM | Admit: 2016-02-10 | Discharge: 2016-02-13 | DRG: 871 | Disposition: A | Discharge status: Home / Self Care | Payer: Self-pay | Attending: Internal Medicine | Admitting: Internal Medicine

## 2016-02-10 ENCOUNTER — Encounter (HOSPITAL_COMMUNITY): Payer: Self-pay

## 2016-02-10 DIAGNOSIS — R2689 Other abnormalities of gait and mobility: Secondary | ICD-10-CM

## 2016-02-10 DIAGNOSIS — I10 Essential (primary) hypertension: Secondary | ICD-10-CM

## 2016-02-10 DIAGNOSIS — Z833 Family history of diabetes mellitus: Secondary | ICD-10-CM

## 2016-02-10 DIAGNOSIS — Z79899 Other long term (current) drug therapy: Secondary | ICD-10-CM

## 2016-02-10 DIAGNOSIS — E872 Acidosis, unspecified: Secondary | ICD-10-CM

## 2016-02-10 DIAGNOSIS — D649 Anemia, unspecified: Secondary | ICD-10-CM | POA: Diagnosis present

## 2016-02-10 DIAGNOSIS — F17210 Nicotine dependence, cigarettes, uncomplicated: Secondary | ICD-10-CM | POA: Diagnosis present

## 2016-02-10 DIAGNOSIS — E11319 Type 2 diabetes mellitus with unspecified diabetic retinopathy without macular edema: Secondary | ICD-10-CM

## 2016-02-10 DIAGNOSIS — B9620 Unspecified Escherichia coli [E. coli] as the cause of diseases classified elsewhere: Secondary | ICD-10-CM | POA: Diagnosis present

## 2016-02-10 DIAGNOSIS — A419 Sepsis, unspecified organism: Principal | ICD-10-CM

## 2016-02-10 DIAGNOSIS — Z7982 Long term (current) use of aspirin: Secondary | ICD-10-CM

## 2016-02-10 DIAGNOSIS — N390 Urinary tract infection, site not specified: Secondary | ICD-10-CM

## 2016-02-10 DIAGNOSIS — E1110 Type 2 diabetes mellitus with ketoacidosis without coma: Secondary | ICD-10-CM | POA: Diagnosis present

## 2016-02-10 DIAGNOSIS — Z9049 Acquired absence of other specified parts of digestive tract: Secondary | ICD-10-CM

## 2016-02-10 DIAGNOSIS — F329 Major depressive disorder, single episode, unspecified: Secondary | ICD-10-CM | POA: Diagnosis present

## 2016-02-10 DIAGNOSIS — E861 Hypovolemia: Secondary | ICD-10-CM | POA: Diagnosis present

## 2016-02-10 DIAGNOSIS — Z6831 Body mass index (BMI) 31.0-31.9, adult: Secondary | ICD-10-CM

## 2016-02-10 DIAGNOSIS — E1310 Other specified diabetes mellitus with ketoacidosis without coma: Secondary | ICD-10-CM

## 2016-02-10 DIAGNOSIS — Z89422 Acquired absence of other left toe(s): Secondary | ICD-10-CM

## 2016-02-10 DIAGNOSIS — Z8249 Family history of ischemic heart disease and other diseases of the circulatory system: Secondary | ICD-10-CM

## 2016-02-10 DIAGNOSIS — E1142 Type 2 diabetes mellitus with diabetic polyneuropathy: Secondary | ICD-10-CM | POA: Diagnosis present

## 2016-02-10 DIAGNOSIS — E871 Hypo-osmolality and hyponatremia: Secondary | ICD-10-CM | POA: Diagnosis present

## 2016-02-10 DIAGNOSIS — Z66 Do not resuscitate: Secondary | ICD-10-CM | POA: Diagnosis present

## 2016-02-10 DIAGNOSIS — N12 Tubulo-interstitial nephritis, not specified as acute or chronic: Secondary | ICD-10-CM | POA: Diagnosis present

## 2016-02-10 DIAGNOSIS — Z794 Long term (current) use of insulin: Secondary | ICD-10-CM

## 2016-02-10 DIAGNOSIS — K529 Noninfective gastroenteritis and colitis, unspecified: Secondary | ICD-10-CM | POA: Diagnosis present

## 2016-02-10 HISTORY — DX: Sepsis, unspecified organism: A41.9

## 2016-02-10 LAB — COMPREHENSIVE METABOLIC PANEL
ALT: 16 U/L (ref 14–54)
ANION GAP: 16 — AB (ref 5–15)
AST: 27 U/L (ref 15–41)
Albumin: 3.2 g/dL — ABNORMAL LOW (ref 3.5–5.0)
Alkaline Phosphatase: 77 U/L (ref 38–126)
BUN: 30 mg/dL — ABNORMAL HIGH (ref 6–20)
CHLORIDE: 94 mmol/L — AB (ref 101–111)
CO2: 19 mmol/L — AB (ref 22–32)
CREATININE: 1.18 mg/dL — AB (ref 0.44–1.00)
Calcium: 9.2 mg/dL (ref 8.9–10.3)
GFR calc non Af Amer: 51 mL/min — ABNORMAL LOW (ref >60)
GFR, EST AFRICAN AMERICAN: 59 mL/min — AB (ref >60)
Glucose, Bld: 390 mg/dL — ABNORMAL HIGH (ref 65–99)
POTASSIUM: 3.8 mmol/L (ref 3.5–5.1)
SODIUM: 129 mmol/L — AB (ref 135–145)
Total Bilirubin: 0.3 mg/dL (ref 0.3–1.2)
Total Protein: 6.4 g/dL — ABNORMAL LOW (ref 6.5–8.1)

## 2016-02-10 LAB — CBC
HCT: 35.5 % — ABNORMAL LOW (ref 36.0–46.0)
HCT: 33.3 % — ABNORMAL LOW (ref 36.0–46.0)
HEMOGLOBIN: 11.9 g/dL — AB (ref 12.0–15.0)
HEMOGLOBIN: 10.9 g/dL — AB (ref 12.0–15.0)
MCH: 29.2 pg (ref 26.0–34.0)
MCH: 28.7 pg (ref 26.0–34.0)
MCHC: 33.5 g/dL (ref 30.0–36.0)
MCHC: 32.7 g/dL (ref 30.0–36.0)
MCV: 87 fL (ref 78.0–100.0)
MCV: 87.6 fL (ref 78.0–100.0)
PLATELETS: 197 10*3/uL (ref 150–400)
Platelets: 222 10*3/uL (ref 150–400)
RBC: 4.08 MIL/uL (ref 3.87–5.11)
RBC: 3.8 MIL/uL — AB (ref 3.87–5.11)
RDW: 14.3 % (ref 11.5–15.5)
RDW: 14.4 % (ref 11.5–15.5)
WBC: 13.1 10*3/uL — ABNORMAL HIGH (ref 4.0–10.5)
WBC: 11.2 10*3/uL — AB (ref 4.0–10.5)

## 2016-02-10 LAB — URINALYSIS, ROUTINE W REFLEX MICROSCOPIC
BILIRUBIN URINE: NEGATIVE
Glucose, UA: >1000 mg/dL — AB
Ketones, ur: NEGATIVE mg/dL
NITRITE: NEGATIVE
PROTEIN: 100 mg/dL — AB
SPECIFIC GRAVITY, URINE: 1.021 (ref 1.005–1.030)
pH: 5 (ref 5.0–8.0)

## 2016-02-10 LAB — LIPID PANEL
CHOLESTEROL: 204 mg/dL — AB (ref 0–200)
HDL: 30 mg/dL — ABNORMAL LOW (ref >40)
LDL Cholesterol: UNDETERMINED mg/dL (ref 0–99)
TRIGLYCERIDES: 823 mg/dL — AB (ref <150)
Total CHOL/HDL Ratio: 6.8 RATIO
VLDL: UNDETERMINED mg/dL (ref 0–40)

## 2016-02-10 LAB — I-STAT CHEM 8, ED
BUN: 30 mg/dL — AB (ref 6–20)
CHLORIDE: 103 mmol/L (ref 101–111)
CREATININE: 0.8 mg/dL (ref 0.44–1.00)
Calcium, Ion: 0.94 mmol/L — ABNORMAL LOW (ref 1.15–1.40)
Glucose, Bld: 311 mg/dL — ABNORMAL HIGH (ref 65–99)
HEMATOCRIT: 32 % — AB (ref 36.0–46.0)
Hemoglobin: 10.9 g/dL — ABNORMAL LOW (ref 12.0–15.0)
POTASSIUM: 4 mmol/L (ref 3.5–5.1)
Sodium: 133 mmol/L — ABNORMAL LOW (ref 135–145)
TCO2: 20 mmol/L (ref 0–100)

## 2016-02-10 LAB — I-STAT CG4 LACTIC ACID, ED
Lactic Acid, Venous: 5.53 mmol/L (ref 0.5–1.9)
Lactic Acid, Venous: 3.93 mmol/L (ref 0.5–1.9)

## 2016-02-10 LAB — LACTIC ACID, PLASMA
LACTIC ACID, VENOUS: 2.7 mmol/L — AB (ref 0.5–1.9)
Lactic Acid, Venous: 3.4 mmol/L (ref 0.5–1.9)

## 2016-02-10 LAB — BLOOD GAS, ARTERIAL
Acid-base deficit: 4.4 mmol/L — ABNORMAL HIGH (ref 0.0–2.0)
Bicarbonate: 18.5 mmol/L — ABNORMAL LOW (ref 20.0–28.0)
Drawn by: 449561
FIO2: 21
O2 Saturation: 97.6 %
PATIENT TEMPERATURE: 98.6
PH ART: 7.484 — AB (ref 7.350–7.450)
PO2 ART: 96.7 mmHg (ref 83.0–108.0)
pCO2 arterial: 24.9 mmHg — ABNORMAL LOW (ref 32.0–48.0)

## 2016-02-10 LAB — BASIC METABOLIC PANEL
ANION GAP: 13 (ref 5–15)
ANION GAP: 9 (ref 5–15)
BUN: 26 mg/dL — ABNORMAL HIGH (ref 6–20)
BUN: 21 mg/dL — ABNORMAL HIGH (ref 6–20)
CALCIUM: 8.1 mg/dL — AB (ref 8.9–10.3)
CALCIUM: 7.8 mg/dL — AB (ref 8.9–10.3)
CO2: 18 mmol/L — AB (ref 22–32)
CO2: 20 mmol/L — ABNORMAL LOW (ref 22–32)
Chloride: 102 mmol/L (ref 101–111)
Chloride: 102 mmol/L (ref 101–111)
Creatinine, Ser: 0.9 mg/dL (ref 0.44–1.00)
Creatinine, Ser: 0.83 mg/dL (ref 0.44–1.00)
Glucose, Bld: 300 mg/dL — ABNORMAL HIGH (ref 65–99)
Glucose, Bld: 238 mg/dL — ABNORMAL HIGH (ref 65–99)
POTASSIUM: 3.7 mmol/L (ref 3.5–5.1)
POTASSIUM: 3.2 mmol/L — AB (ref 3.5–5.1)
Sodium: 133 mmol/L — ABNORMAL LOW (ref 135–145)
Sodium: 131 mmol/L — ABNORMAL LOW (ref 135–145)

## 2016-02-10 LAB — URINE MICROSCOPIC-ADD ON

## 2016-02-10 LAB — CBG MONITORING, ED
GLUCOSE-CAPILLARY: 291 mg/dL — AB (ref 65–99)
Glucose-Capillary: 446 mg/dL — ABNORMAL HIGH (ref 65–99)
Glucose-Capillary: 282 mg/dL — ABNORMAL HIGH (ref 65–99)

## 2016-02-10 LAB — GLUCOSE, CAPILLARY: Glucose-Capillary: 258 mg/dL — ABNORMAL HIGH (ref 65–99)

## 2016-02-10 MED ORDER — SODIUM CHLORIDE 0.9 % IV SOLN
INTRAVENOUS | Status: DC
  Administered 2016-02-10: 2.3 [IU]/h via INTRAVENOUS
  Filled 2016-02-10: qty 2.5

## 2016-02-10 MED ORDER — DEXTROSE 5 % IV SOLN
2.0000 g | Freq: Once | INTRAVENOUS | Status: AC
  Administered 2016-02-10: 2 g via INTRAVENOUS
  Filled 2016-02-10: qty 2

## 2016-02-10 MED ORDER — LACTATED RINGERS IV SOLN
INTRAVENOUS | Status: DC
  Administered 2016-02-10: 23:00:00 via INTRAVENOUS

## 2016-02-10 MED ORDER — GABAPENTIN 300 MG PO CAPS
600.0000 mg | ORAL_CAPSULE | Freq: Once | ORAL | Status: AC
  Administered 2016-02-10: 600 mg via ORAL
  Filled 2016-02-10: qty 2

## 2016-02-10 MED ORDER — FENTANYL CITRATE (PF) 100 MCG/2ML IJ SOLN
25.0000 ug | INTRAMUSCULAR | Status: DC | PRN
  Administered 2016-02-11: 25 ug via INTRAVENOUS
  Filled 2016-02-10: qty 2

## 2016-02-10 MED ORDER — ACETAMINOPHEN 325 MG PO TABS
650.0000 mg | ORAL_TABLET | Freq: Four times a day (QID) | ORAL | Status: DC | PRN
  Administered 2016-02-11 – 2016-02-12 (×3): 650 mg via ORAL
  Filled 2016-02-10 (×3): qty 2

## 2016-02-10 MED ORDER — ASPIRIN 300 MG RE SUPP: 300.0000 mg | RECTAL | Status: AC

## 2016-02-10 MED ORDER — SODIUM CHLORIDE 0.9 % IV BOLUS (SEPSIS)
1000.0000 mL | Freq: Once | INTRAVENOUS | Status: AC
  Administered 2016-02-10: 1000 mL via INTRAVENOUS

## 2016-02-10 MED ORDER — SODIUM CHLORIDE 0.9 % IV SOLN
1000.0000 mL | INTRAVENOUS | Status: DC
  Administered 2016-02-10: 1000 mL via INTRAVENOUS

## 2016-02-10 MED ORDER — FAMOTIDINE IN NACL 20-0.9 MG/50ML-% IV SOLN
20.0000 mg | Freq: Two times a day (BID) | INTRAVENOUS | Status: DC
Start: 2016-02-10 — End: 2016-02-10
  Filled 2016-02-10: qty 50

## 2016-02-10 MED ORDER — SERTRALINE HCL 100 MG PO TABS
100.0000 mg | ORAL_TABLET | Freq: Every day | ORAL | Status: DC
  Administered 2016-02-11 – 2016-02-13 (×3): 100 mg via ORAL
  Filled 2016-02-10 (×3): qty 1

## 2016-02-10 MED ORDER — SODIUM CHLORIDE 0.9 % IV SOLN
30.0000 meq | Freq: Once | INTRAVENOUS | Status: AC
  Administered 2016-02-11: 30 meq via INTRAVENOUS
  Filled 2016-02-10: qty 15

## 2016-02-10 MED ORDER — DEXTROSE 5 % IV SOLN
1.0000 g | INTRAVENOUS | Status: DC
  Administered 2016-02-11 – 2016-02-12 (×2): 1 g via INTRAVENOUS
  Filled 2016-02-10 (×3): qty 10

## 2016-02-10 MED ORDER — SERTRALINE HCL 20 MG/ML PO CONC: 100.0000 mg | Freq: Every day | ORAL | Status: DC

## 2016-02-10 MED ORDER — INSULIN GLARGINE 100 UNIT/ML ~~LOC~~ SOLN
20.0000 [IU] | Freq: Every day | SUBCUTANEOUS | Status: DC
  Administered 2016-02-11 (×2): 20 [IU] via SUBCUTANEOUS
  Filled 2016-02-10 (×3): qty 0.2

## 2016-02-10 MED ORDER — HEPARIN SODIUM (PORCINE) 5000 UNIT/ML IJ SOLN
5000.0000 [IU] | Freq: Three times a day (TID) | INTRAMUSCULAR | Status: DC
  Administered 2016-02-10 – 2016-02-12 (×7): 5000 [IU] via SUBCUTANEOUS
  Filled 2016-02-10 (×8): qty 1

## 2016-02-10 MED ORDER — ASPIRIN 81 MG PO CHEW
324.0000 mg | CHEWABLE_TABLET | ORAL | Status: AC
  Administered 2016-02-10: 324 mg via ORAL
  Filled 2016-02-10: qty 4

## 2016-02-10 MED ORDER — AMITRIPTYLINE HCL 50 MG PO TABS
100.0000 mg | ORAL_TABLET | Freq: Every day | ORAL | Status: DC
  Administered 2016-02-10 – 2016-02-12 (×3): 100 mg via ORAL
  Filled 2016-02-10 (×2): qty 2
  Filled 2016-02-10 (×2): qty 1

## 2016-02-10 MED ORDER — METOPROLOL TARTRATE 25 MG PO TABS
25.0000 mg | ORAL_TABLET | Freq: Two times a day (BID) | ORAL | Status: DC
  Administered 2016-02-10 – 2016-02-11 (×2): 25 mg via ORAL
  Filled 2016-02-10 (×2): qty 1

## 2016-02-10 MED ORDER — SODIUM CHLORIDE 0.9 % IV SOLN: 250.0000 mL | INTRAVENOUS | Status: DC | PRN

## 2016-02-10 MED ORDER — GABAPENTIN 300 MG PO CAPS
300.0000 mg | ORAL_CAPSULE | Freq: Three times a day (TID) | ORAL | Status: DC
  Administered 2016-02-11: 300 mg via ORAL
  Filled 2016-02-10 (×2): qty 1

## 2016-02-10 MED ORDER — DEXTROSE 10 % IV SOLN
INTRAVENOUS | Status: DC | PRN
Start: 2016-02-10 — End: 2016-02-12

## 2016-02-10 MED ORDER — FENTANYL CITRATE (PF) 100 MCG/2ML IJ SOLN: 25.0000 ug | INTRAMUSCULAR | Status: DC | PRN

## 2016-02-10 NOTE — H&P (Signed)
PULMONARY / CRITICAL CARE MEDICINE   Name: Anna Wagner MRN: [id number] DOB: [birth year]    ADMISSION DATE:  02/10/2016  REFERRING MD:  ED  CHIEF COMPLAINT:  Sepsis  HISTORY OF PRESENT ILLNESS:   Anna Wagner is a 55 year old female with past medical history listed below who presented to ED with fever and dysuria.  Patient and patient's mother contributed to history of present illness.  Patient was in her usual state of health until 4 days ago when she started to feel unwell, have dysuria, right-sided back pain and flank pain. She continued to feel unwell and developed nausea, chills and fever to 100.42F yesterday morning. She also noticed that her blood glucose was high despite taking her diabetic medications as prescribed. She tried to manage fever and pain with ibuprofen at home with minimal response and finally brought to ED by her mother.   Patient stated that her grandkids were with her over the Thanksgiving and but none of them were sick. However, she admitted that he has planned had sinus infection and cough that kept him at home from work yesterday.   She denies headache, vision changes, chest pain, shortness of breath, cough, cold symptoms, vomiting, diarrhea, focal weakness, numbness or tingling apart from her diabetic neuropathy. She quit smoking 2 years ago. She does not drink alcohol.   In ED, she was tachycardic to 110 and mildly hypertensive to 105/58 on arrival. CMP was remarkable for hyponatremia to 129 (corrected 134), hyperglycemia to 446, bicarbonate 19, creatinine 1.18 otherwise normal. CBC with mild leukocytosis to 13.1. She has lactic acidosis to 5.53. UA remarkable for UTI and glucosuria but no ketonuria. Chest x-ray without acute cardiopulmonary process. Code sepsis was initiated and she received 3L of normal saline bolus. Blood and urine culture were sent. She received 1 g ceftriaxone. CCM most called to admit patient for sepsis due to elevated lactic acid.   PAST  MEDICAL HISTORY :  She  has a past medical history of Bell's palsy (2005); Diabetes mellitus without complication (HCC); Hypertension; and Neuromuscular disorder (HCC).  PAST SURGICAL HISTORY: She  has a past surgical history that includes Cholecystectomy; Cesarean section; and Amputation (Left, 03/15/2014).  No Known Allergies  No current facility-administered medications on file prior to encounter.    Current Outpatient Prescriptions on File Prior to Encounter  Medication Sig  . amitriptyline (ELAVIL) 100 MG tablet Take 100 mg by mouth at bedtime.  Marland Kitchen aspirin EC 81 MG tablet Take 81 mg by mouth at bedtime.   . gabapentin (NEURONTIN) 600 MG tablet Take 600 mg by mouth 3 (three) times daily.  . hydrochlorothiazide (HYDRODIURIL) 25 MG tablet Take 1 tablet (25 mg total) by mouth daily.  Marland Kitchen lisinopril (PRINIVIL,ZESTRIL) 10 MG tablet Take 10 mg by mouth daily.  . metFORMIN (GLUCOPHAGE) 500 MG tablet Take 500 mg by mouth 2 (two) times daily with a meal.   . metoprolol tartrate (LOPRESSOR) 25 MG tablet TAKE ONE TABLET BY MOUTH TWICE DAILY (Patient taking differently: Take 25 mg by mouth two times a day)  . sertraline (ZOLOFT) 100 MG tablet Take 100 mg by mouth daily.  Marland Kitchen HYDROcodone-acetaminophen (NORCO) 5-325 MG per tablet Take 1-2 tablets by mouth every 6 (six) hours as needed for moderate pain. (Patient not taking: Reported on 02/10/2016)  . insulin NPH-regular Human (NOVOLIN 70/30) (70-30) 100 UNIT/ML injection Inject 10 Units into the skin 2 (two) times daily with a meal. (Patient not taking: Reported on 02/10/2016)  FAMILY HISTORY:  Her indicated that her mother is deceased. She indicated that the status of her father is unknown. She indicated that the status of her sister is unknown.    SOCIAL HISTORY: She  reports that she quit smoking about 2 years ago. Her smoking use included Cigarettes. She has a 38.00 pack-year smoking history. She has never used smokeless tobacco. She reports that  she does not drink alcohol or use drugs.  REVIEW OF SYSTEMS:   Per history of present illness  SUBJECTIVE:  Per history of present illness  VITAL SIGNS: BP 136/76   Pulse 100   Temp 98.8 F (37.1 C) (Oral)   Resp 18   Ht 5\' 4"  (1.626 m)   Wt 185 lb (83.9 kg)   SpO2 99%   BMI 31.76 kg/m   HEMODYNAMICS:  Not on pressors  VENTILATOR SETTINGS:  On room air  INTAKE / OUTPUT: No intake/output data recorded.  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  Sepsis Markers  Recent Labs Lab 02/10/16 1605  LATICACIDVEN 5.53*    ABG No results for input(s): PHART, PCO2ART, PO2ART in the last 168 hours.  Liver Enzymes  Recent Labs Lab 02/10/16 1539  AST 27  ALT 16  ALKPHOS 77  BILITOT 0.3  ALBUMIN 3.2*    Cardiac Enzymes No results for input(s): TROPONINI, PROBNP in the last 168 hours.  Glucose  Recent Labs Lab 02/10/16 1552  GLUCAP 446*    Imaging Dg  Chest Port 1 View  Result Date: 02/10/2016 CLINICAL DATA:  Fever, nausea, and body aches today, hypertension, diabetes mellitus EXAM: PORTABLE CHEST 1 VIEW COMPARISON:  Portable exam 1657 hours compared to 11/12/2013 FINDINGS: Normal heart size, mediastinal contours, and pulmonary vascularity. Lungs clear. No pleural effusion or pneumothorax. Bones unremarkable. IMPRESSION: No acute abnormalities. Electronically Signed   By: Ulyses SouthwardMark  Boles M.D.   On: 02/10/2016 17:04    STUDIES:  11/28 CXR: Normal  CULTURES: 11/28 blood culture>> 11/28 urine culture>>  ANTIBIOTICS: 11/28 ceftriaxone>>  SIGNIFICANT EVENTS: 11/28 admitted to ICU due to sepsis with pyelonephritis  LINES/TUBES: 11/28 PIV>>  DISCUSSION: Lewis MoccasinMarsha R Kulik is 55 year old female with history of diabetes who presents us with mild DKA and sepsis likely from pyelonephritis. Patient appears stable except for elevated lactic acidosis.   ASSESSMENT / PLAN:  PULMONARY A: No issues CXR clear P:   Continue to monitor  CARDIOVASCULAR A:  History of hypertension Sepsis Lactic acidosis P:  Hold home  medications IV fluid per phase-2 ICU glycemic control  RENAL A:   AKI  Lactic acidosis AGMA likely due to DKA UTI/Pyelonephritis P:   Trend LA IV fluid as above BMP every 4hrs Renal ultrasound  GASTROINTESTINAL A:   Chronic diarrhea due to cholecystectomy RLQ tenderness likely due to pyelo P:   Pepcid Nothing by mouth IV fluid as above If no improvement, will consider CT abdomen  HEMATOLOGIC A:   Mild leukocytosis Anemia-Hgb at baseline P:  Trend CBC Subcutaneous heparin  INFECTIOUS A:   Mild leukocytosis Sepsis, likely source UTI/pyelonephritis Lactic acidosis P:   Trend lactate Continue ceftriaxone Follow-up cultures If worsen clinically, broaden antibiotic Daily CBC  ENDOCRINE A:   DM 2 Mild DKA P:   ICU glycemic control phase II BMP every 4 hours NPO Hold home meds  NEUROLOGIC A:    Depression: Denies SI/HI Diabetic neuropathy P:   RASS goal: 0 to 1 Fentanyl as needed Resume home meds when able  FAMILY  - Updates: Mother at bedside and updated about the plan. Patient elects to be DNR.   - Inter-disciplinary family meet or Palliative Care meeting due by:  day 7   Candelaria Stagersaye Aashna Matson, MD.  02/10/16 7:32 PM PGY-2, [facility]   02/10/2016, 6:53 PM

## 2016-02-10 NOTE — ED Triage Notes (Signed)
Patient here with fever, body aches, dysuria and blood sugar greater than 400 x 1 day. Patient alert and oriented. Pale on arrival. States that the dysuria is better.

## 2016-02-10 NOTE — ED Notes (Signed)
Lactic 3.4 

## 2016-02-10 NOTE — ED Provider Notes (Addendum)
[facility]-EMERGENCY DEPT Provider Note   CSN: [id number] Arrival date & time: 02/10/16  1532     History   Chief Complaint Chief Complaint  Patient presents with  . Fever  . Weakness  . Generalized Body Aches    [name] is a 55 y.o. female.  HPI Patient presents with nausea body aches and fevers. Sugars up to 400 at home. Has had fever up to 101.7. Had severe dysuria yesterday. States she has pain in her lower back and mid back. No cough. No shortness of breath. States she just feels bad all over. She has felt lightheaded. No diarrhea.   Past Medical History:  Diagnosis Date  . Bell's palsy 2005  . Diabetes mellitus without complication (HCC)   . Hypertension   . Neuromuscular disorder [facility](HCC)    NEUROPATHY    Patient Active Problem List   Diagnosis Date Noted  . 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 Hypokalemia 03/15/2014  . Foot abscess, left 03/15/2014  . Wound infection 03/14/2014  . Diabetic foot ulcer (HCC) 03/14/2014  . DM type 2 (diabetes mellitus, type 2) (HCC) 03/14/2014  . Cellulitis of left foot 03/14/2014  . Bell's palsy   . VAGINITIS NOS 09/30/2006  . TOBACCO ABUSE 08/05/2006  . DEPRESSION 08/05/2006  . Hereditary and idiopathic peripheral neuropathy 08/05/2006  . HYPERTENSION 08/05/2006  . INSOMNIA 08/05/2006  . BELL'S PALSY, HX OF 08/05/2006  . HYSTERECTOMY, HX OF 08/05/2006  . CHOLECYSTECTOMY, HX OF 08/05/2006    Past Surgical History:  Procedure Laterality Date  . AMPUTATION Left 03/15/2014   Procedure: AMPUTATION TRANSMETATARSAL;  Surgeon: Nadara MustardMarcus Duda V, MD;  [facility];  Service: Orthopedics;  Laterality: Left;  . CESAREAN SECTION    . CHOLECYSTECTOMY      OB History    No data available       Home Medications    Prior to Admission medications   Medication Sig Start Date End Date Taking? Authorizing Provider  acetaminophen (TYLENOL) 500 MG tablet Take 1,000 mg by mouth every 6 (six) hours as needed  for fever.   Yes Historical Provider, MD  amitriptyline (ELAVIL) 100 MG tablet Take 100 mg by mouth at bedtime.   Yes Historical Provider, MD  aspirin EC 81 MG tablet Take 81 mg by mouth at bedtime.    Yes Historical Provider, MD  Cyanocobalamin (VITAMIN B-12 PO) Take 1 tablet by mouth 2 (two) times daily.   Yes Historical Provider, MD  gabapentin (NEURONTIN) 600 MG tablet Take 600 mg by mouth 3 (three) times daily.   Yes Historical Provider, MD  hydrochlorothiazide (HYDRODIURIL) 25 MG tablet Take 1 tablet (25 mg total) by mouth daily. 08/23/12  Yes Donita BrooksWarren T Pickard, MD  Insulin Glargine (TOUJEO SOLOSTAR) 300 UNIT/ML SOPN Inject 50 Units into the skin every evening.   Yes Historical Provider, MD  lisinopril (PRINIVIL,ZESTRIL) 10 MG tablet Take 10 mg by mouth daily.   Yes Historical Provider, MD  metFORMIN (GLUCOPHAGE) 500 MG tablet Take 500 mg by mouth 2 (two) times daily with a meal.    Yes Historical Provider, MD  metoprolol tartrate (LOPRESSOR) 25 MG tablet TAKE ONE TABLET BY MOUTH TWICE DAILY Patient taking differently: Take 25 mg by mouth two times a day 10/07/14  Yes Reather LittlerAjay Kumar, MD  sertraline (ZOLOFT) 100 MG tablet Take 100 mg by mouth daily.   Yes Historical Provider, MD  HYDROcodone-acetaminophen (NORCO) 5-325 MG per tablet Take 1-2 tablets by mouth every 6 (six)  hours as needed for moderate pain. Patient not taking: Reported on 02/10/2016 03/17/14   Maretta BeesShanker M Ghimire, MD  insulin NPH-regular Human (NOVOLIN 70/30) (70-30) 100 UNIT/ML injection Inject 10 Units into the skin 2 (two) times daily with a meal. Patient not taking: Reported on 02/10/2016 09/21/13   Carolan ClinesNora M Sadek, MD    Family History Family History  Problem Relation Age of Onset  . Heart attack Mother   . Diabetes Father   . Diabetes Sister     Social History Social History  Substance Use Topics  . Smoking status: Former Smoker    Packs/day: 1.00    Years: 38.00    Types: Cigarettes    Quit date: 09/19/2013  . Smokeless  tobacco: Never Used  . Alcohol use No     Allergies   Patient has no known allergies.   Review of Systems Review of Systems  Constitutional: Positive for appetite change, fatigue and fever.  HENT: Negative for dental problem.   Respiratory: Negative for chest tightness and shortness of breath.   Cardiovascular: Negative for chest pain.  Gastrointestinal: Positive for abdominal pain.  Genitourinary: Positive for dysuria and flank pain.  Musculoskeletal: Positive for back pain.  Neurological: Positive for light-headedness.  Psychiatric/Behavioral: Negative for agitation.     Physical Exam Updated Vital Signs BP 106/62 (BP Location: Right Arm)   Pulse 102   Temp 98.8 F (37.1 C) (Oral)   Resp 20   Ht 5\' 4"  (1.626 m)   Wt 185 lb (83.9 kg)   SpO2 96%   BMI 31.76 kg/m   Physical Exam  Constitutional: She appears well-developed.  HENT:  Head: Atraumatic.  Eyes: EOM are normal.  Neck: Neck supple.  Cardiovascular:  Mild tachycardia  Abdominal: Soft.  Right upper quadrant tenderness without rebound or guarding. No mass. Also suprapubic tenderness without mass.  Genitourinary:  Genitourinary Comments: Right-sided CVA tenderness.  Musculoskeletal: She exhibits no edema.  Neurological: She is alert.  Skin: Skin is warm. Capillary refill takes less than 2 seconds.     ED Treatments / Results  Labs (all labs ordered are listed, but only abnormal results are displayed) Labs Reviewed  CBC - Abnormal; Notable for the following:       Result Value   WBC 13.1 (*)    Hemoglobin 11.9 (*)    HCT 35.5 (*)    All other components within normal limits  URINALYSIS, ROUTINE W REFLEX MICROSCOPIC (NOT AT [facility]) - Abnormal; Notable for the following:    APPearance TURBID (*)    Glucose, UA >1000 (*)    Hgb urine dipstick SMALL (*)    Protein, ur 100 (*)    Leukocytes, UA LARGE (*)    All other components within normal limits  COMPREHENSIVE METABOLIC PANEL - Abnormal; Notable  for the following:    Sodium 129 (*)    Chloride 94 (*)    CO2 19 (*)    Glucose, Bld 390 (*)    BUN 30 (*)    Creatinine, Ser 1.18 (*)    Total Protein 6.4 (*)    Albumin 3.2 (*)    GFR calc non Af Amer 51 (*)    GFR calc Af Amer 59 (*)    Anion gap 16 (*)    All other components within normal limits  URINE MICROSCOPIC-ADD ON - Abnormal; Notable for the following:    Squamous Epithelial / LPF 6-30 (*)    Bacteria, UA MANY (*)    All  other components within normal limits  CBG MONITORING, ED - Abnormal; Notable for the following:    Glucose-Capillary 446 (*)    All other components within normal limits  I-STAT CG4 LACTIC ACID, ED - Abnormal; Notable for the following:    Lactic Acid, Venous 5.53 (*)    All other components within normal limits  CULTURE, BLOOD (ROUTINE X 2)  CULTURE, BLOOD (ROUTINE X 2)  URINE CULTURE  CBG MONITORING, ED    EKG  EKG Interpretation None       Radiology Dg Chest Port 1 View  Result Date: 02/10/2016 CLINICAL DATA:  Fever, nausea, and body aches today, hypertension, diabetes mellitus EXAM: PORTABLE CHEST 1 VIEW COMPARISON:  Portable exam 1657 hours compared to 11/12/2013 FINDINGS: Normal heart size, mediastinal contours, and pulmonary vascularity. Lungs clear. No pleural effusion or pneumothorax. Bones unremarkable. IMPRESSION: No acute abnormalities. Electronically Signed   By: Ulyses Southward M.D.   On: 02/10/2016 17:04    Procedures Procedures (including critical care time)  Medications Ordered in ED Medications  0.9 %  sodium chloride infusion (1,000 mLs Intravenous New Bag/Given 02/10/16 1658)  cefTRIAXone (ROCEPHIN) 1 g in dextrose 5 % 50 mL IVPB (not administered)  sodium chloride 0.9 % bolus 1,000 mL (1,000 mLs Intravenous New Bag/Given 02/10/16 1655)    And  sodium chloride 0.9 % bolus 1,000 mL (1,000 mLs Intravenous New Bag/Given 02/10/16 1700)    And  sodium chloride 0.9 % bolus 1,000 mL (1,000 mLs Intravenous New Bag/Given  02/10/16 1702)  cefTRIAXone (ROCEPHIN) 2 g in dextrose 5 % 50 mL IVPB (2 g Intravenous New Bag/Given 02/10/16 1704)     Initial Impression / Assessment and Plan / ED Course  I have reviewed the triage vital signs and the nursing notes.  Pertinent labs & imaging results that were available during my care of the patient were reviewed by me and considered in my medical decision making (see chart for details).  Clinical Course     Patient with fever and apparent pyelonephritis. Lactic acid of 5. Sugars been high and has slight anion gap but no ketones in the urine.  Mildly increased creatinine. Will give 30/kg bolus of IV fluids. Rocephin order for UTI. Will admit to internal medicine. Lactic acid elevation could be due to sepsis or 2 DKA. Code sepsis activated. Patient has had systolic pressures of 100 but not hypotension.  CRITICAL CARE Performed by: Billee Cashing Total critical care time: 30 minutes Critical care time was exclusive of separately billable procedures and treating other patients. Critical care was necessary to treat or prevent imminent or life-threatening deterioration. Critical care was time spent personally by me on the following activities: development of treatment plan with patient and/or surrogate as well as nursing, discussions with consultants, evaluation of patient's response to treatment, examination of patient, obtaining history from patient or surrogate, ordering and performing treatments and interventions, ordering and review of laboratory studies, ordering and review of radiographic studies, pulse oximetry and re-evaluation of patient's condition.  Discussed with Dr. Sunnie Nielsen from internal medicine and she states that since lactic acid is elevated of almost 6 she needs to be admitted to the ICU. Discussed with Dr. Nicholos Johns and from critical care, and they will come to see the patient.    Final Clinical Impressions(s) / ED Diagnoses   Final diagnoses:    Pyelonephritis  Diabetic ketoacidosis without coma associated with type 2 diabetes mellitus (HCC)    New Prescriptions New Prescriptions   No medications on  file     Benjiman Core, MD 02/10/16 1724    Benjiman Core, MD 02/10/16 [phone]

## 2016-02-10 NOTE — ED Notes (Signed)
Will credit istat chem 8 to patients acct.  Completed at 1914 was incorrect test.

## 2016-02-10 NOTE — ED Triage Notes (Signed)
Critical lactic acid from mini lab.  Charge nurse Italyhad Gross, RN made aware

## 2016-02-10 NOTE — ED Notes (Signed)
Got patient into a gown and on the monitor waiting for provider 

## 2016-02-11 DIAGNOSIS — A419 Sepsis, unspecified organism: Secondary | ICD-10-CM

## 2016-02-11 HISTORY — DX: Sepsis, unspecified organism: A41.9

## 2016-02-11 LAB — PHOSPHORUS: Phosphorus: 2.7 mg/dL (ref 2.5–4.6)

## 2016-02-11 LAB — GLUCOSE, CAPILLARY
GLUCOSE-CAPILLARY: 223 mg/dL — AB (ref 65–99)
GLUCOSE-CAPILLARY: 228 mg/dL — AB (ref 65–99)
Glucose-Capillary: 143 mg/dL — ABNORMAL HIGH (ref 65–99)
Glucose-Capillary: 182 mg/dL — ABNORMAL HIGH (ref 65–99)
Glucose-Capillary: 185 mg/dL — ABNORMAL HIGH (ref 65–99)
Glucose-Capillary: 200 mg/dL — ABNORMAL HIGH (ref 65–99)
Glucose-Capillary: 216 mg/dL — ABNORMAL HIGH (ref 65–99)
Glucose-Capillary: 219 mg/dL — ABNORMAL HIGH (ref 65–99)
Glucose-Capillary: 236 mg/dL — ABNORMAL HIGH (ref 65–99)
Glucose-Capillary: 237 mg/dL — ABNORMAL HIGH (ref 65–99)

## 2016-02-11 LAB — BASIC METABOLIC PANEL
ANION GAP: 9 (ref 5–15)
BUN: 21 mg/dL — ABNORMAL HIGH (ref 6–20)
CALCIUM: 7.8 mg/dL — AB (ref 8.9–10.3)
CO2: 20 mmol/L — AB (ref 22–32)
Chloride: 105 mmol/L (ref 101–111)
Creatinine, Ser: 0.78 mg/dL (ref 0.44–1.00)
GFR calc non Af Amer: >60 mL/min (ref >60)
Glucose, Bld: 158 mg/dL — ABNORMAL HIGH (ref 65–99)
Potassium: 3.5 mmol/L (ref 3.5–5.1)
SODIUM: 134 mmol/L — AB (ref 135–145)

## 2016-02-11 LAB — LACTIC ACID, PLASMA
LACTIC ACID, VENOUS: 0.8 mmol/L (ref 0.5–1.9)
LACTIC ACID, VENOUS: 1.3 mmol/L (ref 0.5–1.9)

## 2016-02-11 LAB — CBC WITH DIFFERENTIAL/PLATELET
BASOS ABS: 0 10*3/uL (ref 0.0–0.1)
BASOS PCT: 0 %
EOS ABS: 0.1 10*3/uL (ref 0.0–0.7)
Eosinophils Relative: 1 %
HCT: 30.5 % — ABNORMAL LOW (ref 36.0–46.0)
HEMOGLOBIN: 9.8 g/dL — AB (ref 12.0–15.0)
LYMPHS ABS: 1.5 10*3/uL (ref 0.7–4.0)
Lymphocytes Relative: 16 %
MCH: 28.4 pg (ref 26.0–34.0)
MCHC: 32.1 g/dL (ref 30.0–36.0)
MCV: 88.4 fL (ref 78.0–100.0)
Monocytes Absolute: 1 10*3/uL (ref 0.1–1.0)
Monocytes Relative: 10 %
NEUTROS PCT: 73 %
Neutro Abs: 7.2 10*3/uL (ref 1.7–7.7)
PLATELETS: 186 10*3/uL (ref 150–400)
RBC: 3.45 MIL/uL — AB (ref 3.87–5.11)
RDW: 14.9 % (ref 11.5–15.5)
WBC: 9.8 10*3/uL (ref 4.0–10.5)

## 2016-02-11 LAB — MAGNESIUM: MAGNESIUM: 1.3 mg/dL — AB (ref 1.7–2.4)

## 2016-02-11 LAB — MRSA PCR SCREENING: MRSA BY PCR: NEGATIVE

## 2016-02-11 MED ORDER — GABAPENTIN 300 MG PO CAPS
600.0000 mg | ORAL_CAPSULE | Freq: Three times a day (TID) | ORAL | Status: DC
  Administered 2016-02-11 – 2016-02-13 (×6): 600 mg via ORAL
  Filled 2016-02-11 (×7): qty 2

## 2016-02-11 MED ORDER — MAGNESIUM SULFATE 2 GM/50ML IV SOLN
2.0000 g | Freq: Once | INTRAVENOUS | Status: AC
  Administered 2016-02-11: 2 g via INTRAVENOUS
  Filled 2016-02-11: qty 50

## 2016-02-11 MED ORDER — POTASSIUM CHLORIDE CRYS ER 20 MEQ PO TBCR
20.0000 meq | EXTENDED_RELEASE_TABLET | Freq: Once | ORAL | Status: AC
  Administered 2016-02-11: 20 meq via ORAL
  Filled 2016-02-11: qty 1

## 2016-02-11 MED ORDER — SODIUM CHLORIDE 0.9 % IV BOLUS (SEPSIS)
1000.0000 mL | Freq: Once | INTRAVENOUS | Status: AC
  Administered 2016-02-11: 1000 mL via INTRAVENOUS

## 2016-02-11 MED ORDER — MAGNESIUM SULFATE 2 GM/50ML IV SOLN
2.0000 g | Freq: Once | INTRAVENOUS | Status: AC
Start: 2016-02-11 — End: 2016-02-11
  Administered 2016-02-11: 2 g via INTRAVENOUS
  Filled 2016-02-11: qty 50

## 2016-02-11 MED ORDER — INSULIN ASPART 100 UNIT/ML ~~LOC~~ SOLN
0.0000 [IU] | Freq: Every day | SUBCUTANEOUS | Status: DC
Start: 2016-02-11 — End: 2016-02-13

## 2016-02-11 MED ORDER — INSULIN ASPART 100 UNIT/ML ~~LOC~~ SOLN
0.0000 [IU] | SUBCUTANEOUS | Status: DC
  Administered 2016-02-11: 5 [IU] via SUBCUTANEOUS
  Administered 2016-02-11: 2 [IU] via SUBCUTANEOUS
  Administered 2016-02-11: 3 [IU] via SUBCUTANEOUS

## 2016-02-11 MED ORDER — INSULIN ASPART 100 UNIT/ML ~~LOC~~ SOLN
0.0000 [IU] | Freq: Three times a day (TID) | SUBCUTANEOUS | Status: DC
Start: 2016-02-11 — End: 2016-02-13
  Administered 2016-02-11 – 2016-02-12 (×3): 5 [IU] via SUBCUTANEOUS
  Administered 2016-02-12: 3 [IU] via SUBCUTANEOUS
  Administered 2016-02-13: 5 [IU] via SUBCUTANEOUS
  Administered 2016-02-13: 3 [IU] via SUBCUTANEOUS

## 2016-02-11 NOTE — Progress Notes (Signed)
PULMONARY / CRITICAL CARE MEDICINE   Name: Anna Wagner MRN: [id number] DOB: September 04, 1960    ADMISSION DATE:  02/10/2016  REFERRING MD:  ED  CHIEF COMPLAINT:  Sepsis  Brief:   Anna Wagner is a 55 year old female with past medical history listed below who presented to ED with fever and dysuria.   Patient and patient's mother contributed to history of present illness.  Patient was in her usual state of health until 4 days ago when she started to feel unwell and developed dysuria, right-sided back pain and flank pain. She had nausea, chills and fever to 100.59F yesterday morning. She also noticed her blood glucose was high despite taking her diabetic medications as prescribed. She tried to manage fever and pain with ibuprofen at home with minimal response and finally brought to ED by her mother.   Patient states that her grandkids were with her over the Thanksgiving holiday but none of them were sick. However, she admitted that her husband had sinus infection and cough that kept him at home from work yesterday.   She denies headache, vision changes, chest pain, shortness of breath, cough, cold symptoms, vomiting, diarrhea, focal weakness, numbness or tingling apart from her diabetic neuropathy. She quit smoking 2 years ago. She does not drink alcohol.   In ED, she was tachycardic to 110 and mildly hypotensive to 105/58 on arrival. CMP was remarkable for hyponatremia to 129 (corrected 134), hyperglycemia to 446, bicarbonate 19, creatinine 1.18 otherwise normal. CBC with mild leukocytosis to 13.1. She has lactic acidosis to 5.53. UA remarkable for UTI and glucosuria but no ketonuria. Chest x-ray without acute cardiopulmonary process. Code sepsis was initiated and she received 3L of normal saline bolus. Blood and urine culture were sent. She received 1 g ceftriaxone. CCM was called to admit patient for sepsis due to elevated lactic acid.   02/10/2016 [facility]: Suspected debris within the urinary  bladder. Otherwise negative renal ultrasound.   Chest X-ray AP&L: normal   CULTURES: 11/28 blood culture>> 11/28 urine culture>>  ANTIBIOTICS: 11/28 ceftriaxone>>  LINES/TUBES: 11/28 PIV>>  SIGNIFICANT EVENTS: 11/28 admitted to ICU due to mild DKA and sepsis with pyelonephritis  SUBJECTIVE:  No events overnight. Transitioned to SubQ insulin. Blood pressure low. Continues to endorse low abdominal pain. No more dysuria  VITAL SIGNS: BP 118/70 (BP Location: Right Arm)   Pulse 90   Temp 98.3 F (36.8 C) (Oral)   Resp (!) 24   Ht 5\' 4"  (1.626 m)   Wt 86.5 kg (190 lb 11.2 oz)   SpO2 94%   BMI 32.73 kg/m   HEMODYNAMICS:  Not on pressors  VENTILATOR SETTINGS:  On room air  INTAKE / OUTPUT: I/O last 3 completed shifts: In: 797.3 [I.V.:532.3; IV Piggyback:265] Out: 375 [Urine:375]  PHYSICAL EXAMINATION: General: Lying in bed,  Neuro: sleepy but arouses easily and responds to questions appropriately, cranial nerves grossly intact intact, moves extremities HEENT: Moist mucous membrane, sclera anicteric, PERRL Cardiovascular: RRR, S1 and S2 normal, no murmurs, no edema, 2+ DP pulses bilaterally Lungs: No increased work of breathing, clear to auscultation bilaterally Abdomen: Bowel sounds present and normal, soft, tender to palpation over right lower quadrant Musculoskeletal: no CVA tenderness, 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 Skin: Small dry clean looking wound on right medial malleolus about half a centimeter in diameter  LABS:  BMET  Recent Labs Lab 02/10/16 1844 02/10/16 1914 02/10/16 2216 02/11/16 0225  NA 133* 133* 131* 134*  K 3.7 4.0  3.2* 3.5  CL 102 103 102 105  CO2 18*  --  20* 20*  BUN 26* 30* 21* 21*  CREATININE 0.90 0.80 0.83 0.78  GLUCOSE 300* 311* 238* 158*    Electrolytes  Recent Labs Lab 02/10/16 1844 02/10/16 2216 02/11/16 0225  CALCIUM 8.1* 7.8* 7.8*  MG  --   --  1.3*  PHOS  --   --  2.7    CBC  Recent Labs Lab  02/10/16 1539 02/10/16 1844 02/10/16 1914 02/11/16 0600  WBC 13.1* 11.2*  --  9.8  HGB 11.9* 10.9* 10.9* 9.8*  HCT 35.5* 33.3* 32.0* 30.5*  PLT 222 197  --  186    Coag's No results for input(s): APTT, INR in the last 168 hours.  Sepsis Markers  Recent Labs Lab 02/10/16 1919 02/10/16 2216 02/11/16 0600  LATICACIDVEN 3.93* 2.7* 0.8   ABG  Recent Labs Lab 02/10/16 2144  PHART 7.484*  PCO2ART 24.9*  PO2ART 96.7   Liver Enzymes  Recent Labs Lab 02/10/16 1539  AST 27  ALT 16  ALKPHOS 77  BILITOT 0.3  ALBUMIN 3.2*   Cardiac Enzymes No results for input(s): TROPONINI, PROBNP in the last 168 hours.  Glucose  Recent Labs Lab 02/10/16 2306 02/11/16 0021 02/11/16 0039 02/11/16 0132 02/11/16 0250 02/11/16 0743  GLUCAP 236* 219* 228* 185* 143* 200*   I reviewed CXR, no acute disease noted.  DISCUSSION: Anna Wagner is 55 year old female with history of diabetes who presents us with mild DKA and sepsis likely from pyelonephritis. DKA resolved. Lactic acid trended down.   ASSESSMENT / PLAN:  PULMONARY A: No issues CXR clear P:   Continue to monitor  CARDIOVASCULAR A:  History of hypertension Hypotensive this morning P:  D/c Lopressor.  Hold home lisinopril & hydrochlorothiazide. KVO IVF Diet  RENAL A:   AKI: resolved Lactic acidosis: resolved AGMA likely due to DKA: resolved UTI/Pyelonephritis: renal US with debris in bladder P:   BMP daily KVO IVF Replace electrolytes as indicated  GASTROINTESTINAL A:   Chronic diarrhea due to cholecystectomy RLQ tenderness likely due to pyelo P:   Carb modified diet No GI prophylaxis needed  HEMATOLOGIC A:   Mild leukocytosis Anemia-Hgb at baseline P:  CBC as needed clinically Subcutaneous heparin  INFECTIOUS A:   Mild leukocytosis Sepsis, likely source UTI/pyelonephritis Lactic acidosis resolved P:   Continue ceftriaxone Follow-up cultures If worsen clinically, broaden  antibiotic CBC if needed clinically  ENDOCRINE A:   DM 2 Mild DKA: resolved P:   Transitioned to SubQ insulin  -Lantus 20 units  -SSI Follow A1c CBG every 4 hours Hold home metformin  NEUROLOGIC A:   Depression: Denies SI/HI Diabetic neuropathy P:   RASS goal: 0 to 1 Fentanyl as needed Continue home Zoloft, Elavil and Neurontin Not taking Norco at home  FAMILY  - Updates: Patient updated bedside.  - Inter-disciplinary family meet or Palliative Care meeting due by:  day 7  Disposition: transfer to med-surg  Discuss[facility] with [facility]30  Alyson ReedyWesam G. Ricardo Kayes, M.D. [facility]. Pager: [phone]. After hours pager: [phone].

## 2016-02-11 NOTE — Progress Notes (Signed)
PULMONARY / CRITICAL CARE MEDICINE   Name: Anna Wagner MRN: [id number] DOB: 1960/08/11    ADMISSION DATE:  02/10/2016  REFERRING MD:  ED  CHIEF COMPLAINT:  Sepsis  Brief:   Anna Wagner is a 55 year old female with past medical history listed below who presented to ED with fever and dysuria.   Patient and patient's mother contributed to history of present illness.  Patient was in her usual state of health until 4 days ago when she started to feel unwell and developed dysuria, right-sided back pain and flank pain. She had nausea, chills and fever to 100.13F yesterday morning. She also noticed her blood glucose was high despite taking her diabetic medications as prescribed. She tried to manage fever and pain with ibuprofen at home with minimal response and finally brought to ED by her mother.   Patient states that her grandkids were with her over the Thanksgiving holiday but none of them were sick. However, she admitted that her husband had sinus infection and cough that kept him at home from work yesterday.   She denies headache, vision changes, chest pain, shortness of breath, cough, cold symptoms, vomiting, diarrhea, focal weakness, numbness or tingling apart from her diabetic neuropathy. She quit smoking 2 years ago. She does not drink alcohol.   In ED, she was tachycardic to 110 and mildly hypotensive to 105/58 on arrival. CMP was remarkable for hyponatremia to 129 (corrected 134), hyperglycemia to 446, bicarbonate 19, creatinine 1.18 otherwise normal. CBC with mild leukocytosis to 13.1. She has lactic acidosis to 5.53. UA remarkable for UTI and glucosuria but no ketonuria. Chest x-ray without acute cardiopulmonary process. Code sepsis was initiated and she received 3L of normal saline bolus. Blood and urine culture were sent. She received 1 g ceftriaxone. CCM was called to admit patient for sepsis due to elevated lactic acid.   Imaging  02/10/2016 [facility]: Suspected debris within the  urinary bladder. Otherwise negative renal ultrasound.   Chest X-ray AP&L: normal   CULTURES: 11/28 blood culture>> 11/28 urine culture>>  ANTIBIOTICS: 11/28 ceftriaxone>>  LINES/TUBES: 11/28 PIV>>  SIGNIFICANT EVENTS: 11/28 admitted to ICU due to mild DKA and sepsis with pyelonephritis  SUBJECTIVE:  No events overnight. Transitioned to SubQ insulin. Blood pressure low. Continues to endorse low abdominal pain. No more dysuria  VITAL SIGNS: BP 99/64 (BP Location: Right Arm)   Pulse 86   Temp 98.1 F (36.7 C) (Oral)   Resp 20   Ht 5\' 4"  (1.626 m)   Wt 190 lb 11.2 oz (86.5 kg)   SpO2 96%   BMI 32.73 kg/m   HEMODYNAMICS:  Not on pressors  VENTILATOR SETTINGS:  On room air  INTAKE / OUTPUT: No intake/output data recorded.  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9*  HCT 35.5* 33.3* 32.0*  PLT 222 197  --     Coag's No results for input(s): APTT, INR in the last 168 hours.  Sepsis Markers  Recent Labs Lab 02/10/16 1850 02/10/16 1919 02/10/16 2216  LATICACIDVEN 3.4* 3.93* 2.7*    ABG  Recent Labs Lab 02/10/16 2144  PHART 7.484*  PCO2ART 24.9*  PO2ART 96.7    Liver Enzymes  Recent Labs Lab 02/10/16 1539  AST 27  ALT 16  ALKPHOS 77  BILITOT 0.3  ALBUMIN 3.2*    Cardiac Enzymes No results for input(s): TROPONINI, PROBNP in the last 168 hours.  Glucose  Recent Labs Lab 02/10/16 2147 02/10/16 2306 02/11/16 0021 02/11/16 0039 02/11/16 0132 02/11/16 0250  GLUCAP 258* 236* 219* 228* 185* 143*      DISCUSSION: Anna Wagner is 55 year old female with history of diabetes who presents us with mild DKA and sepsis likely from pyelonephritis. DKA resolved. Lactic acid trended down.   ASSESSMENT / PLAN:  PULMONARY A: No issues CXR clear P:   Continue to monitor  CARDIOVASCULAR A:  History of hypertension Hypotensive this morning P:  D/c Lopressor.  NS bolus 1L Hold home lisinopril & hydrochlorothiazide.  RENAL A:   AKI: resolved Lactic acidosis: resolved AGMA likely due to DKA: resolved UTI/Pyelonephritis: renal US with debris in bladder P:   Trend LA BMP daily  GASTROINTESTINAL A:   Chronic diarrhea due to cholecystectomy RLQ tenderness likely due to pyelo P:   Carb modified diet   HEMATOLOGIC A:   Mild leukocytosis Anemia-Hgb at baseline P:  CBC as needed clinically Subcutaneous heparin  INFECTIOUS A:   Mild leukocytosis Sepsis, likely source UTI/pyelonephritis Lactic acidosis resolved P:   Continue ceftriaxone Follow-up cultures If worsen clinically, broaden antibiotic CBC if needed clinically  ENDOCRINE A:   DM 2 Mild DKA: resolved P:   Transitioned to SubQ insulin  -Lantus 20 units  -SSI Follow A1c CBG every 4 hours Hold home  metformin  NEUROLOGIC A:   Depression: Denies SI/HI Diabetic neuropathy P:   RASS goal: 0 to 1 Fentanyl as needed Continue home Zoloft, Elavil and Neurontin Not taking Norco at home  FAMILY  - Updates: no family at bedside  - Inter-disciplinary family meet or Palliative Care meeting due by:  day 7  Disposition: transfer to med-surg once HDS Candelaria Stagersaye Gonfa, MD.  02/11/16 5:30 AM PGY-2  Alyson ReedyWesam G. Yacoub, M.D. [facility]. Pager: [phone]. After hours pager: [phone].

## 2016-02-11 NOTE — Progress Notes (Signed)
Inpatient Diabetes Program Recommendations  AACE/ADA: New Consensus Statement on Inpatient Glycemic Control (2015)  Target Ranges:  Prepandial:   less than 140 mg/dL      Peak postprandial:   less than 180 mg/dL (1-2 hours)      Critically ill patients:  140 - 180 mg/dL   Lab Results  Component Value Date   GLUCAP 223 (H) 02/11/2016   HGBA1C 12.2 (H) 03/15/2014    Review of Glycemic Control  Inpatient Diabetes Program Recommendations:  Spoke with patient and mom @ bedside. Patient had seen Dr. Lucianne MussKumar in the past and was on 70/30 insulin 25 am & 20 units pm ac meals. Patient currently does not have insurance,so has been using a copay card to purchase Toujeo for $10 per month. Patient's husband works but unable to afford [employer] insurance available. Due to needing meal coverage and no insurance, recommend starting patient on 70/30 insulin 15 units bid and adjust as needed. Will follow.  Thank you, Billy FischerJudy E. Calab Sachse, RN, MSN, CDE Inpatient Glycemic Control Team Team Pager [phone] (8am-5pm) 02/11/2016 2:47 PM

## 2016-02-12 ENCOUNTER — Encounter (HOSPITAL_COMMUNITY): Payer: Self-pay | Admitting: General Practice

## 2016-02-12 DIAGNOSIS — E119 Type 2 diabetes mellitus without complications: Secondary | ICD-10-CM

## 2016-02-12 DIAGNOSIS — E1149 Type 2 diabetes mellitus with other diabetic neurological complication: Secondary | ICD-10-CM

## 2016-02-12 DIAGNOSIS — Z794 Long term (current) use of insulin: Secondary | ICD-10-CM

## 2016-02-12 LAB — BASIC METABOLIC PANEL
Anion gap: 10 (ref 5–15)
BUN: 10 mg/dL (ref 6–20)
CHLORIDE: 100 mmol/L — AB (ref 101–111)
CO2: 22 mmol/L (ref 22–32)
CREATININE: 0.67 mg/dL (ref 0.44–1.00)
Calcium: 8.4 mg/dL — ABNORMAL LOW (ref 8.9–10.3)
GFR calc Af Amer: >60 mL/min (ref >60)
GFR calc non Af Amer: >60 mL/min (ref >60)
Glucose, Bld: 187 mg/dL — ABNORMAL HIGH (ref 65–99)
POTASSIUM: 4.1 mmol/L (ref 3.5–5.1)
Sodium: 132 mmol/L — ABNORMAL LOW (ref 135–145)

## 2016-02-12 LAB — GLUCOSE, CAPILLARY
GLUCOSE-CAPILLARY: 184 mg/dL — AB (ref 65–99)
GLUCOSE-CAPILLARY: 212 mg/dL — AB (ref 65–99)
GLUCOSE-CAPILLARY: 144 mg/dL — AB (ref 65–99)
Glucose-Capillary: 201 mg/dL — ABNORMAL HIGH (ref 65–99)

## 2016-02-12 LAB — CBC
HEMATOCRIT: 32.7 % — AB (ref 36.0–46.0)
Hemoglobin: 10.8 g/dL — ABNORMAL LOW (ref 12.0–15.0)
MCH: 28.8 pg (ref 26.0–34.0)
MCHC: 33 g/dL (ref 30.0–36.0)
MCV: 87.2 fL (ref 78.0–100.0)
PLATELETS: 207 10*3/uL (ref 150–400)
RBC: 3.75 MIL/uL — ABNORMAL LOW (ref 3.87–5.11)
RDW: 14.4 % (ref 11.5–15.5)
WBC: 7.9 10*3/uL (ref 4.0–10.5)

## 2016-02-12 LAB — HEMOGLOBIN A1C
HEMOGLOBIN A1C: 12 % — AB (ref 4.8–5.6)
MEAN PLASMA GLUCOSE: 298 mg/dL

## 2016-02-12 LAB — PHOSPHORUS: Phosphorus: 2.1 mg/dL — ABNORMAL LOW (ref 2.5–4.6)

## 2016-02-12 LAB — MAGNESIUM: Magnesium: 1.7 mg/dL (ref 1.7–2.4)

## 2016-02-12 MED ORDER — INSULIN ASPART PROT & ASPART (70-30 MIX) 100 UNIT/ML ~~LOC~~ SUSP
15.0000 [IU] | Freq: Two times a day (BID) | SUBCUTANEOUS | Status: DC
  Administered 2016-02-12 – 2016-02-13 (×3): 15 [IU] via SUBCUTANEOUS
  Filled 2016-02-12: qty 10

## 2016-02-12 NOTE — Progress Notes (Signed)
Inpatient Diabetes Program Recommendations  AACE/ADA: New Consensus Statement on Inpatient Glycemic Control (2015)  Target Ranges:  Prepandial:   less than 140 mg/dL      Peak postprandial:   less than 180 mg/dL (1-2 hours)      Critically ill patients:  140 - 180 mg/dL   Lab Results  Component Value Date   GLUCAP 184 (H) 02/12/2016   HGBA1C 12.0 (H) 02/10/2016    Review of Glycemic Control:  Results for [name], Ceana R (MRN [id number]) as of 02/12/2016 11:56  Ref. Range 02/11/2016 11:17 02/11/2016 15:34 02/11/2016 17:09 02/11/2016 21:36 02/12/2016 08:21  Glucose-Capillary Latest Ref Range: 65 - 99 mg/dL 409223 (H) 811237 (H) 914216 (H) 182 (H) 184 (H)   Inpatient Diabetes Program Recommendations:    Consider increasing 70/30 to 18 units bid.  Note at d/c please order Novolin-Reli-on 70/30 as this can be purchased for 24.88$ per vial.  Note A1C indicates poor glycemic control.  Likely patient was not taking insulin as ordered due to cost. Will follow-up with patient.  Thanks, Beryl MeagerJenny Rashiya Lofland, RN, BC-ADM Inpatient Diabetes Coordinator Pager [phone] (8a-5p)

## 2016-02-12 NOTE — Progress Notes (Signed)
[facility] PROGRESS NOTE  Anna MoccasinMarsha R Dawson NWG:[id number] DOB: 11/20/1960 DOA: 02/10/2016  PCP: Tomi BambergerFULLER,SUSAN, NP  Brief History/Interval Summary: 55 year old Caucasian female with a past medical history of diabetes, hypertension, peripheral neuropathy, presented to the emergency department with complaints of fever and dysuria. Patient was found to have a urinary tract infection. She was also noted to be in diabetic ketoacidosis. She was hospitalized to the intensive care unit for further management.  Reason for Visit: DKA and UTI  Consultants: None  Procedures: None So far  Antibiotics: Ceftriaxone  Subjective/Interval History: Patient complains of her neuropathic pain. Otherwise she is feeling better. She denies any nausea or vomiting. Denies any abdominal pain. Able to urinate without difficulty now.  ROS: Denies any chest pain or shortness of breath.  Objective:  Vital Signs  Vitals:   02/11/16 1614 02/11/16 2135 02/12/16 0509 02/12/16 0511  BP: 132/67 131/67  125/71  Pulse: 98 (!) 101  99  Resp: 18 17  16   Temp: 98.8 F (37.1 C) 100 F (37.8 C)  98.4 F (36.9 C)  TempSrc: Oral Oral  Oral  SpO2: 94% 93%  93%  Weight: 83.9 kg (185 lb)  87 kg (191 lb 11.2 oz)   Height: 5\' 4"  (1.626 m)       Intake/Output Summary (Last 24 hours) at 02/12/16 1317 Last data filed at 02/12/16 0300  Gross per 24 hour  Intake              290 ml  Output                0 ml  Net              290 ml   Filed Weights   02/11/16 0258 02/11/16 1614 02/12/16 0509  Weight: 86.5 kg (190 lb 11.2 oz) 83.9 kg (185 lb) 87 kg (191 lb 11.2 oz)    General appearance: alert, cooperative, appears stated age and no distress Resp: clear to auscultation bilaterally Cardio: regular rate and rhythm, S1, S2 normal, no murmur, click, rub or gallop GI: soft, non-tender; bowel sounds normal; no masses,  no organomegaly Extremities: extremities normal, atraumatic, no cyanosis or edema Neurologic: Awake  and alert. Oriented 3. No cranial nerve deficits. Motor strength is equal bilateral upper and lower extremities.  Lab Results:  Data Reviewed: I have personally reviewed following labs and imaging studies  CBC:  Recent Labs Lab 02/10/16 1539 02/10/16 1844 02/10/16 1914 02/11/16 0600 02/12/16 0408  WBC 13.1* 11.2*  --  9.8 7.9  NEUTROABS  --   --   --  7.2  --   HGB 11.9* 10.9* 10.9* 9.8* 10.8*  HCT 35.5* 33.3* 32.0* 30.5* 32.7*  MCV 87.0 87.6  --  88.4 87.2  PLT 222 197  --  186 207    Basic Metabolic Panel:  Recent Labs Lab 02/10/16 1539 02/10/16 1844 02/10/16 1914 02/10/16 2216 02/11/16 0225 02/12/16 0408  NA 129* 133* 133* 131* 134* 132*  K 3.8 3.7 4.0 3.2* 3.5 4.1  CL 94* 102 103 102 105 100*  CO2 19* 18*  --  20* 20* 22  GLUCOSE 390* 300* 311* 238* 158* 187*  BUN 30* 26* 30* 21* 21* 10  CREATININE 1.18* 0.90 0.80 0.83 0.78 0.67  CALCIUM 9.2 8.1*  --  7.8* 7.8* 8.4*  MG  --   --   --   --  1.3* 1.7  PHOS  --   --   --   --  2.7 2.1*    GFR: Estimated Creatinine Clearance: 85.8 mL/min (by C-G formula based on SCr of 0.67 mg/dL).  Liver Function Tests:  Recent Labs Lab 02/10/16 1539  AST 27  ALT 16  ALKPHOS 77  BILITOT 0.3  PROT 6.4*  ALBUMIN 3.2*    HbA1C:  Recent Labs  02/10/16 2216  HGBA1C 12.0*    CBG:  Recent Labs Lab 02/11/16 1534 02/11/16 1709 02/11/16 2136 02/12/16 0821 02/12/16 1310  GLUCAP 237* 216* 182* 184* 212*    Lipid Profile:  Recent Labs  02/10/16 1844  CHOL 204*  HDL 30*  LDLCALC UNABLE TO CALCULATE IF TRIGLYCERIDE OVER 400 mg/dL  TRIG 161823*  CHOLHDL 6.8     Recent Results (from the past 240 hour(s))  Urine culture     Status: Abnormal (Preliminary result)   Collection Time: 02/10/16  4:50 PM  Result Value Ref Range Status   Specimen Description URINE, RANDOM  Final   Special Requests NONE  Final   Culture >=100,000 COLONIES/mL ESCHERICHIA COLI (A)  Final   Report Status PENDING  Incomplete    Blood Culture (routine x 2)     Status: None (Preliminary result)   Collection Time: 02/10/16  4:55 PM  Result Value Ref Range Status   Specimen Description BLOOD LEFT ANTECUBITAL  Final   Special Requests BOTTLES DRAWN AEROBIC AND ANAEROBIC 5CC  Final   Culture NO GROWTH < 24 HOURS  Final   Report Status PENDING  Incomplete  Blood Culture (routine x 2)     Status: None (Preliminary result)   Collection Time: 02/10/16  5:08 PM  Result Value Ref Range Status   Specimen Description BLOOD RIGHT ANTECUBITAL  Final   Special Requests BOTTLES DRAWN AEROBIC AND ANAEROBIC 5CC  Final   Culture NO GROWTH < 24 HOURS  Final   Report Status PENDING  Incomplete  MRSA PCR Screening     Status: None   Collection Time: 02/10/16  9:58 PM  Result Value Ref Range Status   MRSA by PCR NEGATIVE NEGATIVE Final    Comment:        The GeneXpert MRSA Assay (FDA approved for NASAL specimens only), is one component of a comprehensive MRSA colonization surveillance program. It is not intended to diagnose MRSA infection nor to guide or monitor treatment for MRSA infections.       Radiology Studies: [facility]  Result Date: 02/10/2016 CLINICAL DATA:  Pyelonephritis, back pain, nausea, body aches and fever for 2 days. History of diabetes and hypertension. EXAM: RENAL / URINARY TRACT ULTRASOUND COMPLETE COMPARISON:  None. FINDINGS: Right Kidney: Length: 12.5 cm. Echogenicity within normal limits. No mass or hydronephrosis visualized. Left Kidney: Length: 11.1 cm. Echogenicity within normal limits. No mass or hydronephrosis visualized. Bladder: Urinary bladder is well distended, punctate echogenic foci in the bladder suggests debris. IMPRESSION: Suspected debris within the urinary bladder. Otherwise negative renal ultrasound. Electronically Signed   By: Awilda Metroourtnay  Bloomer M.D.   On: 02/10/2016 19:54   Dg Chest Port 1 View  Result Date: 02/10/2016 CLINICAL DATA:  Fever, nausea, and body aches today,  hypertension, diabetes mellitus EXAM: PORTABLE CHEST 1 VIEW COMPARISON:  Portable exam 1657 hours compared to 11/12/2013 FINDINGS: Normal heart size, mediastinal contours, and pulmonary vascularity. Lungs clear. No pleural effusion or pneumothorax. Bones unremarkable. IMPRESSION: No acute abnormalities. Electronically Signed   By: Ulyses SouthwardMark  Boles M.D.   On: 02/10/2016 17:04     Medications:  Scheduled: . amitriptyline  100 mg Oral QHS  .  cefTRIAXone (ROCEPHIN)  IV  1 g Intravenous Q24H  . gabapentin  600 mg Oral TID  . heparin  5,000 Units Subcutaneous Q8H  . insulin aspart  0-15 Units Subcutaneous TID WC  . insulin aspart  0-5 Units Subcutaneous QHS  . insulin aspart protamine- aspart  15 Units Subcutaneous BID WC  . sertraline  100 mg Oral Daily   Continuous:  ZOX:WRUEAV chloride, acetaminophen, fentaNYL (SUBLIMAZE) injection  Assessment/Plan:  Active Problems:   Sepsis (HCC)   Diabetic ketoacidosis without coma associated with type 2 diabetes mellitus (HCC)   Lactic acidosis   Urinary tract infection without hematuria   Essential hypertension    Diabetic ketoacidosis Most likely secondary to UTI. Now resolved. Now on subcutaneous insulin. She also had lactic acidosis, which has resolved.   Insulin-dependent Diabetes mellitus HbA1c is 12. Her outpatient compliance appears to be questionable. Patient is requesting to be placed on 70/30 insulin. This has been done. Monitor CBG's.  Urinary tract infection with Escherichia coli. Urine cultures growing Escherichia coli. Sensitivities are pending. Continue ceftriaxone for now.  History of essential hypertension Patient is currently off of her antihypertensives as she had an episode of hypotension in the ICU. Continue to monitor.  Acute kidney injury. Most likely secondary to hypovolemia. Resolved. Renal ultrasound was unremarkable except for some debris in the bladder.  History of diabetic neuropathy. Continue home  medications.  Normocytic anemia. No evidence for overt bleeding. Continue to monitor hemoglobin.  DVT Prophylaxis: Subcutaneous heparin    Code Status: Full code  Family Communication: Discussed with the patient  Disposition Plan: Anticipate discharge tomorrow. Mobilize. PT evaluation.    LOS: 2 days   [facility]  [facility] Pager [phone] 02/12/2016, 1:17 PM  If 7PM-7AM, please contact night-coverage at [email or web address], password [facility]

## 2016-02-13 LAB — BASIC METABOLIC PANEL
ANION GAP: 14 (ref 5–15)
BUN: 8 mg/dL (ref 6–20)
CHLORIDE: 100 mmol/L — AB (ref 101–111)
CO2: 20 mmol/L — AB (ref 22–32)
Calcium: 8.6 mg/dL — ABNORMAL LOW (ref 8.9–10.3)
Creatinine, Ser: 0.6 mg/dL (ref 0.44–1.00)
GFR calc non Af Amer: >60 mL/min (ref >60)
Glucose, Bld: 127 mg/dL — ABNORMAL HIGH (ref 65–99)
POTASSIUM: 3.3 mmol/L — AB (ref 3.5–5.1)
Sodium: 134 mmol/L — ABNORMAL LOW (ref 135–145)

## 2016-02-13 LAB — GLUCOSE, CAPILLARY
Glucose-Capillary: 201 mg/dL — ABNORMAL HIGH (ref 65–99)
Glucose-Capillary: 182 mg/dL — ABNORMAL HIGH (ref 65–99)

## 2016-02-13 LAB — URINE CULTURE: Culture: >=100000 — AB

## 2016-02-13 MED ORDER — POTASSIUM CHLORIDE CRYS ER 20 MEQ PO TBCR: 20.0000 meq | EXTENDED_RELEASE_TABLET | Freq: Every day | ORAL | 0 refills | Status: DC

## 2016-02-13 MED ORDER — CEPHALEXIN 500 MG PO CAPS: 500.0000 mg | ORAL_CAPSULE | Freq: Three times a day (TID) | ORAL | 0 refills | Status: AC

## 2016-02-13 MED ORDER — "INSULIN SYRINGE/NEEDLE 27G X 1/2"" 0.5 ML MISC": 1 refills | Status: DC

## 2016-02-13 MED ORDER — POTASSIUM CHLORIDE CRYS ER 20 MEQ PO TBCR
40.0000 meq | EXTENDED_RELEASE_TABLET | Freq: Once | ORAL | Status: AC
  Administered 2016-02-13: 40 meq via ORAL
  Filled 2016-02-13: qty 2

## 2016-02-13 MED ORDER — CEPHALEXIN 500 MG PO CAPS
500.0000 mg | ORAL_CAPSULE | Freq: Three times a day (TID) | ORAL | Status: DC
  Administered 2016-02-13: 500 mg via ORAL
  Filled 2016-02-13: qty 1

## 2016-02-13 MED ORDER — CEPHALEXIN 500 MG PO CAPS: 500.0000 mg | ORAL_CAPSULE | Freq: Three times a day (TID) | ORAL | Status: DC

## 2016-02-13 MED ORDER — INSULIN NPH ISOPHANE & REGULAR (70-30) 100 UNIT/ML ~~LOC~~ SUSP: 18.0000 [IU] | Freq: Two times a day (BID) | SUBCUTANEOUS | 2 refills | Status: DC

## 2016-02-13 NOTE — Care Management Note (Signed)
Case Management Note  Patient Details  Name: Anna Wagner MRN: [id number] Date of Birth: [birth year]  Subjective/Objective:      Pt with past medical history of Bell's palsy, DM, HTN, nueropathy, and L transmetatarsal amputaion who presented to ED with fever and dysuria found to be septic. From home with husband. Independent with ADL's and no DME usage. [facility] and [facility].    Action/Plan: Discharge to home today. Pt will call [facility] on Monday for post hospital f/u appointment.  Expected Discharge Date:   02/13/2016           Expected Discharge Plan:  Home/Self Care  In-House Referral:     Discharge planning Services  CM Consult  Status of Service:  Completed, signed off  If discussed at Long Length of Stay Meetings, dates discussed:    Additional Comments:  Epifanio LeschesCole, Ervie Mccard Hudson, RN 02/13/2016, 4:35 PM

## 2016-02-13 NOTE — Progress Notes (Signed)
NURSING PROGRESS NOTE  [name] [id number] Discharge Data: 02/13/2016 5:42 PM Attending Provider: Osvaldo ShipperGokul Krishnan, MD WUJ:WJXBJY,NWGNFPCP:FULLER,SUSAN, NP     [name] to be D/C'd Home per MD order.  Discussed with the patient the After Visit Summary and all questions fully answered. All IV's discontinued with no bleeding noted. All belongings returned to patient for patient to take home.   Last Vital Signs:  Blood pressure 140/82, pulse 89, temperature 98.2 F (36.8 C), temperature source Oral, resp. rate 18, height 5\' 4"  (1.626 m), weight 87 kg (191 lb 11.2 oz), SpO2 97 %.  Discharge Medication List   Medication List    STOP taking these medications   HYDROcodone-acetaminophen 5-325 MG tablet Commonly known as:  NORCO   TOUJEO SOLOSTAR 300 UNIT/ML Sopn Generic drug:  Insulin Glargine     TAKE these medications   acetaminophen 500 MG tablet Commonly known as:  TYLENOL Take 1,000 mg by mouth every 6 (six) hours as needed for fever.   amitriptyline 100 MG tablet Commonly known as:  ELAVIL Take 100 mg by mouth at bedtime.   aspirin EC 81 MG tablet Take 81 mg by mouth at bedtime.   cephALEXin 500 MG capsule Commonly known as:  KEFLEX Take 1 capsule (500 mg total) by mouth every 8 (eight) hours.   gabapentin 600 MG tablet Commonly known as:  NEURONTIN Take 600 mg by mouth 3 (three) times daily.   hydrochlorothiazide 25 MG tablet Commonly known as:  HYDRODIURIL Take 1 tablet (25 mg total) by mouth daily.   INS SYRINGE/NEEDLE .5CC/27G 27G X 1/2" 0.5 ML Misc Use as directed   insulin NPH-regular Human (70-30) 100 UNIT/ML injection Commonly known as:  NOVOLIN 70/30 RELION Inject 18 Units into the skin 2 (two) times daily with a meal. What changed:  how much to take   lisinopril 10 MG tablet Commonly known as:  PRINIVIL,ZESTRIL Take 10 mg by mouth daily.   metFORMIN 500 MG tablet Commonly known as:  GLUCOPHAGE Take 500 mg by mouth 2 (two) times daily with a meal.    metoprolol tartrate 25 MG tablet Commonly known as:  LOPRESSOR TAKE ONE TABLET BY MOUTH TWICE DAILY What changed:  See the new instructions.   potassium chloride SA 20 MEQ tablet Commonly known as:  K-DUR,KLOR-CON Take 1 tablet (20 mEq total) by mouth daily.   sertraline 100 MG tablet Commonly known as:  ZOLOFT Take 100 mg by mouth daily.   VITAMIN B-12 PO Take 1 tablet by mouth 2 (two) times daily.

## 2016-02-13 NOTE — Evaluation (Signed)
Physical Therapy Evaluation  Patient Details Name: Anna Wagner MRN: [id number] DOB: [birth year] Today's Date: 02/12/2016   History of Present Illness  Anna Wagner is a 55 year old female with past medical history of Bell's palsy, DM, HTN, nueropathy, and L transmetatarsal amputaion who presented to ED with fever and dysuria found to be septic.   Clinical Impression  Patient presents with decreased independence with mobility due to deficits listed in PT problem list.  She will benefit form skilled PT in the acute setting to allow return home with family support.  Reports no insurance so declines follow up PT at this time.      Follow Up Recommendations No PT follow up    Equipment Recommendations  None recommended by PT    Recommendations for Other Services       Precautions / Restrictions Precautions Precautions: Fall      Mobility  Bed Mobility Overal bed mobility: Modified Independent                Transfers Overall transfer level: Needs assistance Equipment used: None Transfers: Sit to/from Stand Sit to Stand: Supervision         General transfer comment: for safety  Ambulation/Gait Ambulation/Gait assistance: Supervision Ambulation Distance (Feet): 225 Feet Assistive device: Rolling walker (2 wheeled) Gait Pattern/deviations: Step-through pattern;Decreased dorsiflexion - right;Decreased stride length;Wide base of support     General Gait Details: foot drop on R and dragging toes, no LOB, but with some mild unsteadiness improved with walker  Stairs            Wheelchair Mobility    Modified Rankin (Stroke Patients Only)       Balance Overall balance assessment: Needs assistance   Sitting balance-Leahy Scale: Good       Standing balance-Leahy Scale: Fair Standing balance comment: stataic standing no balance issues, with ambulation safer with device                             Pertinent Vitals/Pain Pain Assessment:  No/denies pain    Home Living Family/patient expects to be discharged to:: Private residence Living Arrangements: Spouse/significant other Available Help at Discharge: Family;Available 24 hours/day Type of Home: House Home Access: Ramped entrance     Home Layout: One level Home Equipment: Walker - 2 wheels;Shower seat;Cane - single point      Prior Function Level of Independence: Independent         Comments: reports falls due to R foot drop; could not afford brace or shoe with insert for L transmet amputation     Hand Dominance        Extremity/Trunk Assessment     RUE Deficits / Details: not formally tested, but pt reports weaker from previous stroke (not in PMH in chart)         Lower Extremity Assessment: RLE deficits/detail RLE Deficits / Details: ankle DF 2/5       Communication   Communication: No difficulties  Cognition Arousal/Alertness: Awake/alert Behavior During Therapy: WFL for tasks assessed/performed Overall Cognitive Status: Within Functional Limits for tasks assessed                      General Comments General comments (skin integrity, edema, etc.): discussed fall prevention and pt reports has assist at home    Exercises     Assessment/Plan    PT Assessment Patient needs continued PT services  PT Problem  List Decreased balance;Decreased knowledge of use of DME;Decreased mobility;Decreased safety awareness          PT Treatment Interventions DME instruction;Gait training;Therapeutic exercise;Patient/family education;Therapeutic activities;Functional mobility training;Balance training    PT Goals (Current goals can be found in the Care Plan section)  Acute Rehab PT Goals Patient Stated Goal: To return to independent PT Goal Formulation: With patient Time For Goal Achievement: 02/20/16 Potential to Achieve Goals: Good    Frequency Min 3X/week   Barriers to discharge        Co-evaluation               End of  Session Equipment Utilized During Treatment: Gait belt Activity Tolerance: Patient tolerated treatment well Patient left: in bed;with call bell/phone within reach           Time: 1530-1557 PT Time Calculation (min) (ACUTE ONLY): 27 min   Charges:   PT Evaluation $PT Eval Moderate Complexity: 1 Procedure PT Treatments $Gait Training: 8-22 mins   PT G CodesElray Mcgregor:        Cynthia Wynn 02/13/2016, 8:34 AM  Sheran Lawlessyndi Wynn, PT [phone] 02/13/2016

## 2016-02-13 NOTE — Discharge Summary (Signed)
[facility]  Physician Discharge Summary   Patient ID: Anna Wagner MRN: [id number] DOB/AGE: 03/19/1960 55 y.o.  Admit date: 02/10/2016 Discharge date: 02/13/2016  PCP: Tomi Bamberger, NP  DISCHARGE DIAGNOSES:  Active Problems:   Sepsis (HCC)   Diabetic ketoacidosis without coma associated with type 2 diabetes mellitus (HCC)   Lactic acidosis   Urinary tract infection without hematuria   Essential hypertension   RECOMMENDATIONS FOR OUTPATIENT FOLLOW UP: 1. Case manager attempting to get the patient [facility]   DISCHARGE CONDITION: fair  Diet recommendation: Modified carbohydrate  Filed Weights   02/11/16 0258 02/11/16 1614 02/12/16 0509  Weight: 86.5 kg (190 lb 11.2 oz) 83.9 kg (185 lb) 87 kg (191 lb 11.2 oz)    INITIAL HISTORY: 55 year old Caucasian female with a past medical history of diabetes, hypertension, peripheral neuropathy, presented to the emergency department with complaints of fever and dysuria. Patient was found to have a urinary tract infection. She was also noted to be in diabetic ketoacidosis. She was hospitalized to the intensive care unit for further management.   HOSPITAL COURSE:   Diabetic ketoacidosis Most likely secondary to UTI. Now resolved. Now on subcutaneous insulin. She also had lactic acidosis, which has resolved.   Insulin-dependent Diabetes mellitus HbA1c is 12. Her outpatient compliance appears to be questionable. Patient is requesting to be placed on 70/30 insulin. This has been done.   Urinary tract infection with Escherichia coli. Patient was initially placed on ceftriaxone. Urine cultures growing Escherichia coli. Sensitivities are reviewed. Patient will be discharged on Keflex.  History of essential hypertension Patient did have an episode of hypotension in the ICU. Her antihypertensives held. Blood pressure has improved and now in the hypertensive range. Okay to resume her  antihypertensives.   Acute kidney injury. Most likely secondary to hypovolemia. Resolved. Renal ultrasound was unremarkable except for some debris in the bladder.  History of diabetic neuropathy. Continue home medications.  Normocytic anemia. No evidence for overt bleeding.   Overall, stable and improved. Patient has been ambulating without any difficulties. Okay for discharge home today.   PERTINENT LABS:  The results of significant diagnostics from this hospitalization (including imaging, microbiology, ancillary and laboratory) are listed below for reference.    Microbiology: Recent Results (from the past 240 hour(s))  Urine culture     Status: Abnormal   Collection Time: 02/10/16  4:50 PM  Result Value Ref Range Status   Specimen Description URINE, RANDOM  Final   Special Requests NONE  Final   Culture >=100,000 COLONIES/mL ESCHERICHIA COLI (A)  Final   Report Status 02/13/2016 FINAL  Final   Organism ID, Bacteria ESCHERICHIA COLI (A)  Final      Susceptibility   Escherichia coli - MIC*    AMPICILLIN 4 SENSITIVE Sensitive     CEFAZOLIN <=4 SENSITIVE Sensitive     CEFTRIAXONE <=1 SENSITIVE Sensitive     CIPROFLOXACIN <=0.25 SENSITIVE Sensitive     GENTAMICIN <=1 SENSITIVE Sensitive     IMIPENEM <=0.25 SENSITIVE Sensitive     NITROFURANTOIN <=16 SENSITIVE Sensitive     TRIMETH/SULFA <=20 SENSITIVE Sensitive     AMPICILLIN/SULBACTAM <=2 SENSITIVE Sensitive     PIP/TAZO <=4 SENSITIVE Sensitive     Extended ESBL NEGATIVE Sensitive     * >=100,000 COLONIES/mL ESCHERICHIA COLI  Blood Culture (routine x 2)     Status: None (Preliminary result)   Collection Time: 02/10/16  4:55 PM  Result Value Ref Range Status  Specimen Description BLOOD LEFT ANTECUBITAL  Final   Special Requests BOTTLES DRAWN AEROBIC AND ANAEROBIC 5CC  Final   Culture NO GROWTH 3 DAYS  Final   Report Status PENDING  Incomplete  Blood Culture (routine x 2)     Status: None (Preliminary result)    Collection Time: 02/10/16  5:08 PM  Result Value Ref Range Status   Specimen Description BLOOD RIGHT ANTECUBITAL  Final   Special Requests BOTTLES DRAWN AEROBIC AND ANAEROBIC 5CC  Final   Culture NO GROWTH 3 DAYS  Final   Report Status PENDING  Incomplete  MRSA PCR Screening     Status: None   Collection Time: 02/10/16  9:58 PM  Result Value Ref Range Status   MRSA by PCR NEGATIVE NEGATIVE Final    Comment:        The GeneXpert MRSA Assay (FDA approved for NASAL specimens only), is one component of a comprehensive MRSA colonization surveillance program. It is not intended to diagnose MRSA infection nor to guide or monitor treatment for MRSA infections.      Labs: Basic Metabolic Panel:  Recent Labs Lab 02/10/16 1844 02/10/16 1914 02/10/16 2216 02/11/16 0225 02/12/16 0408 02/13/16 0340  NA 133* 133* 131* 134* 132* 134*  K 3.7 4.0 3.2* 3.5 4.1 3.3*  CL 102 103 102 105 100* 100*  CO2 18*  --  20* 20* 22 20*  GLUCOSE 300* 311* 238* 158* 187* 127*  BUN 26* 30* 21* 21* 10 8  CREATININE 0.90 0.80 0.83 0.78 0.67 0.60  CALCIUM 8.1*  --  7.8* 7.8* 8.4* 8.6*  MG  --   --   --  1.3* 1.7  --   PHOS  --   --   --  2.7 2.1*  --    Liver Function Tests:  Recent Labs Lab 02/10/16 1539  AST 27  ALT 16  ALKPHOS 77  BILITOT 0.3  PROT 6.4*  ALBUMIN 3.2*   CBC:  Recent Labs Lab 02/10/16 1539 02/10/16 1844 02/10/16 1914 02/11/16 0600 02/12/16 0408  WBC 13.1* 11.2*  --  9.8 7.9  NEUTROABS  --   --   --  7.2  --   HGB 11.9* 10.9* 10.9* 9.8* 10.8*  HCT 35.5* 33.3* 32.0* 30.5* 32.7*  MCV 87.0 87.6  --  88.4 87.2  PLT 222 197  --  186 207    CBG:  Recent Labs Lab 02/12/16 1310 02/12/16 1748 02/12/16 2127 02/13/16 0827 02/13/16 1155  GLUCAP 212* 201* 144* 201* 182*     IMAGING STUDIES [place] Renal  Result Date: 02/10/2016 CLINICAL DATA:  Pyelonephritis, back pain, nausea, body aches and fever for 2 days. History of diabetes and hypertension. EXAM: RENAL /  URINARY TRACT ULTRASOUND COMPLETE COMPARISON:  None. FINDINGS: Right Kidney: Length: 12.5 cm. Echogenicity within normal limits. No mass or hydronephrosis visualized. Left Kidney: Length: 11.1 cm. Echogenicity within normal limits. No mass or hydronephrosis visualized. Bladder: Urinary bladder is well distended, punctate echogenic foci in the bladder suggests debris. IMPRESSION: Suspected debris within the urinary bladder. Otherwise negative renal ultrasound. Electronically Signed   By: Awilda Metroourtnay  Bloomer M.D.   On: 02/10/2016 19:54   Dg Chest Port 1 View  Result Date: 02/10/2016 CLINICAL DATA:  Fever, nausea, and body aches today, hypertension, diabetes mellitus EXAM: PORTABLE CHEST 1 VIEW COMPARISON:  Portable exam 1657 hours compared to 11/12/2013 FINDINGS: Normal heart size, mediastinal contours, and pulmonary vascularity. Lungs clear. No pleural effusion or pneumothorax. Bones unremarkable. IMPRESSION: No  acute abnormalities. Electronically Signed   By: Ulyses Southward M.D.   On: 02/10/2016 17:04    DISCHARGE EXAMINATION: Vitals:   02/12/16 0511 02/12/16 1614 02/12/16 2129 02/13/16 0325  BP: 125/71 (!) 162/85 (!) 124/58 140/82  Pulse: 99 (!) 101 91 89  Resp: 16 18 16 18   Temp: 98.4 F (36.9 C)  98.5 F (36.9 C) 98.2 F (36.8 C)  TempSrc: Oral   Oral  SpO2: 93%  97% 97%  Weight:      Height:       General appearance: alert, cooperative, appears stated age and no distress Cardio: regular rate and rhythm, S1, S2 normal, no murmur, click, rub or gallop GI: soft, non-tender; bowel sounds normal; no masses,  no organomegaly Extremities: extremities normal, atraumatic, no cyanosis or edema Neurologic: Alert and oriented X 3, normal strength and tone. Normal symmetric reflexes. Normal coordination and gait  DISPOSITION: Home  Discharge Instructions    Call MD for:  difficulty breathing, headache or visual disturbances    Complete by:  As directed    Call MD for:  extreme fatigue    Complete  by:  As directed    Call MD for:  persistant dizziness or light-headedness    Complete by:  As directed    Call MD for:  persistant nausea and vomiting    Complete by:  As directed    Call MD for:  redness, tenderness, or signs of infection (pain, swelling, redness, odor or green/yellow discharge around incision site)    Complete by:  As directed    Call MD for:  severe uncontrolled pain    Complete by:  As directed    Call MD for:  temperature >100.4    Complete by:  As directed    Diet Carb Modified    Complete by:  As directed    Discharge instructions    Complete by:  As directed    Please be sure to follow-up with her primary care provider. Continue to monitor your blood glucose levels at home. Take your medications as prescribed.  You were cared for by a hospitalist during your hospital stay. If you have any questions about your discharge medications or the care you received while you were in the hospital after you are discharged, you can call the unit and asked to speak with the hospitalist on call if the hospitalist that took care of you is not available. Once you are discharged, your primary care physician will handle any further medical issues. Please note that NO REFILLS for any discharge medications will be authorized once you are discharged, as it is imperative that you return to your primary care physician (or establish a relationship with a primary care physician if you do not have one) for your aftercare needs so that they can reassess your need for medications and monitor your lab values. If you do not have a primary care physician, you can call [phone] for a physician referral.   Increase activity slowly    Complete by:  As directed       ALLERGIES: No Known Allergies   Current Discharge Medication List    START taking these medications   Details  cephALEXin (KEFLEX) 500 MG capsule Take 1 capsule (500 mg total) by mouth every 8 (eight) hours. Qty: 15 capsule,  Refills: 0    INS SYRINGE/NEEDLE .5CC/27G 27G X 1/2" 0.5 ML MISC Use as directed Qty: 10 each, Refills: 1    potassium chloride SA (K-DUR,KLOR-CON)  20 MEQ tablet Take 1 tablet (20 mEq total) by mouth daily. Qty: 30 tablet, Refills: 0      CONTINUE these medications which have CHANGED   Details  insulin NPH-regular Human (NOVOLIN 70/30 RELION) (70-30) 100 UNIT/ML injection Inject 18 Units into the skin 2 (two) times daily with a meal. Qty: 10 mL, Refills: 2      CONTINUE these medications which have NOT CHANGED   Details  acetaminophen (TYLENOL) 500 MG tablet Take 1,000 mg by mouth every 6 (six) hours as needed for fever.    amitriptyline (ELAVIL) 100 MG tablet Take 100 mg by mouth at bedtime.    aspirin EC 81 MG tablet Take 81 mg by mouth at bedtime.     Cyanocobalamin (VITAMIN B-12 PO) Take 1 tablet by mouth 2 (two) times daily.    gabapentin (NEURONTIN) 600 MG tablet Take 600 mg by mouth 3 (three) times daily.    hydrochlorothiazide (HYDRODIURIL) 25 MG tablet Take 1 tablet (25 mg total) by mouth daily. Qty: 30 tablet, Refills: 3    lisinopril (PRINIVIL,ZESTRIL) 10 MG tablet Take 10 mg by mouth daily.    metFORMIN (GLUCOPHAGE) 500 MG tablet Take 500 mg by mouth 2 (two) times daily with a meal.     metoprolol tartrate (LOPRESSOR) 25 MG tablet TAKE ONE TABLET BY MOUTH TWICE DAILY Qty: 60 tablet, Refills: 0    sertraline (ZOLOFT) 100 MG tablet Take 100 mg by mouth daily.      STOP taking these medications     Insulin Glargine (TOUJEO SOLOSTAR) 300 UNIT/ML SOPN      HYDROcodone-acetaminophen (NORCO) 5-325 MG per tablet           TOTAL DISCHARGE TIME: 35 minutes  [facility] Hospital Of ToledoKRISHNAN,Zerenity Bowron  [facility] Pager [phone]  02/13/2016, 2:24 PM

## 2016-02-15 LAB — CULTURE, BLOOD (ROUTINE X 2)
CULTURE: NO GROWTH
CULTURE: NO GROWTH

## 2016-02-24 ENCOUNTER — Encounter (HOSPITAL_COMMUNITY): Payer: Self-pay | Admitting: Emergency Medicine

## 2016-02-24 ENCOUNTER — Ambulatory Visit (HOSPITAL_COMMUNITY)
Admission: EM | Admit: 2016-02-24 | Discharge: 2016-02-24 | Disposition: A | Discharge status: Home / Self Care | Payer: Self-pay | Attending: Family Medicine | Admitting: Family Medicine

## 2016-02-24 DIAGNOSIS — N390 Urinary tract infection, site not specified: Secondary | ICD-10-CM | POA: Insufficient documentation

## 2016-02-24 DIAGNOSIS — R300 Dysuria: Secondary | ICD-10-CM

## 2016-02-24 LAB — POCT URINALYSIS DIP (DEVICE)
BILIRUBIN URINE: NEGATIVE
Glucose, UA: NEGATIVE mg/dL
HGB URINE DIPSTICK: NEGATIVE
KETONES UR: NEGATIVE mg/dL
Nitrite: NEGATIVE
Protein, ur: NEGATIVE mg/dL
SPECIFIC GRAVITY, URINE: 1.02 (ref 1.005–1.030)
Urobilinogen, UA: 0.2 mg/dL (ref 0.0–1.0)
pH: 5.5 (ref 5.0–8.0)

## 2016-02-24 NOTE — ED Provider Notes (Signed)
[facility]    CSN: [id number] Arrival date & time: 02/24/16  1001     History   Chief Complaint Chief Complaint  Patient presents with  . Urinary Tract Infection    [name] is a 55 y.o. female.   The history is provided by the patient.  Urinary Tract Infection  Pain quality:  Burning Pain severity:  Mild Onset quality:  Sudden Duration:  1 day Progression:  Partially resolved Chronicity:  Recurrent Recent urinary tract infections: yes (recently hosp for uti and diabetes out of control)   Relieved by:  None tried Worsened by:  Nothing Ineffective treatments:  None tried Urinary symptoms: foul-smelling urine and frequent urination   Associated symptoms: no abdominal pain, no fever, no flank pain, no nausea and no vomiting   Risk factors: recurrent urinary tract infections     Past Medical History:  Diagnosis Date  . Bell's palsy 2005  . Diabetes mellitus without complication (HCC)   . Hypertension   . Neuromuscular disorder (HCC)    NEUROPATHY  . Sepsis (HCC) 02/11/2016    Patient Active Problem List   Diagnosis Date Noted  . Sepsis (HCC) 02/10/2016  . Diabetic ketoacidosis without coma associated with type 2 diabetes mellitus (HCC)   . Lactic acidosis   . Urinary tract infection without hematuria   . Essential hypertension   . 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 Hypokalemia 03/15/2014  . Foot abscess, left 03/15/2014  . Wound infection 03/14/2014  . Diabetic foot ulcer (HCC) 03/14/2014  . DM type 2 (diabetes mellitus, type 2) (HCC) 03/14/2014  . Cellulitis of left foot 03/14/2014  . Bell's palsy   . VAGINITIS NOS 09/30/2006  . DEPRESSION 08/05/2006  . Hereditary and idiopathic peripheral neuropathy 08/05/2006  . HYPERTENSION 08/05/2006  . INSOMNIA 08/05/2006  . BELL'S PALSY, HX OF 08/05/2006  . HYSTERECTOMY, HX OF 08/05/2006  . CHOLECYSTECTOMY, HX OF 08/05/2006    Past Surgical History:  Procedure  Laterality Date  . AMPUTATION Left 03/15/2014   Procedure: AMPUTATION TRANSMETATARSAL;  Surgeon: Nadara Mustard, MD;  Location[facility] OR;  Service: Orthopedics;  Laterality: Left;  . CESAREAN SECTION    . CHOLECYSTECTOMY      OB History    No data available       Home Medications    Prior to Admission medications   Medication Sig Start Date End Date Taking? Authorizing Provider  acetaminophen (TYLENOL) 500 MG tablet Take 1,000 mg by mouth every 6 (six) hours as needed for fever.    Historical Provider, MD  amitriptyline (ELAVIL) 100 MG tablet Take 100 mg by mouth at bedtime.    Historical Provider, MD  aspirin EC 81 MG tablet Take 81 mg by mouth at bedtime.     Historical Provider, MD  Cyanocobalamin (VITAMIN B-12 PO) Take 1 tablet by mouth 2 (two) times daily.    Historical Provider, MD  gabapentin (NEURONTIN) 600 MG tablet Take 600 mg by mouth 3 (three) times daily.    Historical Provider, MD  hydrochlorothiazide (HYDRODIURIL) 25 MG tablet Take 1 tablet (25 mg total) by mouth daily. 08/23/12   Donita Brooks, MD  INS SYRINGE/NEEDLE .5CC/27G 27G X 1/2" 0.5 ML MISC Use as directed 02/13/16   Osvaldo Shipper, MD  insulin NPH-regular Human (NOVOLIN 70/30 RELION) (70-30) 100 UNIT/ML injection Inject 18 Units into the skin 2 (two) times daily with a meal. 02/13/16   Osvaldo Shipper, MD  lisinopril (PRINIVIL,ZESTRIL) 10 MG  tablet Take 10 mg by mouth daily.    Historical Provider, MD  metFORMIN (GLUCOPHAGE) 500 MG tablet Take 500 mg by mouth 2 (two) times daily with a meal.     Historical Provider, MD  metoprolol tartrate (LOPRESSOR) 25 MG tablet TAKE ONE TABLET BY MOUTH TWICE DAILY Patient taking differently: Take 25 mg by mouth two times a day 10/07/14   Reather LittlerAjay Kumar, MD  potassium chloride SA (K-DUR,KLOR-CON) 20 MEQ tablet Take 1 tablet (20 mEq total) by mouth daily. 02/13/16   Osvaldo ShipperGokul Krishnan, MD  sertraline (ZOLOFT) 100 MG tablet Take 100 mg by mouth daily.    Historical Provider, MD    Family  History Family History  Problem Relation Age of Onset  . Heart attack Mother   . Diabetes Father   . Diabetes Sister     Social History Social History  Substance Use Topics  . Smoking status: Former Smoker    Packs/day: 1.00    Years: 38.00    Types: Cigarettes    Quit date: 09/19/2013  . Smokeless tobacco: Never Used  . Alcohol use No     Allergies   Patient has no known allergies.   Review of Systems Review of Systems  Constitutional: Negative.  Negative for chills and fever.  Gastrointestinal: Negative for abdominal pain, nausea and vomiting.  Genitourinary: Negative for dysuria, flank pain, frequency and pelvic pain.  All other systems reviewed and are negative.    Physical Exam Triage Vital Signs ED Triage Vitals  Enc Vitals Group     BP      Pulse      Resp      Temp      Temp src      SpO2      Weight      Height      Head Circumference      Peak Flow      Pain Score      Pain Loc      Pain Edu?      Excl. in GC?    No data found.   Updated Vital Signs There were no vitals taken for this visit.  Visual Acuity Right Eye Distance:   Left Eye Distance:   Bilateral Distance:    Right Eye Near:   Left Eye Near:    Bilateral Near:     Physical Exam  Constitutional: She is oriented to person, place, and time. She appears well-developed and well-nourished. No distress.  HENT:  Head: Normocephalic.  Neck: Normal range of motion. Neck supple.  Cardiovascular: Normal rate and regular rhythm.   Pulmonary/Chest: Effort normal.  Abdominal: Soft. Bowel sounds are normal. She exhibits no mass. There is no tenderness. There is no guarding.  Lymphadenopathy:    She has no cervical adenopathy.  Neurological: She is alert and oriented to person, place, and time.  Skin: Skin is warm and dry.  Nursing note and vitals reviewed.    UC Treatments / Results  Labs (all labs ordered are listed, but only abnormal results are displayed) Labs Reviewed    URINE CULTURE  u/a neg   EKG  EKG Interpretation None       Radiology No results found.  Procedures Procedures (including critical care time)  Medications Ordered in UC Medications - No data to display   Initial Impression / Assessment and Plan / UC Course  I have reviewed the triage vital signs and the nursing notes.  Pertinent labs & imaging results that  were available during my care of the patient were reviewed by me and considered in my medical decision making (see chart for details).  Clinical Course       Final Clinical Impressions(s) / UC Diagnoses   Final diagnoses:  None    New Prescriptions New Prescriptions   No medications on file     Linna HoffJames D Kejuan Bekker, MD 02/24/16 1054

## 2016-02-24 NOTE — Discharge Instructions (Signed)
Drink plenty of water, empty reg, return as needed

## 2016-02-24 NOTE — ED Triage Notes (Signed)
Patient urinated last night and noticed extreme pain, pain she identifies as a uti pain.  No back pain and no known fever.  Patient discharged 12/1 after admission for sepsis

## 2016-02-25 ENCOUNTER — Ambulatory Visit: Payer: Self-pay | Attending: Internal Medicine | Admitting: Physician Assistant

## 2016-02-25 VITALS — BP 127/79 | HR 105 | Temp 99.1°F | Resp 18 | Ht 64.0 in | Wt 184.0 lb

## 2016-02-25 DIAGNOSIS — E1169 Type 2 diabetes mellitus with other specified complication: Secondary | ICD-10-CM

## 2016-02-25 DIAGNOSIS — N3000 Acute cystitis without hematuria: Secondary | ICD-10-CM

## 2016-02-25 DIAGNOSIS — N390 Urinary tract infection, site not specified: Secondary | ICD-10-CM | POA: Insufficient documentation

## 2016-02-25 DIAGNOSIS — E1165 Type 2 diabetes mellitus with hyperglycemia: Secondary | ICD-10-CM | POA: Insufficient documentation

## 2016-02-25 DIAGNOSIS — E1140 Type 2 diabetes mellitus with diabetic neuropathy, unspecified: Secondary | ICD-10-CM | POA: Insufficient documentation

## 2016-02-25 DIAGNOSIS — Z794 Long term (current) use of insulin: Secondary | ICD-10-CM

## 2016-02-25 DIAGNOSIS — I10 Essential (primary) hypertension: Secondary | ICD-10-CM

## 2016-02-25 LAB — GLUCOSE, POCT (MANUAL RESULT ENTRY): POC GLUCOSE: 224 mg/dL — AB (ref 70–99)

## 2016-02-25 MED ORDER — CIPROFLOXACIN HCL 500 MG PO TABS: 500.0000 mg | ORAL_TABLET | Freq: Two times a day (BID) | ORAL | 0 refills | Status: DC

## 2016-02-25 NOTE — Progress Notes (Signed)
Anna RowanMarsha Wagner  UJW:[id number]  FAO:[id number]  DOB - 11-03-1960  Chief Complaint  Patient presents with  . Hospitalization Follow-up       Subjective:   Anna Wagner is a 55 y.o. female here today for establishment of care. She has a PMHx of  HTN, DM2 with complications/neuropathy, hyperlipidemia and depression. She presented to the ED on 02/10/16 with dysuria, fever and chills. She also had right sided flank pain. In the ED, glucose 446. NA 129. CR 1.18. U/A abnormal. Lactic acid elevated. Admitted by the Im team and treated for DKA and urosepsis. Responded to IVFs, Insulin and Antibiotics. It had been 5-6 months since sh e had been seen by a provider. She states that her NP had surgery and she has not seen anyone since. Sent home with NPH 70/30 18 Units BID and Keflex.   On yesterday went to an Urgent Care with more dysuria. U/A showed only trace leuks. A culture was sent and came back with E. Coli again. Her sxs persist. She had not been given the updated culture results.   Has been checking her sugar 2 times per day but has not written the numbers down. States she has only been under 200 on one occasion. Concerned about the sugar being so high especially with her complications in past.   ROS: GEN: denies fever or chills, denies change in weight Skin: denies lesions or rashes HEENT: denies headache, earache, epistaxis, sore throat, or neck pain LUNGS: denies SHOB, dyspnea, PND, orthopnea CV: denies CP or palpitations ABD: denies abd pain, N or V EXT: denies muscle spasms or swelling; no pain in lower ext, no weakness NEURO: denies numbness or tingling, denies sz, stroke or TIA  ALLERGIES: No Known Allergies  PAST MEDICAL HISTORY: Past Medical History:  Diagnosis Date  . Bell's palsy 2005  . Diabetes mellitus without complication (HCC)   . Hypertension   . Neuromuscular disorder (HCC)    NEUROPATHY  . Sepsis (HCC) 02/11/2016    PAST SURGICAL HISTORY: Past Surgical  History:  Procedure Laterality Date  . AMPUTATION Left 03/15/2014   Procedure: AMPUTATION TRANSMETATARSAL;  Surgeon: Nadara MustardMarcus Duda V, MD;  [facility];  Service: Orthopedics;  Laterality: Left;  . CESAREAN SECTION    . CHOLECYSTECTOMY      MEDICATIONS AT HOME: Prior to Admission medications   Medication Sig Start Date End Date Taking? Authorizing Provider  acetaminophen (TYLENOL) 500 MG tablet Take 1,000 mg by mouth every 6 (six) hours as needed for fever.   Yes Historical Provider, MD  amitriptyline (ELAVIL) 100 MG tablet Take 100 mg by mouth at bedtime.   Yes Historical Provider, MD  aspirin EC 81 MG tablet Take 81 mg by mouth at bedtime.    Yes Historical Provider, MD  Cyanocobalamin (VITAMIN B-12 PO) Take 1 tablet by mouth 2 (two) times daily.   Yes Historical Provider, MD  gabapentin (NEURONTIN) 600 MG tablet Take 600 mg by mouth 3 (three) times daily.   Yes Historical Provider, MD  hydrochlorothiazide (HYDRODIURIL) 25 MG tablet Take 1 tablet (25 mg total) by mouth daily. 08/23/12  Yes Donita BrooksWarren T Pickard, MD  INS SYRINGE/NEEDLE .5CC/27G 27G X 1/2" 0.5 ML MISC Use as directed 02/13/16  Yes Osvaldo ShipperGokul Krishnan, MD  insulin NPH-regular Human (NOVOLIN 70/30 RELION) (70-30) 100 UNIT/ML injection Inject 18 Units into the skin 2 (two) times daily with a meal. 02/13/16  Yes Osvaldo ShipperGokul Krishnan, MD  lisinopril (PRINIVIL,ZESTRIL) 10 MG tablet Take 10 mg by mouth daily.  Yes Historical Provider, MD  metFORMIN (GLUCOPHAGE) 500 MG tablet Take 500 mg by mouth 2 (two) times daily with a meal.    Yes Historical Provider, MD  metoprolol tartrate (LOPRESSOR) 25 MG tablet TAKE ONE TABLET BY MOUTH TWICE DAILY Patient taking differently: Take 25 mg by mouth two times a day 10/07/14  Yes Reather LittlerAjay Kumar, MD  potassium chloride SA (K-DUR,KLOR-CON) 20 MEQ tablet Take 1 tablet (20 mEq total) by mouth daily. 02/13/16  Yes Osvaldo ShipperGokul Krishnan, MD  sertraline (ZOLOFT) 100 MG tablet Take 100 mg by mouth daily.   Yes Historical Provider, MD    ciprofloxacin (CIPRO) 500 MG tablet Take 1 tablet (500 mg total) by mouth 2 (two) times daily. 02/25/16   Vivianne Masteriffany S Finlay Godbee, PA-C     Objective:   Vitals:   02/25/16 1530  BP: 127/79  Pulse: (!) 105  Resp: 18  Temp: 99.1 F (37.3 C)  TempSrc: Oral  SpO2: 97%  Weight: 184 lb (83.5 kg)  Height: 5\' 4"  (1.626 m)    Exam General appearance : Awake, alert, not in any distress. Speech Clear. Not toxic looking Chest:Good air entry bilaterally, no added sounds  CVS: S1 S2 regular, no murmurs.  Abdomen: Bowel sounds present, Non tender and not distended with no gaurding, rigidity or rebound. Extremities: B/L Lower Ext shows no edema, both legs are warm to touch Neurology: Awake alert, and oriented X 3, CN II-XII intact, Non focal   Data Review Lab Results  Component Value Date   HGBA1C 12.0 (H) 02/10/2016   HGBA1C 12.2 (H) 03/15/2014   HGBA1C 11.3 (H) 09/19/2013     Assessment & Plan  1. UTI  -Cipro 500 mg BID for 10 days  -1 week f/u with test of cure  2. DM2 insulin requiring with hyperglycemia   -Increase 70/30 22 Units  -bring log to next appt  -explained that some of the elevation could be due to the infection  -adequate hydration with water 3. HTN - controlled  -cont current regimen   Return in about 1 week (around 03/03/2016).  The patient was given clear instructions to go to ER or return to medical center if symptoms don't improve, worsen or new problems develop. The patient verbalized understanding. The patient was told to call to get lab results if they haven't heard anything in the next week.   This note has been created with Education officer, environmentalDragon speech recognition software and smart phrase technology. Any transcriptional errors are unintentional.    Scot Juniffany Guynell Kleiber, PA-C [facility] and [facility] [place], [place] [phone]   02/25/2016, 3:49 PM

## 2016-02-25 NOTE — Patient Instructions (Signed)
Drink plenty of water Antibiotic has ben changed Increase insulin to 22 Units twice daily Bring your sugar log back next week please

## 2016-02-25 NOTE — Progress Notes (Signed)
Patient is here for FU DM  Patient denies pain at this time.  Patient has taken medication today. Patient has eaten today.   

## 2016-02-27 LAB — URINE CULTURE: Culture: >=100000 — AB

## 2016-03-03 ENCOUNTER — Ambulatory Visit: Payer: Self-pay | Attending: Internal Medicine | Admitting: Pharmacist

## 2016-03-03 DIAGNOSIS — E119 Type 2 diabetes mellitus without complications: Secondary | ICD-10-CM | POA: Insufficient documentation

## 2016-03-03 DIAGNOSIS — E1169 Type 2 diabetes mellitus with other specified complication: Secondary | ICD-10-CM

## 2016-03-03 DIAGNOSIS — Z794 Long term (current) use of insulin: Secondary | ICD-10-CM | POA: Insufficient documentation

## 2016-03-03 MED ORDER — INSULIN NPH ISOPHANE & REGULAR (70-30) 100 UNIT/ML ~~LOC~~ SUSP: SUBCUTANEOUS | 2 refills | Status: DC

## 2016-03-03 NOTE — Progress Notes (Signed)
    S:    Patient arrives in good spirits with her mother.  Presents for diabetes evaluation, education, and management at the request of Tiffany Noel/Dr. Hyman HopesJegede. Patient was referred on 02/25/16.    Patient reports adherence with medications.  Current diabetes medications include: 70/30 insulin 22 units BID  Patient denies hypoglycemic events.  Patient reported dietary habits: Eats 3 meals a day. She is trying to eat more fruit and vegetables. Did have [place] food the other night for her birthday.   Patient reported exercise habits: none   Patient reports nocturia. UTI s/sx have resolved and she has completed her abx. Patient reports neuropathy. Patient reports visual changes. Patient reports self foot exams.   Patient was recently discharged from hospital and all medications have been reviewed.   O:  Lab Results  Component Value Date   HGBA1C 12.0 (H) 02/10/2016   There were no vitals filed for this visit.  Home fasting CBG: 200s-320s 2 hour post-prandial/random CBG: 118-220s   A/P: Diabetes longstanding currently UNcontrolled based on A1c of 12.0 and home CBGs. Patient denies hypoglycemic events and is able to verbalize appropriate hypoglycemia management plan. Patient reports adherence with medication. Control is suboptimal due to dietary indiscretion and sedentary lifestyle.  Increase 70/30 dose to 22 units in the morning and 26 units in the evening. Her evening blood glucose readings are much closer to goal than her fastings so I think just changing the evening dose is appropriate at this time as lower fasting levels will bring down the post-prandials as well.   UTI s/sx have all resolved. Discussed with Dr. Hyman HopesJegede - No further evaluation needed.   Next A1C anticipated March 2018.    Written patient instructions provided.  Total time in face to face counseling 20 minutes.   Follow up with new primary care provider in 2 weeks and if unable to get in - Pharmacist  Clinic Visit in 2 weeks

## 2016-03-03 NOTE — Patient Instructions (Signed)
Thanks for coming to see me!  Increase your night time dose of insulin to 26 units. Continue using 22 units in the morning.  Make an appointment for diabetes follow up with a new primary care doctor in 2 weeks.

## 2016-03-22 ENCOUNTER — Ambulatory Visit: Payer: Self-pay | Admitting: Family Medicine

## 2016-04-21 ENCOUNTER — Telehealth: Payer: Self-pay | Admitting: Internal Medicine

## 2016-04-21 MED ORDER — HYDROCHLOROTHIAZIDE 25 MG PO TABS: 25.0000 mg | ORAL_TABLET | Freq: Every day | ORAL | 0 refills | Status: DC

## 2016-04-21 MED ORDER — GABAPENTIN 600 MG PO TABS: 600.0000 mg | ORAL_TABLET | Freq: Three times a day (TID) | ORAL | 0 refills | Status: DC

## 2016-04-21 MED ORDER — SERTRALINE HCL 100 MG PO TABS: 100.0000 mg | ORAL_TABLET | Freq: Every day | ORAL | 0 refills | Status: DC

## 2016-04-21 NOTE — Telephone Encounter (Signed)
Refilled x 30 days - must have office visit for any further refills. 

## 2016-04-21 NOTE — Telephone Encounter (Signed)
Patient called the office to request medication refills for  hydrochlorothiazide (HYDRODIURIL) 25 MG tablet gabapentin (NEURONTIN) 600 MG tablet and sertraline (ZOLOFT) 100 MG tablet. [facility] [address]  Thank you.

## 2016-05-03 ENCOUNTER — Ambulatory Visit: Payer: Self-pay | Attending: Family Medicine | Admitting: Family Medicine

## 2016-05-03 ENCOUNTER — Encounter: Payer: Self-pay | Admitting: Family Medicine

## 2016-05-03 VITALS — BP 115/71 | HR 84 | Temp 98.6°F | Resp 18 | Ht 64.0 in | Wt 187.8 lb

## 2016-05-03 DIAGNOSIS — Z7982 Long term (current) use of aspirin: Secondary | ICD-10-CM | POA: Insufficient documentation

## 2016-05-03 DIAGNOSIS — E876 Hypokalemia: Secondary | ICD-10-CM | POA: Insufficient documentation

## 2016-05-03 DIAGNOSIS — F320 Major depressive disorder, single episode, mild: Secondary | ICD-10-CM | POA: Insufficient documentation

## 2016-05-03 DIAGNOSIS — G4709 Other insomnia: Secondary | ICD-10-CM

## 2016-05-03 DIAGNOSIS — Z794 Long term (current) use of insulin: Secondary | ICD-10-CM | POA: Insufficient documentation

## 2016-05-03 DIAGNOSIS — G4700 Insomnia, unspecified: Secondary | ICD-10-CM | POA: Insufficient documentation

## 2016-05-03 DIAGNOSIS — Z79899 Other long term (current) drug therapy: Secondary | ICD-10-CM | POA: Insufficient documentation

## 2016-05-03 DIAGNOSIS — E11319 Type 2 diabetes mellitus with unspecified diabetic retinopathy without macular edema: Secondary | ICD-10-CM | POA: Insufficient documentation

## 2016-05-03 DIAGNOSIS — E1140 Type 2 diabetes mellitus with diabetic neuropathy, unspecified: Secondary | ICD-10-CM | POA: Insufficient documentation

## 2016-05-03 DIAGNOSIS — I10 Essential (primary) hypertension: Secondary | ICD-10-CM | POA: Insufficient documentation

## 2016-05-03 DIAGNOSIS — E1149 Type 2 diabetes mellitus with other diabetic neurological complication: Secondary | ICD-10-CM

## 2016-05-03 LAB — GLUCOSE, POCT (MANUAL RESULT ENTRY): POC Glucose: 165 mg/dl — AB (ref 70–99)

## 2016-05-03 LAB — POCT GLYCOSYLATED HEMOGLOBIN (HGB A1C): HEMOGLOBIN A1C: 9.4

## 2016-05-03 MED ORDER — POTASSIUM CHLORIDE CRYS ER 20 MEQ PO TBCR: 20.0000 meq | EXTENDED_RELEASE_TABLET | Freq: Every day | ORAL | 3 refills | Status: DC

## 2016-05-03 MED ORDER — GABAPENTIN 600 MG PO TABS: 600.0000 mg | ORAL_TABLET | Freq: Three times a day (TID) | ORAL | 3 refills | Status: DC

## 2016-05-03 MED ORDER — METOPROLOL TARTRATE 25 MG PO TABS: 25.0000 mg | ORAL_TABLET | Freq: Two times a day (BID) | ORAL | 3 refills | Status: DC

## 2016-05-03 MED ORDER — AMITRIPTYLINE HCL 100 MG PO TABS: 100.0000 mg | ORAL_TABLET | Freq: Every day | ORAL | 3 refills | Status: DC

## 2016-05-03 MED ORDER — INSULIN NPH ISOPHANE & REGULAR (70-30) 100 UNIT/ML ~~LOC~~ SUSP: SUBCUTANEOUS | 3 refills | Status: DC

## 2016-05-03 MED ORDER — SERTRALINE HCL 100 MG PO TABS: 100.0000 mg | ORAL_TABLET | Freq: Every day | ORAL | 3 refills | Status: DC

## 2016-05-03 NOTE — Progress Notes (Signed)
Subjective:  Patient ID: Anna Wagner, female    DOB: [birth year]  Age: 56 y.o. MRN: [id number]  CC: Diabetes   [name] is a 56 year old female with a history of type 2 diabetes mellitus (A1c 9.4), diabetic neuropathy, hypertension, insomnia, depression who presents today to establish care with me as she was previously followed by the nurse practitioner who is no longer with the clinic.  She endorses compliance with her medications; random sugars are between 270 to 335 and fasting sugars in the 270 range despite compliance with her Novolin 70/30. She denies hypoglycemia. She suffers from diabetic neuropathy with numbness in her hands and feet and remains on gabapentin which reduces symptoms to 4/10.  She would like to be evaluated for possible thyroid nodules as she had seen endocrine-Dr. Reather LittlerKumar Ajay in 05/2015 and was informed she had thyroid nodules but she has no neck imaging in her chart.    Past Medical History:  Diagnosis Date  . Bell's palsy 2005  . Diabetes mellitus without complication (HCC)   . Hypertension   . Neuromuscular disorder (HCC)    NEUROPATHY  . Sepsis (HCC) 02/11/2016    Past Surgical History:  Procedure Laterality Date  . AMPUTATION Left 03/15/2014   Procedure: AMPUTATION TRANSMETATARSAL;  Surgeon: Nadara MustardMarcus Duda V, MD;  [facility];  Service: Orthopedics;  Laterality: Left;  . CESAREAN SECTION    . CHOLECYSTECTOMY      No Known Allergies   Outpatient Medications Prior to Visit  Medication Sig Dispense Refill  . acetaminophen (TYLENOL) 500 MG tablet Take 1,000 mg by mouth every 6 (six) hours as needed for fever.    Marland Kitchen. aspirin EC 81 MG tablet Take 81 mg by mouth at bedtime.     . Cyanocobalamin (VITAMIN B-12 PO) Take 1 tablet by mouth 2 (two) times daily.    . INS SYRINGE/NEEDLE .5CC/27G 27G X 1/2" 0.5 ML MISC Use as directed 10 each 1  . lisinopril (PRINIVIL,ZESTRIL) 10 MG tablet Take 10 mg by mouth daily.    . metFORMIN (GLUCOPHAGE) 500 MG  tablet Take 500 mg by mouth 2 (two) times daily with a meal.     . amitriptyline (ELAVIL) 100 MG tablet Take 100 mg by mouth at bedtime.    . gabapentin (NEURONTIN) 600 MG tablet Take 1 tablet (600 mg total) by mouth 3 (three) times daily. 90 tablet 0  . hydrochlorothiazide (HYDRODIURIL) 25 MG tablet Take 1 tablet (25 mg total) by mouth daily. 30 tablet 0  . insulin NPH-regular Human (NOVOLIN 70/30 RELION) (70-30) 100 UNIT/ML injection Inject 22 units every morning before breakfast and 26 units every evening before dinner. 10 mL 2  . metoprolol tartrate (LOPRESSOR) 25 MG tablet TAKE ONE TABLET BY MOUTH TWICE DAILY (Patient taking differently: Take 25 mg by mouth two times a day) 60 tablet 0  . sertraline (ZOLOFT) 100 MG tablet Take 1 tablet (100 mg total) by mouth daily. 30 tablet 0  . ciprofloxacin (CIPRO) 500 MG tablet Take 1 tablet (500 mg total) by mouth 2 (two) times daily. 20 tablet 0  . potassium chloride SA (K-DUR,KLOR-CON) 20 MEQ tablet Take 1 tablet (20 mEq total) by mouth daily. (Patient not taking: Reported on 05/03/2016) 30 tablet 0   No facility-administered medications prior to visit.     ROS Review of Systems  Constitutional: Negative for activity change, appetite change and fatigue.  HENT: Negative for congestion, sinus pressure and sore throat.   Eyes: Negative for  visual disturbance.  Respiratory: Negative for cough, chest tightness, shortness of breath and wheezing.   Cardiovascular: Negative for chest pain and palpitations.  Gastrointestinal: Negative for abdominal distention, abdominal pain and constipation.  Endocrine: Negative for polydipsia.  Genitourinary: Negative for dysuria and frequency.  Musculoskeletal: Negative for arthralgias and back pain.  Skin: Negative for rash.  Neurological: Positive for numbness. Negative for tremors and light-headedness.  Hematological: Does not bruise/bleed easily.  Psychiatric/Behavioral: Negative for agitation and behavioral  problems.    Objective:  BP 115/71 (BP Location: Right Arm, Patient Position: Sitting, Cuff Size: Large)   Pulse 84   Temp 98.6 F (37 C) (Oral)   Resp 18   Ht 5\' 4"  (1.626 m)   Wt 187 lb 12.8 oz (85.2 kg)   SpO2 99%   BMI 32.24 kg/m   BP/Weight 05/03/2016 02/25/2016 02/24/2016  Systolic BP 115 127 158  Diastolic BP 71 79 96  Wt. (Lbs) 187.8 184 -  BMI 32.24 31.58 -      Physical Exam  Constitutional: She is oriented to person, place, and time. She appears well-developed and well-nourished.  Cardiovascular: Normal rate, normal heart sounds and intact distal pulses.   No murmur heard. Pulmonary/Chest: Effort normal and breath sounds normal. She has no wheezes. She has no rales. She exhibits no tenderness.  Abdominal: Soft. Bowel sounds are normal. She exhibits no distension and no mass. There is no tenderness.  Musculoskeletal: Normal range of motion.  Neurological: She is alert and oriented to person, place, and time.  Skin: Skin is warm and dry.  Psychiatric: She has a normal mood and affect.     Lab Results  Component Value Date   HGBA1C 9.4 05/03/2016    CMP Latest Ref Rng & Units 02/13/2016 02/12/2016 02/11/2016  Glucose 65 - 99 mg/dL 161(W) 960(A) 540(J)  BUN 6 - 20 mg/dL 8 10 81(X)  Creatinine 0.44 - 1.00 mg/dL 9.14 7.82 9.56  Sodium 135 - 145 mmol/L 134(L) 132(L) 134(L)  Potassium 3.5 - 5.1 mmol/L 3.3(L) 4.1 3.5  Chloride 101 - 111 mmol/L 100(L) 100(L) 105  CO2 22 - 32 mmol/L 20(L) 22 20(L)  Calcium 8.9 - 10.3 mg/dL 2.1(H) 0.8(M) 7.8(L)  Total Protein 6.5 - 8.1 g/dL - - -  Total Bilirubin 0.3 - 1.2 mg/dL - - -  Alkaline Phos 38 - 126 U/L - - -  AST 15 - 41 U/L - - -  ALT 14 - 54 U/L - - -      Assessment & Plan:   1. Type 2 diabetes mellitus with retinopathy without macular edema, with long-term current use of insulin, unspecified laterality, unspecified retinopathy severity (HCC) Uncontrolled with A1c of 9.4 which is down from 12.2  previously Increased dose of Novolin 70/30 to 30 units twice daily ADA diet Discuss commencing statin at her next visit. - POCT A1C - Glucose (CBG) - Microalbumin/Creatinine Ratio, Urine - insulin NPH-regular Human (NOVOLIN 70/30 RELION) (70-30) 100 UNIT/ML injection; Inject 30 units every morning before breakfast and 30 units every evening before dinner.  Dispense: 10 mL; Refill: 3  2. Essential hypertension Controlled D/c HCTZ as blood pressure is on the low side - metoprolol tartrate (LOPRESSOR) 25 MG tablet; Take 1 tablet (25 mg total) by mouth 2 (two) times daily.  Dispense: 60 tablet; Refill: 3  3. Other diabetic neurological complication associated with type 2 diabetes mellitus (HCC) Improved on gabapentin - gabapentin (NEURONTIN) 600 MG tablet; Take 1 tablet (600 mg total) by mouth  3 (three) times daily.  Dispense: 90 tablet; Refill: 3  4. Other insomnia Stable - amitriptyline (ELAVIL) 100 MG tablet; Take 1 tablet (100 mg total) by mouth at bedtime.  Dispense: 30 tablet; Refill: 3  5. Depression, major, single episode, mild (HCC) Controlled - sertraline (ZOLOFT) 100 MG tablet; Take 1 tablet (100 mg total) by mouth daily.  Dispense: 30 tablet; Refill: 3  6. Hypokalemia Hypokalemia previously driven by his HCTZ We'll check CMET at next visit given his HCTZ was discontinued today - potassium chloride SA (K-DUR,KLOR-CON) 20 MEQ tablet; Take 1 tablet (20 mEq total) by mouth daily.  Dispense: 30 tablet; Refill: 3   Meds ordered this encounter  Medications  . potassium chloride SA (K-DUR,KLOR-CON) 20 MEQ tablet    Sig: Take 1 tablet (20 mEq total) by mouth daily.    Dispense:  30 tablet    Refill:  3  . metoprolol tartrate (LOPRESSOR) 25 MG tablet    Sig: Take 1 tablet (25 mg total) by mouth 2 (two) times daily.    Dispense:  60 tablet    Refill:  3  . insulin NPH-regular Human (NOVOLIN 70/30 RELION) (70-30) 100 UNIT/ML injection    Sig: Inject 30 units every morning  before breakfast and 30 units every evening before dinner.    Dispense:  10 mL    Refill:  3  . sertraline (ZOLOFT) 100 MG tablet    Sig: Take 1 tablet (100 mg total) by mouth daily.    Dispense:  30 tablet    Refill:  3  . gabapentin (NEURONTIN) 600 MG tablet    Sig: Take 1 tablet (600 mg total) by mouth 3 (three) times daily.    Dispense:  90 tablet    Refill:  3  . amitriptyline (ELAVIL) 100 MG tablet    Sig: Take 1 tablet (100 mg total) by mouth at bedtime.    Dispense:  30 tablet    Refill:  3    Follow-up: Return in about 1 month (around 05/31/2016) for Follow-up on hypokalemia.   Jaclyn Shaggy MD

## 2016-05-03 NOTE — Progress Notes (Signed)
Patient is here for DM FU  Patient complains of neuropathic pain in her hands and feet scaled currently at a 4.  Patient has taken medication today. Patient had a piece of bacon this morning.  Patient denies any suicidal ideations at this time.  Patient is asking if a refill is needed for the potassium and if the BP medications should be continued with her pressure being controlled.

## 2016-05-03 NOTE — Patient Instructions (Signed)
Diabetes Mellitus and Food It is important for you to manage your blood sugar (glucose) level. Your blood glucose level can be greatly affected by what you eat. Eating healthier foods in the appropriate amounts throughout the day at about the same time each day will help you control your blood glucose level. It can also help slow or prevent worsening of your diabetes mellitus. Healthy eating may even help you improve the level of your blood pressure and reach or maintain a healthy weight. General recommendations for healthful eating and cooking habits include:  Eating meals and snacks regularly. Avoid going long periods of time without eating to lose weight.  Eating a diet that consists mainly of plant-based foods, such as fruits, vegetables, nuts, legumes, and whole grains.  Using low-heat cooking methods, such as baking, instead of high-heat cooking methods, such as deep frying.  Work with your dietitian to make sure you understand how to use the Nutrition Facts information on food labels. How can food affect me? Carbohydrates Carbohydrates affect your blood glucose level more than any other type of food. Your dietitian will help you determine how many carbohydrates to eat at each meal and teach you how to count carbohydrates. Counting carbohydrates is important to keep your blood glucose at a healthy level, especially if you are using insulin or taking certain medicines for diabetes mellitus. Alcohol Alcohol can cause sudden decreases in blood glucose (hypoglycemia), especially if you use insulin or take certain medicines for diabetes mellitus. Hypoglycemia can be a life-threatening condition. Symptoms of hypoglycemia (sleepiness, dizziness, and disorientation) are similar to symptoms of having too much alcohol. If your health care provider has given you approval to drink alcohol, do so in moderation and use the following guidelines:  Women should not have more than one drink per day, and men  should not have more than two drinks per day. One drink is equal to: ? 12 oz of beer. ? 5 oz of wine. ? 1 oz of hard liquor.  Do not drink on an empty stomach.  Keep yourself hydrated. Have water, diet soda, or unsweetened iced tea.  Regular soda, juice, and other mixers might contain a lot of carbohydrates and should be counted.  What foods are not recommended? As you make food choices, it is important to remember that all foods are not the same. Some foods have fewer nutrients per serving than other foods, even though they might have the same number of calories or carbohydrates. It is difficult to get your body what it needs when you eat foods with fewer nutrients. Examples of foods that you should avoid that are high in calories and carbohydrates but low in nutrients include:  Trans fats (most processed foods list trans fats on the Nutrition Facts label).  Regular soda.  Juice.  Candy.  Sweets, such as cake, pie, doughnuts, and cookies.  Fried foods.  What foods can I eat? Eat nutrient-rich foods, which will nourish your body and keep you healthy. The food you should eat also will depend on several factors, including:  The calories you need.  The medicines you take.  Your weight.  Your blood glucose level.  Your blood pressure level.  Your cholesterol level.  You should eat a variety of foods, including:  Protein. ? Lean cuts of meat. ? Proteins low in saturated fats, such as fish, egg whites, and beans. Avoid processed meats.  Fruits and vegetables. ? Fruits and vegetables that may help control blood glucose levels, such as apples,   mangoes, and yams.  Dairy products. ? Choose fat-free or low-fat dairy products, such as milk, yogurt, and cheese.  Grains, bread, pasta, and rice. ? Choose whole grain products, such as multigrain bread, whole oats, and brown rice. These foods may help control blood pressure.  Fats. ? Foods containing healthful fats, such as  nuts, avocado, olive oil, canola oil, and fish.  Does everyone with diabetes mellitus have the same meal plan? Because every person with diabetes mellitus is different, there is not one meal plan that works for everyone. It is very important that you meet with a dietitian who will help you create a meal plan that is just right for you. This information is not intended to replace advice given to you by your health care provider. Make sure you discuss any questions you have with your health care provider. Document Released: 11/26/2004 Document Revised: 08/07/2015 Document Reviewed: 01/26/2013 Elsevier Interactive Patient Education  2017 [employer]  

## 2016-05-04 LAB — MICROALBUMIN / CREATININE URINE RATIO
Creatinine, Urine: 135 mg/dL (ref 20–320)
MICROALB/CREAT RATIO: 11 ug/mg{creat} (ref <30)
Microalb, Ur: 1.5 mg/dL

## 2016-05-10 ENCOUNTER — Ambulatory Visit: Payer: Self-pay | Attending: Family Medicine

## 2016-05-12 ENCOUNTER — Other Ambulatory Visit: Payer: Self-pay | Admitting: Pharmacist

## 2016-05-12 MED ORDER — LISINOPRIL 10 MG PO TABS: 10.0000 mg | ORAL_TABLET | Freq: Every day | ORAL | 0 refills | Status: DC

## 2016-06-16 ENCOUNTER — Telehealth: Payer: Self-pay

## 2016-06-16 NOTE — Telephone Encounter (Signed)
Pt contacted the office and states she needs her metformin refill. Pt states she would like it sent [facility] [place]. Please f/u

## 2016-06-17 MED ORDER — METFORMIN HCL 500 MG PO TABS: 500.0000 mg | ORAL_TABLET | Freq: Two times a day (BID) | ORAL | 0 refills | Status: DC

## 2016-06-17 NOTE — Telephone Encounter (Signed)
Writer called patient and LVM to inform her that Dr. Armen Pickup refilled her metformin for one month however she needs to call and schedule an appt with Dr. Venetia Night.  She is also to continue taking her novolin 70/30 U BID. Writer encouraged patient to call back to discuss.

## 2016-06-17 NOTE — Telephone Encounter (Signed)
Please call patient She is asked to scheduled f/u with PCP for diabetes and hypokalemia  Metformin refilled for one month supply, She is also to continue to take novoloin 70/30 30 U BID, reduce sugar intake in diet and increase exercise as tolerated.

## 2016-08-04 ENCOUNTER — Telehealth: Payer: Self-pay | Admitting: Family Medicine

## 2016-08-04 ENCOUNTER — Other Ambulatory Visit: Payer: Self-pay | Admitting: Family Medicine

## 2016-08-04 NOTE — Telephone Encounter (Signed)
PT called to request a refill for metFORMIN (GLUCOPHAGE) 500 MG tablet   Please follow up with PT

## 2016-08-24 ENCOUNTER — Other Ambulatory Visit: Payer: Self-pay

## 2016-08-24 ENCOUNTER — Encounter: Payer: Self-pay | Admitting: Family Medicine

## 2016-08-24 ENCOUNTER — Ambulatory Visit: Payer: Self-pay | Attending: Family Medicine | Admitting: Family Medicine

## 2016-08-24 VITALS — BP 145/86 | HR 88 | Temp 98.6°F | Resp 18 | Ht 64.0 in | Wt 192.0 lb

## 2016-08-24 DIAGNOSIS — R079 Chest pain, unspecified: Secondary | ICD-10-CM | POA: Insufficient documentation

## 2016-08-24 DIAGNOSIS — E11319 Type 2 diabetes mellitus with unspecified diabetic retinopathy without macular edema: Secondary | ICD-10-CM

## 2016-08-24 DIAGNOSIS — F320 Major depressive disorder, single episode, mild: Secondary | ICD-10-CM

## 2016-08-24 DIAGNOSIS — I10 Essential (primary) hypertension: Secondary | ICD-10-CM

## 2016-08-24 DIAGNOSIS — N3000 Acute cystitis without hematuria: Secondary | ICD-10-CM

## 2016-08-24 DIAGNOSIS — E876 Hypokalemia: Secondary | ICD-10-CM

## 2016-08-24 DIAGNOSIS — Z794 Long term (current) use of insulin: Secondary | ICD-10-CM

## 2016-08-24 DIAGNOSIS — Z89432 Acquired absence of left foot: Secondary | ICD-10-CM

## 2016-08-24 DIAGNOSIS — G4709 Other insomnia: Secondary | ICD-10-CM

## 2016-08-24 DIAGNOSIS — E1149 Type 2 diabetes mellitus with other diabetic neurological complication: Secondary | ICD-10-CM

## 2016-08-24 DIAGNOSIS — Z79899 Other long term (current) drug therapy: Secondary | ICD-10-CM

## 2016-08-24 DIAGNOSIS — Z1159 Encounter for screening for other viral diseases: Secondary | ICD-10-CM

## 2016-08-24 LAB — POCT URINALYSIS DIPSTICK
Bilirubin, UA: NEGATIVE
Ketones, UA: NEGATIVE
NITRITE UA: NEGATIVE
PROTEIN UA: 100
SPEC GRAV UA: 1.02 (ref 1.010–1.025)
UROBILINOGEN UA: 0.2 U/dL
pH, UA: 5 (ref 5.0–8.0)

## 2016-08-24 LAB — GLUCOSE, POCT (MANUAL RESULT ENTRY): POC Glucose: 290 mg/dl — AB (ref 70–99)

## 2016-08-24 LAB — POCT GLYCOSYLATED HEMOGLOBIN (HGB A1C): HEMOGLOBIN A1C: 10.1

## 2016-08-24 MED ORDER — METOPROLOL TARTRATE 25 MG PO TABS: 25.0000 mg | ORAL_TABLET | Freq: Two times a day (BID) | ORAL | 5 refills | Status: DC

## 2016-08-24 MED ORDER — SULFAMETHOXAZOLE-TRIMETHOPRIM 800-160 MG PO TABS: 1.0000 | ORAL_TABLET | Freq: Two times a day (BID) | ORAL | 0 refills | Status: DC

## 2016-08-24 MED ORDER — AMITRIPTYLINE HCL 100 MG PO TABS: 100.0000 mg | ORAL_TABLET | Freq: Every day | ORAL | 5 refills | Status: DC

## 2016-08-24 MED ORDER — GABAPENTIN 600 MG PO TABS: 600.0000 mg | ORAL_TABLET | Freq: Three times a day (TID) | ORAL | 5 refills | Status: DC

## 2016-08-24 MED ORDER — SERTRALINE HCL 100 MG PO TABS: 100.0000 mg | ORAL_TABLET | Freq: Every day | ORAL | 5 refills | Status: DC

## 2016-08-24 MED ORDER — INSULIN NPH ISOPHANE & REGULAR (70-30) 100 UNIT/ML ~~LOC~~ SUSP: SUBCUTANEOUS | 5 refills | Status: DC

## 2016-08-24 MED ORDER — LISINOPRIL 20 MG PO TABS: 20.0000 mg | ORAL_TABLET | Freq: Every day | ORAL | 5 refills | Status: DC

## 2016-08-24 MED ORDER — METFORMIN HCL 500 MG PO TABS: ORAL_TABLET | ORAL | 5 refills | Status: DC

## 2016-08-24 MED FILL — AMITRIPTYLINE HCL 100 MG TA: 100 | 30 days supply | Qty: 30 | Fill #0

## 2016-08-24 MED FILL — !NOVOLIN 70/30 100 UNITS/ML: (70-30) 100 | 13 days supply | Qty: 10 | Fill #0

## 2016-08-24 MED FILL — ?METOPROLOL 25 MG TABLET: 25 | 30 days supply | Qty: 60 | Fill #0

## 2016-08-24 MED FILL — ?METFORMIN HCL 500MG TABLET: 500 | 30 days supply | Qty: 60 | Fill #0

## 2016-08-24 MED FILL — SULFAMETHOXAZOLE/TMP DS TAB: 800-160 | 7 days supply | Qty: 14 | Fill #0

## 2016-08-24 MED FILL — LISINOPRIL 20 MG TABLET: 20 | 30 days supply | Qty: 30 | Fill #0

## 2016-08-24 NOTE — Progress Notes (Signed)
Patient is here for DM FU  Patient complains of hands and feet neuropathic pain. Pain is scaled at a 5.  Patient has taken medication today. Patient has not eaten today.

## 2016-08-24 NOTE — Progress Notes (Signed)
Subjective:  Patient ID: Anna Wagner, female    DOB: Jun 08, 1960  Age: 56 y.o. MRN: [id number]  CC: Diabetes   HPI HAEVYN URY is a 56 year old female with a history of type 2 diabetes mellitus (A1c 10.1), diabetic neuropathy, hypertension, insomnia, depression, transmetatarsal amputation of the left foot who presents today for a follow-up visit.  Her A1c has trended up from 9.4 to10.1 and she endorses noncompliance with diabetic diet but has been consistently taking 30 units of Novolin 70/30 and metformin twice daily. Her fasting sugars have been in the 200s and she denies hypoglycemia. She states she is an emotional eater and ingests lots of carbs. She suffers from neuropathy in her feet and hands, unstable gait and balance problems due to her previous left foot partial amputation. She denies visual complaints.  She has been compliant with her antihypertensive to control her blood pressure is elevated today.  medication list reveals she should be taking potassium supplements which she has not been doing due to the huge size of the pill.  Currently on amitriptyline which helps her insomnia and neuropathy. Doing well on her antidepressant and denies anxiety or depression and has no suicidal ideation or intent.  Past Medical History:  Diagnosis Date  . Bell's palsy 2005  . Diabetes mellitus without complication ([place])   . Hypertension   . Neuromuscular disorder ([place])    NEUROPATHY  . Sepsis ([place]) 02/11/2016    Past Surgical History:  Procedure Laterality Date  . AMPUTATION Left 03/15/2014   Procedure: AMPUTATION TRANSMETATARSAL;  Surgeon: Newt Minion, MD;  Location: [facility];  Service: Orthopedics;  Laterality: Left;  . CESAREAN SECTION    . CHOLECYSTECTOMY      No Known Allergies   Outpatient Medications Prior to Visit  Medication Sig Dispense Refill  . acetaminophen (TYLENOL) 500 MG tablet Take 1,000 mg by mouth every 6 (six) hours as needed for fever.    Marland Kitchen aspirin EC 81  MG tablet Take 81 mg by mouth at bedtime.     . Cyanocobalamin (VITAMIN B-12 PO) Take 1 tablet by mouth 2 (two) times daily.    . INS SYRINGE/NEEDLE .5CC/27G 27G X 1/2" 0.5 ML MISC Use as directed 10 each 1  . amitriptyline (ELAVIL) 100 MG tablet Take 1 tablet (100 mg total) by mouth at bedtime. 30 tablet 3  . gabapentin (NEURONTIN) 600 MG tablet Take 1 tablet (600 mg total) by mouth 3 (three) times daily. 90 tablet 3  . insulin NPH-regular Human (NOVOLIN 70/30 RELION) (70-30) 100 UNIT/ML injection Inject 30 units every morning before breakfast and 30 units every evening before dinner. 10 mL 3  . lisinopril (PRINIVIL,ZESTRIL) 10 MG tablet Take 1 tablet (10 mg total) by mouth daily. 90 tablet 0  . metFORMIN (GLUCOPHAGE) 500 MG tablet TAKE 1 TABLET BY MOUTH TWICE DAILY WITH A MEAL 60 tablet 0  . metoprolol tartrate (LOPRESSOR) 25 MG tablet Take 1 tablet (25 mg total) by mouth 2 (two) times daily. 60 tablet 3  . potassium chloride SA (K-DUR,KLOR-CON) 20 MEQ tablet Take 1 tablet (20 mEq total) by mouth daily. 30 tablet 3  . sertraline (ZOLOFT) 100 MG tablet Take 1 tablet (100 mg total) by mouth daily. 30 tablet 3   No facility-administered medications prior to visit.     ROS Review of Systems Constitutional: Negative for activity change, appetite change and fatigue.  HENT: Negative for congestion, sinus pressure and sore throat.   Eyes: Negative for visual disturbance.  Respiratory: Negative for cough, chest tightness, shortness of breath and wheezing.   Cardiovascular: Negative for chest pain and palpitations.  Gastrointestinal: Negative for abdominal distention, abdominal pain and constipation.  Endocrine: Negative for polydipsia.  Genitourinary: Negative for dysuria and frequency.  Musculoskeletal: Negative for arthralgias and back pain.  Skin: Negative for rash.  Neurological: Positive for numbness. Negative for tremors and light-headedness.  Hematological: Does not bruise/bleed easily.    Psychiatric/Behavioral: Negative for agitation and behavioral problems.  Objective:  BP (!) 145/86 (BP Location: Left Arm, Patient Position: Sitting, Cuff Size: Normal)   Pulse 88   Temp 98.6 F (37 C) (Oral)   Resp 18   Ht '5\' 4"'  (1.626 m)   Wt 192 lb (87.1 kg)   SpO2 98%   BMI 32.96 kg/m   BP/Weight 08/24/2016 05/03/2016 20/35/5974  Systolic BP 163 845 364  Diastolic BP 86 71 79  Wt. (Lbs) 192 187.8 184  BMI 32.96 32.24 31.58      Physical Exam Constitutional: She is oriented to person, place, and time. She appears well-developed and well-nourished.  Cardiovascular: Normal rate, normal heart sounds and intact distal pulses.   No murmur heard. Pulmonary/Chest: Effort normal and breath sounds normal. She has no wheezes. She has no rales. She exhibits no tenderness.  Abdominal: Soft. Bowel sounds are normal. She exhibits no distension and no mass. There is no tenderness.  Musculoskeletal: Normal range of motion. Left transmetatarsal amputation  Neurological: She is alert and oriented to person, place, and time.  significantly decreased sensation in both feet Skin: Skin is warm and dry.  Psychiatric: She has a normal mood and affect  CMP Latest Ref Rng & Units 02/13/2016 02/12/2016 02/11/2016  Glucose 65 - 99 mg/dL 127(H) 187(H) 158(H)  BUN 6 - 20 mg/dL 8 10 21(H)  Creatinine 0.44 - 1.00 mg/dL 0.60 0.67 0.78  Sodium 135 - 145 mmol/L 134(L) 132(L) 134(L)  Potassium 3.5 - 5.1 mmol/L 3.3(L) 4.1 3.5  Chloride 101 - 111 mmol/L 100(L) 100(L) 105  CO2 22 - 32 mmol/L 20(L) 22 20(L)  Calcium 8.9 - 10.3 mg/dL 8.6(L) 8.4(L) 7.8(L)  Total Protein 6.5 - 8.1 g/dL - - -  Total Bilirubin 0.3 - 1.2 mg/dL - - -  Alkaline Phos 38 - 126 U/L - - -  AST 15 - 41 U/L - - -  ALT 14 - 54 U/L - - -    Lipid Panel     Component Value Date/Time   CHOL 204 (H) 02/10/2016 1844   TRIG 823 (H) 02/10/2016 1844   HDL 30 (L) 02/10/2016 1844   CHOLHDL 6.8 02/10/2016 1844   VLDL UNABLE TO CALCULATE IF  TRIGLYCERIDE OVER 400 mg/dL 02/10/2016 1844   LDLCALC UNABLE TO CALCULATE IF TRIGLYCERIDE OVER 400 mg/dL 02/10/2016 1844     Lab Results  Component Value Date   HGBA1C 10.1 08/24/2016    Assessment & Plan:   1. Type 2 diabetes mellitus with retinopathy without macular edema, with long-term current use of insulin, unspecified laterality, unspecified retinopathy severity ([place]) Uncontrolled with A1c of 10.1 due to nonadherence with lifestyle modifications Increase Novolin 70/30 to 40 units in the morning and 35 units in the evening Strongly encouraged adherence with a diabetic diet  - POCT A1C - Glucose (CBG) - insulin NPH-regular Human (NOVOLIN 70/30 RELION) (70-30) 100 UNIT/ML injection; Inject 40 units every morning before breakfast and 35 units every evening before dinner.  Dispense: 10 mL; Refill: 5 - CMP14+EGFR - Lipid panel -  Urinalysis Dipstick  2. Other diabetic neurological complication associated with type 2 diabetes mellitus ([facility]) Stable - gabapentin (NEURONTIN) 600 MG tablet; Take 1 tablet (600 mg total) by mouth 3 (three) times daily.  Dispense: 90 tablet; Refill: 5  3. Essential hypertension Uncontrolled Increased dose of lisinopril Low-sodium diet - metoprolol tartrate (LOPRESSOR) 25 MG tablet; Take 1 tablet (25 mg total) by mouth 2 (two) times daily.  Dispense: 60 tablet; Refill: 5  4. Depression, major, single episode, mild ([facility]) Stable - sertraline (ZOLOFT) 100 MG tablet; Take 1 tablet (100 mg total) by mouth daily.  Dispense: 30 tablet; Refill: 5  5. Other insomnia Controlled - amitriptyline (ELAVIL) 100 MG tablet; Take 1 tablet (100 mg total) by mouth at bedtime.  Dispense: 30 tablet; Refill: 5  6. Status post transmetatarsal amputation of left foot ([place]) Secondary to poorly controlled diabetes mellitus This accounts for her unstable gait  7. Hypokalemia Has not been taking potassium replacement We'll make no regimen changes if she is hypokalemic as I  increased her dose of lisinopril today  8. Acute cystitis without hematuria Asymptomatic Will treat given she is a high risk patient - sulfamethoxazole-trimethoprim (BACTRIM DS) 800-160 MG tablet; Take 1 tablet by mouth 2 (two) times daily.  Dispense: 14 tablet; Refill: 0  9. Need for hepatitis C screening test - Hepatitis c antibody (reflex)  10. High risk medication use I have discussed the risk of QT prolongation with combination of Zoloft and amitriptyline She is insisting on remaining on amitriptyline EKG today reveals normal QT of 358   Meds ordered this encounter  Medications  . metFORMIN (GLUCOPHAGE) 500 MG tablet    Sig: TAKE 1 TABLET BY MOUTH TWICE DAILY WITH A MEAL    Dispense:  60 tablet    Refill:  5  . gabapentin (NEURONTIN) 600 MG tablet    Sig: Take 1 tablet (600 mg total) by mouth 3 (three) times daily.    Dispense:  90 tablet    Refill:  5  . metoprolol tartrate (LOPRESSOR) 25 MG tablet    Sig: Take 1 tablet (25 mg total) by mouth 2 (two) times daily.    Dispense:  60 tablet    Refill:  5  . lisinopril (PRINIVIL,ZESTRIL) 20 MG tablet    Sig: Take 1 tablet (20 mg total) by mouth daily.    Dispense:  30 tablet    Refill:  5    Discontinue previous dose  . sertraline (ZOLOFT) 100 MG tablet    Sig: Take 1 tablet (100 mg total) by mouth daily.    Dispense:  30 tablet    Refill:  5  . amitriptyline (ELAVIL) 100 MG tablet    Sig: Take 1 tablet (100 mg total) by mouth at bedtime.    Dispense:  30 tablet    Refill:  5  . insulin NPH-regular Human (NOVOLIN 70/30 RELION) (70-30) 100 UNIT/ML injection    Sig: Inject 40 units every morning before breakfast and 35 units every evening before dinner.    Dispense:  10 mL    Refill:  5  . sulfamethoxazole-trimethoprim (BACTRIM DS) 800-160 MG tablet    Sig: Take 1 tablet by mouth 2 (two) times daily.    Dispense:  14 tablet    Refill:  0    Follow-up: Return in about 3 weeks (around 09/14/2016) for complete physical  exam.   This note has been created with Surveyor, quantity. Any transcriptional errors  are unintentional.     Arnoldo Morale MD

## 2016-08-25 ENCOUNTER — Other Ambulatory Visit: Payer: Self-pay | Admitting: Family Medicine

## 2016-08-25 LAB — CMP14+EGFR
ALBUMIN: 4.1 g/dL (ref 3.5–5.5)
ALK PHOS: 95 IU/L (ref 39–117)
ALT: 19 IU/L (ref 0–32)
AST: 14 IU/L (ref 0–40)
Albumin/Globulin Ratio: 1.5 (ref 1.2–2.2)
BUN / CREAT RATIO: 30 — AB (ref 9–23)
BUN: 16 mg/dL (ref 6–24)
CHLORIDE: 100 mmol/L (ref 96–106)
CO2: 18 mmol/L — ABNORMAL LOW (ref 20–29)
Calcium: 9.9 mg/dL (ref 8.7–10.2)
Creatinine, Ser: 0.53 mg/dL — ABNORMAL LOW (ref 0.57–1.00)
GFR calc Af Amer: 124 mL/min/{1.73_m2} (ref >59)
GFR calc non Af Amer: 107 mL/min/{1.73_m2} (ref >59)
GLOBULIN, TOTAL: 2.8 g/dL (ref 1.5–4.5)
GLUCOSE: 247 mg/dL — AB (ref 65–99)
Potassium: 4.6 mmol/L (ref 3.5–5.2)
SODIUM: 136 mmol/L (ref 134–144)
Total Protein: 6.9 g/dL (ref 6.0–8.5)

## 2016-08-25 LAB — HCV COMMENT:

## 2016-08-25 LAB — LIPID PANEL
CHOLESTEROL TOTAL: 220 mg/dL — AB (ref 100–199)
Chol/HDL Ratio: 6.7 ratio — ABNORMAL HIGH (ref 0.0–4.4)
HDL: 33 mg/dL — ABNORMAL LOW (ref >39)
TRIGLYCERIDES: 753 mg/dL — AB (ref 0–149)

## 2016-08-25 LAB — HEPATITIS C ANTIBODY (REFLEX): HCV Ab: <0.1 s/co ratio (ref 0.0–0.9)

## 2016-08-25 MED ORDER — ATORVASTATIN CALCIUM 40 MG PO TABS: 40.0000 mg | ORAL_TABLET | Freq: Every day | ORAL | 3 refills | Status: DC

## 2016-08-25 MED FILL — ?ATORVASTATIN 40MG TABLET: 40 | 30 days supply | Qty: 30 | Fill #0

## 2016-09-05 ENCOUNTER — Other Ambulatory Visit: Payer: Self-pay | Admitting: Family Medicine

## 2016-09-05 DIAGNOSIS — E1149 Type 2 diabetes mellitus with other diabetic neurological complication: Secondary | ICD-10-CM

## 2016-09-06 MED FILL — GABAPENTIN 600 MG TABLET: 600 | 30 days supply | Qty: 90 | Fill #0

## 2016-09-20 MED FILL — !NOVOLIN 70/30 100 UNITS/ML: (70-30) 100 | 13 days supply | Qty: 10 | Fill #1

## 2016-09-21 MED FILL — AMITRIPTYLINE HCL 100 MG TA: 100 | 30 days supply | Qty: 30 | Fill #1

## 2016-09-21 MED FILL — ?ATORVASTATIN 40MG TABLET: 40 | 30 days supply | Qty: 30 | Fill #1

## 2016-09-28 ENCOUNTER — Ambulatory Visit: Payer: Self-pay | Attending: Family Medicine | Admitting: Family Medicine

## 2016-09-28 ENCOUNTER — Encounter: Payer: Self-pay | Admitting: Family Medicine

## 2016-09-28 VITALS — BP 142/87 | HR 84 | Temp 99.0°F | Resp 18 | Ht 64.0 in | Wt 195.0 lb

## 2016-09-28 DIAGNOSIS — E1140 Type 2 diabetes mellitus with diabetic neuropathy, unspecified: Secondary | ICD-10-CM | POA: Insufficient documentation

## 2016-09-28 DIAGNOSIS — Z23 Encounter for immunization: Secondary | ICD-10-CM

## 2016-09-28 DIAGNOSIS — Z0000 Encounter for general adult medical examination without abnormal findings: Secondary | ICD-10-CM | POA: Insufficient documentation

## 2016-09-28 DIAGNOSIS — I10 Essential (primary) hypertension: Secondary | ICD-10-CM | POA: Insufficient documentation

## 2016-09-28 DIAGNOSIS — Z79899 Other long term (current) drug therapy: Secondary | ICD-10-CM | POA: Insufficient documentation

## 2016-09-28 DIAGNOSIS — N393 Stress incontinence (female) (male): Secondary | ICD-10-CM | POA: Insufficient documentation

## 2016-09-28 DIAGNOSIS — Z1239 Encounter for other screening for malignant neoplasm of breast: Secondary | ICD-10-CM

## 2016-09-28 DIAGNOSIS — Z1231 Encounter for screening mammogram for malignant neoplasm of breast: Secondary | ICD-10-CM

## 2016-09-28 DIAGNOSIS — Z124 Encounter for screening for malignant neoplasm of cervix: Secondary | ICD-10-CM

## 2016-09-28 DIAGNOSIS — Z9049 Acquired absence of other specified parts of digestive tract: Secondary | ICD-10-CM | POA: Insufficient documentation

## 2016-09-28 DIAGNOSIS — Z794 Long term (current) use of insulin: Secondary | ICD-10-CM | POA: Insufficient documentation

## 2016-09-28 DIAGNOSIS — Z1211 Encounter for screening for malignant neoplasm of colon: Secondary | ICD-10-CM

## 2016-09-28 DIAGNOSIS — Z7982 Long term (current) use of aspirin: Secondary | ICD-10-CM | POA: Insufficient documentation

## 2016-09-28 MED ORDER — OXYBUTYNIN CHLORIDE 5 MG PO TABS: 5.0000 mg | ORAL_TABLET | Freq: Two times a day (BID) | ORAL | 3 refills | Status: DC

## 2016-09-28 MED FILL — OXYBUTYNIN 5 MG TABLET: 5 | 30 days supply | Qty: 60 | Fill #0

## 2016-09-28 NOTE — Patient Instructions (Signed)

## 2016-09-28 NOTE — Progress Notes (Signed)
Patient cbg 187 Patient has eaten Patient has taken medication  Patient A1C 11.2

## 2016-09-28 NOTE — Progress Notes (Signed)
Subjective:  Patient ID: Anna Wagner, female    DOB: October 30, 1960  Age: 56 y.o. MRN: [id number]  CC: Diabetes   HPI Samarah Hogle Macinnes presents for a complete physical. She complains of stress incontinence - when she coughs or strains.  Also stressed from her sons ill behavior, states she is coping , refusing counselling.  Past Medical History:  Diagnosis Date  . Bell's palsy 2005  . Diabetes mellitus without complication ([facility])   . Hypertension   . Neuromuscular disorder ([facility])    NEUROPATHY  . Sepsis ([facility]) 02/11/2016    Past Surgical History:  Procedure Laterality Date  . AMPUTATION Left 03/15/2014   Procedure: AMPUTATION TRANSMETATARSAL;  Surgeon: Nadara Mustard, MD;  Location: [facility] OR;  Service: Orthopedics;  Laterality: Left;  . CESAREAN SECTION    . CHOLECYSTECTOMY      No Known Allergies   Outpatient Medications Prior to Visit  Medication Sig Dispense Refill  . acetaminophen (TYLENOL) 500 MG tablet Take 1,000 mg by mouth every 6 (six) hours as needed for fever.    Marland Kitchen amitriptyline (ELAVIL) 100 MG tablet Take 1 tablet (100 mg total) by mouth at bedtime. 30 tablet 5  . aspirin EC 81 MG tablet Take 81 mg by mouth at bedtime.     Marland Kitchen atorvastatin (LIPITOR) 40 MG tablet Take 1 tablet (40 mg total) by mouth daily. 30 tablet 3  . Cyanocobalamin (VITAMIN B-12 PO) Take 1 tablet by mouth 2 (two) times daily.    Marland Kitchen gabapentin (NEURONTIN) 600 MG tablet TAKE 1 TABLET BY MOUTH THREE TIMES DAILY 90 tablet 0  . INS SYRINGE/NEEDLE .5CC/27G 27G X 1/2" 0.5 ML MISC Use as directed 10 each 1  . insulin NPH-regular Human (NOVOLIN 70/30 RELION) (70-30) 100 UNIT/ML injection Inject 40 units every morning before breakfast and 35 units every evening before dinner. 10 mL 5  . lisinopril (PRINIVIL,ZESTRIL) 20 MG tablet Take 1 tablet (20 mg total) by mouth daily. 30 tablet 5  . metFORMIN (GLUCOPHAGE) 500 MG tablet TAKE 1 TABLET BY MOUTH TWICE DAILY WITH A MEAL 60 tablet 5  . metoprolol tartrate (LOPRESSOR)  25 MG tablet Take 1 tablet (25 mg total) by mouth 2 (two) times daily. 60 tablet 5  . sertraline (ZOLOFT) 100 MG tablet Take 1 tablet (100 mg total) by mouth daily. 30 tablet 5  . sulfamethoxazole-trimethoprim (BACTRIM DS) 800-160 MG tablet Take 1 tablet by mouth 2 (two) times daily. 14 tablet 0   No facility-administered medications prior to visit.     ROS Review of Systems  Constitutional: Negative for activity change, appetite change and fatigue.  HENT: Negative for congestion, sinus pressure and sore throat.   Eyes: Negative for visual disturbance.  Respiratory: Negative for cough, chest tightness, shortness of breath and wheezing.   Cardiovascular: Negative for chest pain and palpitations.  Gastrointestinal: Negative for abdominal distention, abdominal pain and constipation.  Endocrine: Negative for polydipsia.  Genitourinary:       See hpi  Musculoskeletal: Negative for arthralgias and back pain.  Skin: Negative for rash.  Neurological: Negative for tremors, light-headedness and numbness.  Hematological: Does not bruise/bleed easily.  Psychiatric/Behavioral: Negative for agitation and behavioral problems.    Objective:  BP (!) 142/87 (BP Location: Right Arm, Patient Position: Sitting, Cuff Size: Normal)   Pulse 84   Temp 99 F (37.2 C) (Oral)   Resp 18   Ht 5\' 4"  (1.626 m)   Wt 195 lb (88.5 kg)   SpO2 98%  BMI 33.47 kg/m   BP/Weight 09/28/2016 08/24/2016 05/03/2016  Systolic BP 142 145 115  Diastolic BP 87 86 71  Wt. (Lbs) 195 192 187.8  BMI 33.47 32.96 32.24      Physical Exam  Constitutional: She is oriented to person, place, and time. She appears well-developed and well-nourished. No distress.  HENT:  Head: Normocephalic.  Right Ear: External ear normal.  Left Ear: External ear normal.  Nose: Nose normal.  Mouth/Throat: Oropharynx is clear and moist.  Eyes: Pupils are equal, round, and reactive to light. Conjunctivae and EOM are normal.  Neck: Normal  range of motion. No JVD present.  Cardiovascular: Normal rate, regular rhythm, normal heart sounds and intact distal pulses.  Exam reveals no gallop.   No murmur heard. Pulmonary/Chest: Effort normal and breath sounds normal. No respiratory distress. She has no wheezes. She has no rales. She exhibits no tenderness. Right breast exhibits no mass and no tenderness. Left breast exhibits no mass and no tenderness.  Abdominal: Soft. Bowel sounds are normal. She exhibits no distension and no mass. There is no tenderness.  Genitourinary:  Genitourinary Comments: Normal external genitalia, vagina, cervix, adnexa  Musculoskeletal: Normal range of motion. She exhibits no edema or tenderness.  L foot transmetatarsal amputation  Neurological: She is alert and oriented to person, place, and time. She has normal reflexes.  Skin: Skin is warm and dry. She is not diaphoretic.  Psychiatric: She has a normal mood and affect.     Assessment & Plan:   1. Annual physical exam   2. Screening for breast cancer - MM Digital Screening; Future  3. Screening for cervical cancer - Cytology - PAP [place]  4. Screening for colon cancer - Ambulatory referral to Gastroenterology  5. Need for Tdap vaccination - Tdap vaccine greater than or equal to 7yo IM  6. Stress incontinence of urine - oxybutynin (DITROPAN) 5 MG tablet; Take 1 tablet (5 mg total) by mouth 2 (two) times daily.  Dispense: 60 tablet; Refill: 3   Meds ordered this encounter  Medications  . DISCONTD: oxybutynin (DITROPAN) 5 MG tablet    Sig: Take 1 tablet (5 mg total) by mouth 2 (two) times daily.    Dispense:  60 tablet    Refill:  3  . oxybutynin (DITROPAN) 5 MG tablet    Sig: Take 1 tablet (5 mg total) by mouth 2 (two) times daily.    Dispense:  60 tablet    Refill:  3    Follow-up: Return in about 1 month (around 10/29/2016) for diabetic visit.   Jaclyn ShaggyEnobong Amao MD

## 2016-09-29 LAB — CYTOLOGY - PAP
DIAGNOSIS: NEGATIVE
HPV: NOT DETECTED

## 2016-10-01 ENCOUNTER — Other Ambulatory Visit: Payer: Self-pay | Admitting: Family Medicine

## 2016-10-01 DIAGNOSIS — E1149 Type 2 diabetes mellitus with other diabetic neurological complication: Secondary | ICD-10-CM

## 2016-10-01 MED FILL — !NOVOLIN 70/30 100 UNITS/ML: (70-30) 100 | 13 days supply | Qty: 10 | Fill #2

## 2016-10-04 MED FILL — LISINOPRIL 20 MG TAB: 20 | 30 days supply | Qty: 30 | Fill #1

## 2016-10-04 MED FILL — GABAPENTIN 600 MG TABLET: 600 | 30 days supply | Qty: 90 | Fill #0

## 2016-10-05 ENCOUNTER — Other Ambulatory Visit: Payer: Self-pay | Admitting: *Deleted

## 2016-10-05 ENCOUNTER — Ambulatory Visit: Payer: Self-pay | Attending: Family Medicine | Admitting: *Deleted

## 2016-10-05 DIAGNOSIS — R300 Dysuria: Secondary | ICD-10-CM | POA: Insufficient documentation

## 2016-10-05 LAB — POCT URINALYSIS DIPSTICK
Bilirubin, UA: NEGATIVE
Glucose, UA: 500
Ketones, UA: NEGATIVE
Leukocytes, UA: NEGATIVE
Nitrite, UA: NEGATIVE
PH UA: 6 (ref 5.0–8.0)
PROTEIN UA: NEGATIVE
RBC UA: NEGATIVE
SPEC GRAV UA: 1.015 (ref 1.010–1.025)
UROBILINOGEN UA: 0.2 U/dL

## 2016-10-05 MED ORDER — INSULIN NPH ISOPHANE & REGULAR (70-30) 100 UNIT/ML ~~LOC~~ SUSP: SUBCUTANEOUS | 3 refills | Status: DC

## 2016-10-05 NOTE — Telephone Encounter (Signed)
PRINTED FOR PASS PROGRAM 

## 2016-10-05 NOTE — Patient Instructions (Signed)
Patient is aware of results being negative for a UTI. Patient advised to schedule an OV for further concerns.

## 2016-10-05 NOTE — Telephone Encounter (Signed)
Alternate provider script schredded

## 2016-10-05 NOTE — Progress Notes (Signed)
Patient presents with dysuria

## 2016-10-13 MED FILL — ?METFORMIN HCL 500MG TABLET: 500 | 30 days supply | Qty: 60 | Fill #1

## 2016-10-18 MED FILL — !NOVOLIN 70/30 100 UNITS/ML: (70-30) 100 | 13 days supply | Qty: 10 | Fill #3

## 2016-10-21 MED FILL — METOPROLOL TARTRATE 25 MG T: 25 | 30 days supply | Qty: 60 | Fill #1

## 2016-10-21 MED FILL — AMITRIPTYLINE HCL 100 MG TA: 100 | 30 days supply | Qty: 30 | Fill #2

## 2016-10-21 MED FILL — ?ATORVASTATIN 40MG TABLET: 40 | 30 days supply | Qty: 30 | Fill #2

## 2016-10-29 ENCOUNTER — Ambulatory Visit: Payer: Self-pay | Admitting: Family Medicine

## 2016-10-29 ENCOUNTER — Other Ambulatory Visit: Payer: Self-pay | Admitting: *Deleted

## 2016-10-29 MED ORDER — "INSULIN SYRINGE/NEEDLE 27G X 1/2"" 0.5 ML MISC": 12 refills | Status: DC

## 2016-10-29 NOTE — Telephone Encounter (Signed)
Patients syringes were ordered.

## 2016-11-01 ENCOUNTER — Other Ambulatory Visit: Payer: Self-pay | Admitting: Family Medicine

## 2016-11-01 DIAGNOSIS — E1149 Type 2 diabetes mellitus with other diabetic neurological complication: Secondary | ICD-10-CM

## 2016-11-01 MED FILL — NOVOLIN 70/30 100 UNITS/ML: (70-30) 100 | 13 days supply | Qty: 10 | Fill #4

## 2016-11-01 MED FILL — OXYBUTYNIN 5 MG TABLET: 5 | 30 days supply | Qty: 60 | Fill #1

## 2016-11-01 MED FILL — GABAPENTIN 600 MG TABLET: 600 | 30 days supply | Qty: 90 | Fill #0

## 2016-11-02 MED FILL — TRUEPLUS SYR 0.5ML 30GX5/16: 30G X 5/16" | 30 days supply | Qty: 100 | Fill #0

## 2016-11-13 ENCOUNTER — Other Ambulatory Visit: Payer: Self-pay | Admitting: Family Medicine

## 2016-11-13 DIAGNOSIS — E11319 Type 2 diabetes mellitus with unspecified diabetic retinopathy without macular edema: Secondary | ICD-10-CM

## 2016-11-13 DIAGNOSIS — Z794 Long term (current) use of insulin: Principal | ICD-10-CM

## 2016-11-16 ENCOUNTER — Ambulatory Visit: Payer: Self-pay | Attending: Family Medicine | Admitting: Family Medicine

## 2016-11-16 ENCOUNTER — Ambulatory Visit: Payer: Self-pay | Admitting: Licensed Clinical Social Worker

## 2016-11-16 VITALS — BP 145/85 | HR 88 | Temp 98.1°F | Ht 64.0 in | Wt 195.0 lb

## 2016-11-16 DIAGNOSIS — Z7982 Long term (current) use of aspirin: Secondary | ICD-10-CM | POA: Insufficient documentation

## 2016-11-16 DIAGNOSIS — G709 Myoneural disorder, unspecified: Secondary | ICD-10-CM | POA: Insufficient documentation

## 2016-11-16 DIAGNOSIS — E1165 Type 2 diabetes mellitus with hyperglycemia: Secondary | ICD-10-CM | POA: Insufficient documentation

## 2016-11-16 DIAGNOSIS — E1140 Type 2 diabetes mellitus with diabetic neuropathy, unspecified: Secondary | ICD-10-CM | POA: Insufficient documentation

## 2016-11-16 DIAGNOSIS — N393 Stress incontinence (female) (male): Secondary | ICD-10-CM | POA: Insufficient documentation

## 2016-11-16 DIAGNOSIS — G4709 Other insomnia: Secondary | ICD-10-CM

## 2016-11-16 DIAGNOSIS — Z794 Long term (current) use of insulin: Secondary | ICD-10-CM | POA: Insufficient documentation

## 2016-11-16 DIAGNOSIS — I10 Essential (primary) hypertension: Secondary | ICD-10-CM | POA: Insufficient documentation

## 2016-11-16 DIAGNOSIS — G4700 Insomnia, unspecified: Secondary | ICD-10-CM | POA: Insufficient documentation

## 2016-11-16 DIAGNOSIS — E11319 Type 2 diabetes mellitus with unspecified diabetic retinopathy without macular edema: Secondary | ICD-10-CM | POA: Insufficient documentation

## 2016-11-16 DIAGNOSIS — Z0000 Encounter for general adult medical examination without abnormal findings: Secondary | ICD-10-CM

## 2016-11-16 DIAGNOSIS — F419 Anxiety disorder, unspecified: Principal | ICD-10-CM

## 2016-11-16 DIAGNOSIS — F329 Major depressive disorder, single episode, unspecified: Secondary | ICD-10-CM

## 2016-11-16 DIAGNOSIS — E1149 Type 2 diabetes mellitus with other diabetic neurological complication: Secondary | ICD-10-CM

## 2016-11-16 DIAGNOSIS — F320 Major depressive disorder, single episode, mild: Secondary | ICD-10-CM | POA: Insufficient documentation

## 2016-11-16 DIAGNOSIS — Z23 Encounter for immunization: Secondary | ICD-10-CM

## 2016-11-16 LAB — GLUCOSE, POCT (MANUAL RESULT ENTRY)
POC GLUCOSE: 301 mg/dL — AB (ref 70–99)
POC Glucose: 359 mg/dl — AB (ref 70–99)

## 2016-11-16 MED ORDER — INSULIN ASPART 100 UNIT/ML ~~LOC~~ SOLN
20.0000 [IU] | Freq: Once | SUBCUTANEOUS | Status: AC
  Administered 2016-11-16: 20 [IU] via SUBCUTANEOUS

## 2016-11-16 MED ORDER — GABAPENTIN 600 MG PO TABS: 600.0000 mg | ORAL_TABLET | Freq: Three times a day (TID) | ORAL | 5 refills | Status: DC

## 2016-11-16 MED ORDER — OXYBUTYNIN CHLORIDE 5 MG PO TABS: 5.0000 mg | ORAL_TABLET | Freq: Two times a day (BID) | ORAL | 3 refills | Status: DC

## 2016-11-16 MED ORDER — LISINOPRIL 20 MG PO TABS: 20.0000 mg | ORAL_TABLET | Freq: Every day | ORAL | 5 refills | Status: DC

## 2016-11-16 MED ORDER — DULOXETINE HCL 60 MG PO CPEP: 60.0000 mg | ORAL_CAPSULE | Freq: Every day | ORAL | 5 refills | Status: DC

## 2016-11-16 MED ORDER — INSULIN NPH ISOPHANE & REGULAR (70-30) 100 UNIT/ML ~~LOC~~ SUSP: SUBCUTANEOUS | 3 refills | Status: DC

## 2016-11-16 MED ORDER — ATORVASTATIN CALCIUM 40 MG PO TABS: 40.0000 mg | ORAL_TABLET | Freq: Every day | ORAL | 3 refills | Status: DC

## 2016-11-16 MED ORDER — AMITRIPTYLINE HCL 100 MG PO TABS: 100.0000 mg | ORAL_TABLET | Freq: Every day | ORAL | 5 refills | Status: DC

## 2016-11-16 MED ORDER — METOPROLOL TARTRATE 25 MG PO TABS: 25.0000 mg | ORAL_TABLET | Freq: Two times a day (BID) | ORAL | 5 refills | Status: DC

## 2016-11-16 MED FILL — DULoxetine HCL 60 MG CPEP: 60 | 30 days supply | Qty: 30 | Fill #0

## 2016-11-16 MED FILL — !HUMULIN 70/30 VIAL: 70/30 | 30 days supply | Qty: 30 | Fill #0

## 2016-11-16 NOTE — BH Specialist Note (Signed)
Integrated Behavioral Health Initial Visit  MRN: [id number] Name: Anna Wagner   Session Start time: 2:20 PM Session End time: 3:00 PM Total time: 40 minutes  Type of Service: Integrated Behavioral Health- Individual/Family Interpretor:No. Interpretor Name and Language: N.A.   Warm Hand Off Completed.       SUBJECTIVE: Anna Wagner is a 56 y.o. female accompanied by patient. Patient was referred by Dr. Venetia NightAmao for depression and anxiety. Patient reports the following symptoms/concerns: overwhelming feelings of sadness and worry, low energy, difficulty sleeping, feeling bad about self, difficulty concentrating, and irritability Duration of problem: Ongoing; Severity of problem: moderate  OBJECTIVE: Mood: Dysphoric and Affect: Tearful Risk of harm to self or others: No plan to harm self or others   LIFE CONTEXT: Family and Social: Pt resides with spouse of thirty-six years. She has four adult children and minor grandchildren. Pt receives family support School/Work: Pt's spouse is employed. Family is experiencing financial strain due to [employer] insurance premiums and pt's inability to work Self-Care: Pt applies ice packs and takes showers to assist in neuropathy. She tries to implement an exercise routine to assist in managing health Life Changes: Pt has difficulty managing ongoing medical concerns, financial strain, and stress regarding son's legal concerns.   GOALS ADDRESSED: Patient will reduce symptoms of: anxiety, depression and stress and increase knowledge and/or ability of: coping skills and healthy habits and also: Increase healthy adjustment to current life circumstances, Increase adequate support systems for patient/family and Improve medication compliance   INTERVENTIONS: Solution-Focused Strategies, Supportive Counseling, Psychoeducation and/or Health Education and Link to [facility]  Standardized Assessments completed: GAD-7 and PHQ 2&9  ASSESSMENT: Patient  currently experiencing depression and anxiety triggered by difficulty managing ongoing medical concerns, financial strain, and stress regarding children. She reports overwhelming feelings of sadness and worry, low energy, difficulty sleeping, feeling bad about self, difficulty concentrating, and irritability. Patient may benefit from psychoeducation and psychotherapy. LCSWA educated pt on the correlation between one's physical and mental health. LCSWA discussed benefits of applying healthy coping skills to decrease symptoms. Pt successfully identified healthy strategies to utilize on a routine basis. She is participating in medication management through PCP. LCSWA provided pt with resources for crisis intervention, psychotherapy, affordable low impact exercise classes, and information on support groups for Type 2 Diabetes.   PLAN: 1. Follow up with behavioral health clinician on : Pt was encouraged to contact LCSWA if symptoms worsen or fail to improve to [facility] 2. Behavioral recommendations: LCSWA recommends that pt apply healthy coping skills discussed, comply with medication management, and utilize provided resources. Pt is encouraged to schedule follow up appointment with LCSWA 3. Referral(s): Integrated [facility] manager[facility]al Health Services (In Clinic) and Community Mental Health Services (LME/Outside Clinic) 4. "From scale of 1-10, how likely are you to follow plan?": 8/10  Anna LarssonJasmine D Lewis, LCSW 11/18/16 4:57 PM

## 2016-11-16 NOTE — Progress Notes (Signed)
Subjective:  Patient ID: Anna Wagner, female    DOB: 04/26/1960  Age: 56 y.o. MRN: [id number]  CC: Diabetes   [name] is a 56 year old female with a history of type 2 diabetes mellitus (A1c 10.1), diabetic neuropathy, hypertension, insomnia, depression, transmetatarsal amputation of the left foot who presents today for a follow-up visit.  Her blood sugar log  reveals her sugars have been uncontrolled with fasting sugars ranging from 150-300 and she has had single values of 400. Her random sugars have been under 100s to upper 200s. She denies hypoglycemia. She states she is an emotional eater and ingests lots of carbs. She suffers from neuropathy in her feet and hands, unstable gait and balance problems due to her previous left foot partial amputation; symptoms are uncontrolled on gabapentin She denies visual complaints.  She has been compliant with her antihypertensive but her blood pressure is still elevated today.  Currently on amitriptyline which helps her insomnia.   She endorses being depressed and breaks down crying; she complains of her sons ill behavior which worsens her depression and coupled with the fact that her husband is the sole breadwinner of the family and this makes her anxious. She also has several other underlying family stressors. Zoloft appears on her medication list for depression however she has not been taking it. Seen in conjunction with LCSW who performed psychotherapy during the visit  Past Medical History:  Diagnosis Date  . Bell's palsy 2005  . Diabetes mellitus without complication (HCC)   . Hypertension   . Neuromuscular disorder (HCC)    NEUROPATHY  . Sepsis (HCC) 02/11/2016     Past Surgical History:  Procedure Laterality Date  . AMPUTATION Left 03/15/2014   Procedure: AMPUTATION TRANSMETATARSAL;  Surgeon: Nadara MustardMarcus Duda V, MD;  Location: [facility] OR;  Service: Orthopedics;  Laterality: Left;  . CESAREAN SECTION    . CHOLECYSTECTOMY      No  Known Allergies   . Outpatient Medications Prior to Visit  Medication Sig Dispense Refill  . acetaminophen (TYLENOL) 500 MG tablet Take 1,000 mg by mouth every 6 (six) hours as needed for fever.    Marland Kitchen. aspirin EC 81 MG tablet Take 81 mg by mouth at bedtime.     . Cyanocobalamin (VITAMIN B-12 PO) Take 1 tablet by mouth 2 (two) times daily.    . INS SYRINGE/NEEDLE .5CC/27G 27G X 1/2" 0.5 ML MISC Use as directed 10 each 12  . metFORMIN (GLUCOPHAGE) 500 MG tablet TAKE 1 TABLET BY MOUTH TWICE DAILY WITH A MEAL 60 tablet 5  . amitriptyline (ELAVIL) 100 MG tablet Take 1 tablet (100 mg total) by mouth at bedtime. 30 tablet 5  . atorvastatin (LIPITOR) 40 MG tablet Take 1 tablet (40 mg total) by mouth daily. 30 tablet 3  . gabapentin (NEURONTIN) 600 MG tablet TAKE 1 TABLET BY MOUTH 3 TIMES DAILY. 90 tablet 0  . insulin NPH-regular Human (HUMULIN 70/30) (70-30) 100 UNIT/ML injection Inject 40 units every morning BEFORE breakfast and 35 units every evening BEFORE dinner 90 mL 3  . insulin NPH-regular Human (NOVOLIN 70/30 RELION) (70-30) 100 UNIT/ML injection Inject 40 units every morning before breakfast and 35 units every evening before dinner. 10 mL 5  . lisinopril (PRINIVIL,ZESTRIL) 20 MG tablet Take 1 tablet (20 mg total) by mouth daily. 30 tablet 5  . metoprolol tartrate (LOPRESSOR) 25 MG tablet Take 1 tablet (25 mg total) by mouth 2 (two) times daily. 60 tablet 5  . oxybutynin (  DITROPAN) 5 MG tablet Take 1 tablet (5 mg total) by mouth 2 (two) times daily. 60 tablet 3  . sertraline (ZOLOFT) 100 MG tablet Take 1 tablet (100 mg total) by mouth daily. (Patient not taking: Reported on 11/16/2016) 30 tablet 5  . sulfamethoxazole-trimethoprim (BACTRIM DS) 800-160 MG tablet Take 1 tablet by mouth 2 (two) times daily. (Patient not taking: Reported on 11/16/2016) 14 tablet 0   No facility-administered medications prior to visit.     ROS Review of Systems  Constitutional: Negative for activity change, appetite  change and fatigue.  HENT: Negative for congestion, sinus pressure and sore throat.   Eyes: Negative for visual disturbance.  Respiratory: Negative for cough, chest tightness, shortness of breath and wheezing.   Cardiovascular: Negative for chest pain and palpitations.  Gastrointestinal: Negative for abdominal distention, abdominal pain and constipation.  Endocrine: Negative for polydipsia.  Genitourinary: Negative for dysuria and frequency.  Musculoskeletal: Negative for arthralgias and back pain.  Skin: Negative for rash.  Neurological: Negative for tremors, light-headedness and numbness.  Hematological: Does not bruise/bleed easily.  Psychiatric/Behavioral: Positive for dysphoric mood. Negative for agitation and behavioral problems.    Objective:  BP (!) 145/85   Pulse 88   Temp 98.1 F (36.7 C) (Oral)   Ht 5\' 4"  (1.626 m)   Wt 195 lb (88.5 kg)   SpO2 98%   BMI 33.47 kg/m   BP/Weight 11/16/2016 09/28/2016 08/24/2016  Systolic BP 145 142 145  Diastolic BP 85 87 86  Wt. (Lbs) 195 195 192  BMI 33.47 33.47 32.96      Physical Exam Constitutional: She is oriented to person, place, and time. She appears well-developed and well-nourished. No distress.  HENT:  Head: Normocephalic.  Right Ear: External ear normal.  Left Ear: External ear normal.  Nose: Nose normal.  Mouth/Throat: Oropharynx is clear and moist.  Eyes: Pupils are equal, round, and reactive to light. Conjunctivae and EOM are normal.  Neck: Normal range of motion. No JVD present.  Cardiovascular: Normal rate, regular rhythm, normal heart sounds and intact distal pulses.  Exam reveals no gallop.   No murmur heard. Pulmonary/Chest: Effort normal and breath sounds normal. No respiratory distress. She has no wheezes. She has no rales. She exhibits no tenderness. Right breast exhibits no mass and no tenderness. Left breast exhibits no mass and no tenderness.  Abdominal: Soft. Bowel sounds are normal. She exhibits no  distension and no mass. There is no tenderness.  Musculoskeletal: Normal range of motion. She exhibits no edema or tenderness.  L foot transmetatarsal amputation  Neurological: She is alert and oriented to person, place, and time. She has normal reflexes.  Skin: Skin is warm and dry. She is not diaphoretic.  Psychiatric: She has a dysphoric mood   Lab Results  Component Value Date   HGBA1C 10.1 08/24/2016    Assessment & Plan:   1. Type 2 diabetes mellitus with retinopathy without macular edema, with long-term current use of insulin, unspecified laterality, unspecified retinopathy severity (HCC) Uncontrolled with A1c of 10.1 CBG of 359 ; 20 units of NovoLog administered and patient observed for 45 minutes and repeat blood sugar is 301 Increase Humulin 70/30 to 45 units twice daily Encouraged to up titrate by 2 units every fourth day until blood sugars are at goal Keep blood sugar logs with fasting goals of 80-120 mg/dl, random of less than 161 and in the event of sugars less than 60 mg/dl or greater than 096 mg/dl please notify the  clinic ASAP. It is recommended that you undergo annual eye exams and annual foot exams. Pneumovax is recommended every 5 years before the age of 69 and once for a lifetime at or after the age of 73.  - POCT glucose (manual entry) - insulin aspart (novoLOG) injection 20 Units; Inject 0.2 mLs (20 Units total) into the skin once.  2. Stress incontinence of urine Controlled - oxybutynin (DITROPAN) 5 MG tablet; Take 1 tablet (5 mg total) by mouth 2 (two) times daily.  Dispense: 60 tablet; Refill: 3  3. Essential hypertension Elevated above goal of less than 130/80 Blood pressure remains elevated next visit, I will increase her dose Low-sodium, DASH diet - metoprolol tartrate (LOPRESSOR) 25 MG tablet; Take 1 tablet (25 mg total) by mouth 2 (two) times daily.  Dispense: 60 tablet; Refill: 5  4. Other insomnia Controlled - amitriptyline (ELAVIL) 100 MG  tablet; Take 1 tablet (100 mg total) by mouth at bedtime.  Dispense: 30 tablet; Refill: 5  5. Depression, major, single episode, mild (HCC) Uncontrolled due to underlying family stressors She has not been taking Zoloft I have switched to Cymbalta as this will help with pain as well Seen in conjunction with LCSW-psychotherapy performed.  6. Other diabetic neurological complication associated with type 2 diabetes mellitus (HCC) Uncontrolled Hopefully addition of Cymbalta should help symptoms If persisting at next visit will consider switching to Lyrica - gabapentin (NEURONTIN) 600 MG tablet; Take 1 tablet (600 mg total) by mouth 3 (three) times daily.  Dispense: 90 tablet; Refill: 5   Meds ordered this encounter  Medications  . insulin aspart (novoLOG) injection 20 Units  . oxybutynin (DITROPAN) 5 MG tablet    Sig: Take 1 tablet (5 mg total) by mouth 2 (two) times daily.    Dispense:  60 tablet    Refill:  3  . atorvastatin (LIPITOR) 40 MG tablet    Sig: Take 1 tablet (40 mg total) by mouth daily.    Dispense:  30 tablet    Refill:  3  . metoprolol tartrate (LOPRESSOR) 25 MG tablet    Sig: Take 1 tablet (25 mg total) by mouth 2 (two) times daily.    Dispense:  60 tablet    Refill:  5  . lisinopril (PRINIVIL,ZESTRIL) 20 MG tablet    Sig: Take 1 tablet (20 mg total) by mouth daily.    Dispense:  30 tablet    Refill:  5    Discontinue previous dose  . amitriptyline (ELAVIL) 100 MG tablet    Sig: Take 1 tablet (100 mg total) by mouth at bedtime.    Dispense:  30 tablet    Refill:  5  . gabapentin (NEURONTIN) 600 MG tablet    Sig: Take 1 tablet (600 mg total) by mouth 3 (three) times daily.    Dispense:  90 tablet    Refill:  5  . insulin NPH-regular Human (HUMULIN 70/30) (70-30) 100 UNIT/ML injection    Sig: Inject 45 units subcutaneously twice daily before breakfast and dinner    Dispense:  90 mL    Refill:  3    Discontinue previous dose  . DULoxetine (CYMBALTA) 60 MG  capsule    Sig: Take 1 capsule (60 mg total) by mouth daily.    Dispense:  30 capsule    Refill:  5    Discontinue Zoloft    Follow-up: Return in about 3 weeks (around 12/07/2016) for Follow-up on  diabetes mellitus.   Jaclyn Shaggy MD

## 2016-11-17 ENCOUNTER — Encounter: Payer: Self-pay | Admitting: Family Medicine

## 2016-11-19 MED FILL — LISINOPRIL 20 MG TAB: 20 | 30 days supply | Qty: 30 | Fill #2

## 2016-11-19 MED FILL — ?METFORMIN HCL 500MG TABLET: 500 | 30 days supply | Qty: 60 | Fill #2

## 2016-11-19 MED FILL — AMITRIPTYLINE HCL 100 MG TA: 100 | 30 days supply | Qty: 30 | Fill #3

## 2016-11-24 ENCOUNTER — Other Ambulatory Visit: Payer: Self-pay | Admitting: Family Medicine

## 2016-11-24 MED FILL — ?ATORVASTATIN 40MG TABLET: 40 | 30 days supply | Qty: 30 | Fill #3

## 2016-11-24 MED FILL — ?METOPROLOL 25 MG TABLET: 25 | 30 days supply | Qty: 60 | Fill #2

## 2016-11-25 MED FILL — GABAPENTIN 600 MG TABLET: 600 | 30 days supply | Qty: 90 | Fill #0

## 2016-12-09 ENCOUNTER — Ambulatory Visit: Payer: Self-pay | Attending: Family Medicine | Admitting: Family Medicine

## 2016-12-09 ENCOUNTER — Encounter: Payer: Self-pay | Admitting: Family Medicine

## 2016-12-09 VITALS — BP 167/91 | HR 96 | Temp 98.0°F | Ht 64.0 in | Wt 195.2 lb

## 2016-12-09 DIAGNOSIS — J219 Acute bronchiolitis, unspecified: Secondary | ICD-10-CM | POA: Insufficient documentation

## 2016-12-09 DIAGNOSIS — J218 Acute bronchiolitis due to other specified organisms: Secondary | ICD-10-CM

## 2016-12-09 DIAGNOSIS — E1140 Type 2 diabetes mellitus with diabetic neuropathy, unspecified: Secondary | ICD-10-CM | POA: Insufficient documentation

## 2016-12-09 DIAGNOSIS — Z794 Long term (current) use of insulin: Secondary | ICD-10-CM | POA: Insufficient documentation

## 2016-12-09 DIAGNOSIS — F329 Major depressive disorder, single episode, unspecified: Secondary | ICD-10-CM | POA: Insufficient documentation

## 2016-12-09 DIAGNOSIS — G4700 Insomnia, unspecified: Secondary | ICD-10-CM | POA: Insufficient documentation

## 2016-12-09 DIAGNOSIS — I10 Essential (primary) hypertension: Secondary | ICD-10-CM | POA: Insufficient documentation

## 2016-12-09 DIAGNOSIS — Z7982 Long term (current) use of aspirin: Secondary | ICD-10-CM | POA: Insufficient documentation

## 2016-12-09 DIAGNOSIS — Z79899 Other long term (current) drug therapy: Secondary | ICD-10-CM | POA: Insufficient documentation

## 2016-12-09 DIAGNOSIS — E11319 Type 2 diabetes mellitus with unspecified diabetic retinopathy without macular edema: Secondary | ICD-10-CM | POA: Insufficient documentation

## 2016-12-09 LAB — GLUCOSE, POCT (MANUAL RESULT ENTRY): POC GLUCOSE: 194 mg/dL — AB (ref 70–99)

## 2016-12-09 LAB — POCT GLYCOSYLATED HEMOGLOBIN (HGB A1C): Hemoglobin A1C: 11.8

## 2016-12-09 MED ORDER — CETIRIZINE HCL 10 MG PO TABS: 10.0000 mg | ORAL_TABLET | Freq: Every day | ORAL | 1 refills | Status: DC

## 2016-12-09 MED ORDER — INSULIN NPH ISOPHANE & REGULAR (70-30) 100 UNIT/ML ~~LOC~~ SUSP: SUBCUTANEOUS | 3 refills | Status: DC

## 2016-12-09 MED ORDER — GUAIFENESIN ER 600 MG PO TB12: 600.0000 mg | ORAL_TABLET | Freq: Two times a day (BID) | ORAL | 0 refills | Status: DC

## 2016-12-09 MED ORDER — METOPROLOL TARTRATE 50 MG PO TABS: 50.0000 mg | ORAL_TABLET | Freq: Two times a day (BID) | ORAL | 3 refills | Status: DC

## 2016-12-09 MED FILL — ?METOPROLOL 50 MG TABLET: 50 | 30 days supply | Qty: 60 | Fill #0

## 2016-12-09 MED FILL — ?CETIRIZINE HCL 10 MG TABLE: 10 | 30 days supply | Qty: 30 | Fill #0

## 2016-12-09 NOTE — Patient Instructions (Signed)

## 2016-12-09 NOTE — Progress Notes (Signed)
Subjective:  Patient ID: Anna Wagner, female    DOB: Aug 25, 1960  Age: 56 y.o. MRN: [id number]  CC: Diabetes   [name]  is a 56 year old female with a history of type 2 diabetes mellitus (A1c 11.8), diabetic neuropathy, hypertension, insomnia, depression, transmetatarsal amputation of the left foot who presents today for follow-up for diabetes mellitus.  At her last visit her Humulin 70/30 had been increased to 45 units twice daily and she was educated on uptitrating her dose until blood sugars were at goal however she has not been doing that and has remained on 45 units twice daily. Her fasting blood sugars are still in the 200s and she denies hypoglycemia.  Today she complains of a two-week history of cough which is sometimes productive of off-white sputum, nasal congestion which has not responded to the use of steroids and she is currently taking Robitussin. She denies tenderness tenderness, fever, ear ache or myalgia as an has no shortness of breath or wheezing. Denies history of sick contacts.   Past Medical History:  Diagnosis Date  . Bell's palsy 2005  . Diabetes mellitus without complication (HCC)   . Hypertension   . Neuromuscular disorder (HCC)    NEUROPATHY  . Sepsis (HCC) 02/11/2016    Past Surgical History:  Procedure Laterality Date  . AMPUTATION Left 03/15/2014   Procedure: AMPUTATION TRANSMETATARSAL;  Surgeon: Nadara Mustard, MD;  Location: [facility] OR;  Service: Orthopedics;  Laterality: Left;  . CESAREAN SECTION    . CHOLECYSTECTOMY      No Known Allergies   Outpatient Medications Prior to Visit  Medication Sig Dispense Refill  . acetaminophen (TYLENOL) 500 MG tablet Take 1,000 mg by mouth every 6 (six) hours as needed for fever.    Marland Kitchen amitriptyline (ELAVIL) 100 MG tablet Take 1 tablet (100 mg total) by mouth at bedtime. 30 tablet 5  . aspirin EC 81 MG tablet Take 81 mg by mouth at bedtime.     Marland Kitchen atorvastatin (LIPITOR) 40 MG tablet Take 1 tablet (40 mg  total) by mouth daily. 30 tablet 3  . Cyanocobalamin (VITAMIN B-12 PO) Take 1 tablet by mouth 2 (two) times daily.    . DULoxetine (CYMBALTA) 60 MG capsule Take 1 capsule (60 mg total) by mouth daily. 30 capsule 5  . gabapentin (NEURONTIN) 600 MG tablet Take 1 tablet (600 mg total) by mouth 3 (three) times daily. 90 tablet 5  . INS SYRINGE/NEEDLE .5CC/27G 27G X 1/2" 0.5 ML MISC Use as directed 10 each 12  . lisinopril (PRINIVIL,ZESTRIL) 20 MG tablet Take 1 tablet (20 mg total) by mouth daily. 30 tablet 5  . metFORMIN (GLUCOPHAGE) 500 MG tablet TAKE 1 TABLET BY MOUTH TWICE DAILY WITH A MEAL 60 tablet 5  . oxybutynin (DITROPAN) 5 MG tablet Take 1 tablet (5 mg total) by mouth 2 (two) times daily. 60 tablet 3  . insulin NPH-regular Human (HUMULIN 70/30) (70-30) 100 UNIT/ML injection Inject 45 units subcutaneously twice daily before breakfast and dinner 90 mL 3  . metoprolol tartrate (LOPRESSOR) 25 MG tablet Take 1 tablet (25 mg total) by mouth 2 (two) times daily. 60 tablet 5   No facility-administered medications prior to visit.     ROS Review of Systems  Constitutional: Negative for activity change, appetite change and fatigue.  HENT: Positive for congestion. Negative for sinus pressure and sore throat.   Eyes: Negative for visual disturbance.  Respiratory: Positive for cough. Negative for chest tightness, shortness of  breath and wheezing.   Cardiovascular: Negative for chest pain and palpitations.  Gastrointestinal: Negative for abdominal distention, abdominal pain and constipation.  Endocrine: Negative for polydipsia.  Genitourinary: Negative for dysuria and frequency.  Musculoskeletal: Negative for arthralgias and back pain.  Skin: Negative for rash.  Neurological: Negative for tremors, light-headedness and numbness.  Hematological: Does not bruise/bleed easily.  Psychiatric/Behavioral: Negative for agitation and behavioral problems.    Objective:  BP (!) 167/91   Pulse 96   Temp  98 F (36.7 C) (Oral)   Ht  (1.626 m)   Wt 195 lb 3.2 oz (88.5 kg)   SpO2 95%   BMI 33.51 kg/m   BP/Weight 12/09/2016 11/16/2016 09/28/2016  Systolic BP 167 145 142  Diastolic BP 91 85 87  Wt. (Lbs) 195.2 195 195  BMI 33.51 33.47 33.47      Physical Exam  Constitutional: She is oriented to person, place, and time. She appears well-developed and well-nourished.  HENT:  Hoarse voice  Cardiovascular: Normal rate, normal heart sounds and intact distal pulses.   No murmur heard. Pulmonary/Chest: Effort normal and breath sounds normal. She has no wheezes. She has no rales. She exhibits no tenderness.  Abdominal: Soft. Bowel sounds are normal. She exhibits no distension and no mass. There is no tenderness.  Musculoskeletal: Normal range of motion.  Left foot transmetatarsal amputation  Neurological: She is alert and oriented to person, place, and time.  Psychiatric: She has a normal mood and affect.     Lab Results  Component Value Date   HGBA1C 11.8 12/09/2016    Assessment & Plan:   1. Type 2 diabetes mellitus with retinopathy without macular edema, with long-term current use of insulin, unspecified laterality, unspecified retinopathy severity (HCC) Uncontrolled with A1c of 11.8 Increase Humulin 70/30 to 50 units twice daily Emphasized the need to comply with a diabetic diet and lifestyle modifications Keep blood sugar logs with fasting goals of 80-120 mg/dl, random of less than 161 and in the event of sugars less than 60 mg/dl or greater than 096 mg/dl please notify the clinic ASAP. It is recommended that you undergo annual eye exams and annual foot exams. Pneumovax is recommended every 5 years before the age of 63 and once for a lifetime at or after the age of 7. - POCT glucose (manual entry) - POCT glycosylated hemoglobin (Hb A1C)  2. Essential hypertension Uncontrolled Increased dose of metoprolol Low-sodium, DASH diet - metoprolol tartrate (LOPRESSOR) 50 MG  tablet; Take 1 tablet (50 mg total) by mouth 2 (two) times daily.  Dispense: 60 tablet; Refill: 3  3. Acute bronchiolitis due to other specified organisms Likely viral Placed on Mucinex and Zyrtec No indication for antibiotics at this time Patient to call the clinic in the event of symptoms worsening   Meds ordered this encounter  Medications  . insulin NPH-regular Human (HUMULIN 70/30) (70-30) 100 UNIT/ML injection    Sig: Inject 50 units subcutaneously twice daily before breakfast and dinner    Dispense:  90 mL    Refill:  3    Discontinue previous dose  . metoprolol tartrate (LOPRESSOR) 50 MG tablet    Sig: Take 1 tablet (50 mg total) by mouth 2 (two) times daily.    Dispense:  60 tablet    Refill:  3    Discontinue previous dose  . guaiFENesin (MUCINEX) 600 MG 12 hr tablet    Sig: Take 1 tablet (600 mg total) by mouth 2 (two) times daily.  Dispense:  20 tablet    Refill:  0  . cetirizine (ZYRTEC) 10 MG tablet    Sig: Take 1 tablet (10 mg total) by mouth daily.    Dispense:  30 tablet    Refill:  1    Follow-up: Return in about 3 months (around 03/10/2017) for Follow-up on diabetes mellitus.   Jaclyn Shaggy MD

## 2016-12-13 MED FILL — DULoxetine HCL 60 MG CPEP: 60 | 30 days supply | Qty: 30 | Fill #1

## 2016-12-13 MED FILL — OXYBUTYNIN 5 MG TABLET: 5 | 30 days supply | Qty: 60 | Fill #2

## 2016-12-21 MED FILL — $Humulin 70/30 vial: (70-30) 100 | 30 days supply | Qty: 30 | Fill #0

## 2016-12-21 MED FILL — AMITRIPTYLINE HCL 100 MG TA: 100 | 30 days supply | Qty: 30 | Fill #4

## 2016-12-23 MED FILL — GABAPENTIN 600 MG TABS: 600 | 30 days supply | Qty: 90 | Fill #1

## 2016-12-28 MED FILL — LISINOPRIL 20 MG TAB: 20 | 30 days supply | Qty: 30 | Fill #3

## 2016-12-28 MED FILL — ?METFORMIN HCL 500MG TABLET: 500 | 30 days supply | Qty: 60 | Fill #3

## 2016-12-29 ENCOUNTER — Emergency Department (HOSPITAL_COMMUNITY): Payer: Self-pay

## 2016-12-29 ENCOUNTER — Encounter (HOSPITAL_COMMUNITY): Payer: Self-pay | Admitting: *Deleted

## 2016-12-29 ENCOUNTER — Emergency Department (HOSPITAL_COMMUNITY)
Admission: EM | Admit: 2016-12-29 | Discharge: 2016-12-29 | Disposition: A | Discharge status: Home / Self Care | Payer: Self-pay | Attending: Emergency Medicine | Admitting: Emergency Medicine

## 2016-12-29 DIAGNOSIS — I10 Essential (primary) hypertension: Secondary | ICD-10-CM | POA: Insufficient documentation

## 2016-12-29 DIAGNOSIS — Z89422 Acquired absence of other left toe(s): Secondary | ICD-10-CM | POA: Insufficient documentation

## 2016-12-29 DIAGNOSIS — Z794 Long term (current) use of insulin: Secondary | ICD-10-CM | POA: Insufficient documentation

## 2016-12-29 DIAGNOSIS — Z87891 Personal history of nicotine dependence: Secondary | ICD-10-CM | POA: Insufficient documentation

## 2016-12-29 DIAGNOSIS — Z7982 Long term (current) use of aspirin: Secondary | ICD-10-CM | POA: Insufficient documentation

## 2016-12-29 DIAGNOSIS — Z79899 Other long term (current) drug therapy: Secondary | ICD-10-CM | POA: Insufficient documentation

## 2016-12-29 DIAGNOSIS — M25561 Pain in right knee: Secondary | ICD-10-CM | POA: Insufficient documentation

## 2016-12-29 DIAGNOSIS — M25572 Pain in left ankle and joints of left foot: Secondary | ICD-10-CM

## 2016-12-29 DIAGNOSIS — W19XXXA Unspecified fall, initial encounter: Secondary | ICD-10-CM

## 2016-12-29 DIAGNOSIS — E1140 Type 2 diabetes mellitus with diabetic neuropathy, unspecified: Secondary | ICD-10-CM | POA: Insufficient documentation

## 2016-12-29 HISTORY — DX: Polyneuropathy, unspecified: G62.9

## 2016-12-29 MED ORDER — ACETAMINOPHEN 325 MG PO TABS
650.0000 mg | ORAL_TABLET | Freq: Once | ORAL | Status: AC
  Administered 2016-12-29: 650 mg via ORAL
  Filled 2016-12-29: qty 2

## 2016-12-29 NOTE — ED Provider Notes (Signed)
[facility] EMERGENCY DEPARTMENT Provider Note   CSN: [id number] Arrival date & time: 12/29/16  1528     History   Chief Complaint Chief Complaint  Patient presents with  . Fall  . Leg Pain    [name] is a 56 y.o. female.  HPI  56 year old female presents status post fall.  Patient reports she was at [facility] when she tripped and fell landing forward.  She notes she was unable to get to her feet on her own as she has a history of distal neuropathy, amputation of her toes and generalized lower extremity weakness and deconditioning.  Patient notes she was able to get back to her feet without significant pain or difficulty with the assistant of bystanders.  She was ambulatory, was able to drive home.  She notes walking into the house when her right leg gave out causing her to fall.  She landed on her left anterior knee.  She notes she has very little sensation in the distal extremities, notes a burning sensation over the left ankle, no swelling or edema to either the ankle or the knee.  She also notes pain in her right groin and lateral hip.  She denies any loss of consciousness, denies any headache presently, reports pain in her right shoulder, slightly worse with range of motion.  No medications prior to arrival.   Past Medical History:  Diagnosis Date  . Bell's palsy 2005  . Diabetes mellitus without complication (HCC)   . Hypertension   . Neuromuscular disorder (HCC)    NEUROPATHY  . Neuropathy   . Sepsis (HCC) 02/11/2016    Patient Active Problem List   Diagnosis Date Noted  . Sepsis (HCC) 02/10/2016  . Diabetic ketoacidosis without coma associated with type 2 diabetes mellitus (HCC)   . Lactic acidosis   . Urinary tract infection without hematuria   . Essential hypertension   . 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 Hypokalemia 03/15/2014  . Foot abscess, left 03/15/2014  . Wound infection 03/14/2014  . Diabetic foot  ulcer (HCC) 03/14/2014  . DM type 2 (diabetes mellitus, type 2) (HCC) 03/14/2014  . Cellulitis of left foot 03/14/2014  . Bell's palsy   . VAGINITIS NOS 09/30/2006  . Depression, major, single episode, mild (HCC) 08/05/2006  . Diabetic neuropathy (HCC) 08/05/2006  . HYPERTENSION 08/05/2006  . Insomnia 08/05/2006  . BELL'S PALSY, HX OF 08/05/2006  . HYSTERECTOMY, HX OF 08/05/2006  . CHOLECYSTECTOMY, HX OF 08/05/2006    Past Surgical History:  Procedure Laterality Date  . AMPUTATION Left 03/15/2014   Procedure: AMPUTATION TRANSMETATARSAL;  Surgeon: Nadara Mustard, MD;  Location: [facility] OR;  Service: Orthopedics;  Laterality: Left;  . CESAREAN SECTION    . CHOLECYSTECTOMY      OB History    No data available       Home Medications    Prior to Admission medications   Medication Sig Start Date End Date Taking? Authorizing Provider  acetaminophen (TYLENOL) 500 MG tablet Take 1,000 mg by mouth every 6 (six) hours as needed for fever.    [provider]  amitriptyline (ELAVIL) 100 MG tablet Take 1 tablet (100 mg total) by mouth at bedtime. 11/16/16   Jaclyn Shaggy, MD  aspirin EC 81 MG tablet Take 81 mg by mouth at bedtime.     [provider]  atorvastatin (LIPITOR) 40 MG tablet Take 1 tablet (40 mg total) by mouth daily. 11/16/16  Jaclyn Shaggy, MD  cetirizine (ZYRTEC) 10 MG tablet Take 1 tablet (10 mg total) by mouth daily. 12/09/16   Jaclyn Shaggy, MD  Cyanocobalamin (VITAMIN B-12 PO) Take 1 tablet by mouth 2 (two) times daily.    [provider]  DULoxetine (CYMBALTA) 60 MG capsule Take 1 capsule (60 mg total) by mouth daily. 11/16/16   Jaclyn Shaggy, MD  gabapentin (NEURONTIN) 600 MG tablet Take 1 tablet (600 mg total) by mouth 3 (three) times daily. 11/16/16   Jaclyn Shaggy, MD  guaiFENesin (MUCINEX) 600 MG 12 hr tablet Take 1 tablet (600 mg total) by mouth 2 (two) times daily. 12/09/16   Jaclyn Shaggy, MD  INS SYRINGE/NEEDLE .5CC/27G 27G X 1/2" 0.5 ML MISC Use as  directed 10/29/16   Jaclyn Shaggy, MD  insulin NPH-regular Human (HUMULIN 70/30) (70-30) 100 UNIT/ML injection Inject 50 units subcutaneously twice daily before breakfast and dinner 12/09/16   Jaclyn Shaggy, MD  lisinopril (PRINIVIL,ZESTRIL) 20 MG tablet Take 1 tablet (20 mg total) by mouth daily. 11/16/16   Jaclyn Shaggy, MD  metFORMIN (GLUCOPHAGE) 500 MG tablet TAKE 1 TABLET BY MOUTH TWICE DAILY WITH A MEAL 08/24/16   Jaclyn Shaggy, MD  metoprolol tartrate (LOPRESSOR) 50 MG tablet Take 1 tablet (50 mg total) by mouth 2 (two) times daily. 12/09/16   Jaclyn Shaggy, MD  oxybutynin (DITROPAN) 5 MG tablet Take 1 tablet (5 mg total) by mouth 2 (two) times daily. 11/16/16   Jaclyn Shaggy, MD    Family History Family History  Problem Relation Age of Onset  . Heart attack Mother   . Diabetes Father   . Diabetes Sister     Social History Social History  Substance Use Topics  . Smoking status: Former Smoker    Packs/day: 1.00    Years: 38.00    Types: Cigarettes    Quit date: 09/19/2013  . Smokeless tobacco: Never Used  . Alcohol use No     Allergies   Patient has no known allergies.   Review of Systems Review of Systems  All other systems reviewed and are negative.    Physical Exam Updated Vital Signs BP (!) 148/82 (BP Location: Right Arm)   Pulse 84   Temp 98.1 F (36.7 C) (Oral)   Resp 16   SpO2 100%   Physical Exam  Constitutional: She is oriented to person, place, and time. She appears well-developed and well-nourished.  HENT:  Head: Normocephalic and atraumatic.  Eyes: Pupils are equal, round, and reactive to light. Conjunctivae are normal. Right eye exhibits no discharge. Left eye exhibits no discharge. No scleral icterus.  Neck: Normal range of motion. No JVD present. No tracheal deviation present.  Pulmonary/Chest: Effort normal. No stridor.  Musculoskeletal:  Left ankle atraumatic, no tenderness to palpation of the ankle or foot, no sensation (baseline for  patient)  Right knee with a superficial abrasion tenderness along the anterior aspect no significant laxity or deformities, no swelling or edema  Right lateral hip with minor tenderness palpation, full active range of motion of the hip without significant pain or difficulty, as are stable with AP and lateral compression  Right shoulder atraumatic minor tenderness with range of motion, no crepitus, no weakness, no obvious signs of trauma, no focal tenderness palpation  Neurological: She is alert and oriented to person, place, and time. Coordination normal.  Psychiatric: She has a normal mood and affect. Her behavior is normal. Judgment and thought content normal.  Nursing note and vitals reviewed.  ED Treatments / Results  Labs (all labs ordered are listed, but only abnormal results are displayed) Labs Reviewed - No data to display  EKG  EKG Interpretation None       Radiology Dg Ankle Complete Left  Result Date: 12/29/2016 CLINICAL DATA:  Fall EXAM: LEFT ANKLE COMPLETE - 3+ VIEW COMPARISON:  03/14/2014 FINDINGS: Negative for fracture. Ossicle adjacent to the medial malleolus is chronic. Calcaneal spurring.  Transmetatarsal amputation. IMPRESSION: Negative for fracture. Electronically Signed   By: Marlan Palauharles  Clark M.D.   On: 12/29/2016 17:32   Dg Knee Complete 4 Views Left  Result Date: 12/29/2016 CLINICAL DATA:  Fall EXAM: LEFT KNEE - COMPLETE 4+ VIEW COMPARISON:  None. FINDINGS: No evidence of fracture, dislocation, or joint effusion. No evidence of arthropathy or other focal bone abnormality. Soft tissues are unremarkable. IMPRESSION: Negative. Electronically Signed   By: Marlan Palauharles  Clark M.D.   On: 12/29/2016 17:33   Dg Hip Unilat W Or Wo Pelvis 2-3 Views Right  Result Date: 12/29/2016 CLINICAL DATA:  Fall EXAM: DG HIP (WITH OR WITHOUT PELVIS) 2-3V RIGHT COMPARISON:  None. FINDINGS: There is no evidence of hip fracture or dislocation. There is no evidence of arthropathy or  other focal bone abnormality. IMPRESSION: Negative. Electronically Signed   By: Marlan Palauharles  Clark M.D.   On: 12/29/2016 17:32    Procedures Procedures (including critical care time)  Medications Ordered in ED Medications  acetaminophen (TYLENOL) tablet 650 mg (650 mg Oral Given 12/29/16 1621)     Initial Impression / Assessment and Plan / ED Course  I have reviewed the triage vital signs and the nursing notes.  Pertinent labs & imaging results that were available during my care of the patient were reviewed by me and considered in my medical decision making (see chart for details).     Final Clinical Impressions(s) / ED Diagnoses   Final diagnoses:  Fall, initial encounter  Acute left ankle pain  Acute pain of right knee   Labs:   Imaging: DG ankle complete left, DG hip unilateral right, DG knee complete left  Consults:  Therapeutics: Tylenol  Discharge Meds:   Assessment/Plan: 56 year old female status post fall.  Patient has a history of neuropathy and lower extremity compensation.  Patient had a mechanical fall today.  She has very minor signs of trauma, no signs of acute fracture on exam.  She is able to stand and ambulate without significant difficulty here today.  She will be instructed to use Tylenol as needed for discomfort, follow-up with primary care if symptoms persist return to emergency room if they worsen.  She verbalized understanding and agreement to today's plan.    New Prescriptions Discharge Medication List as of 12/29/2016  6:20 PM       Rosalio LoudHedges, Adda Stokes, PA-C 12/29/16 Florian Buff2000    Lockwood, Robert, MD 12/30/16 1212

## 2016-12-29 NOTE — Discharge Instructions (Signed)
Please read attached information. If you experience any new or worsening signs or symptoms please return to the emergency room for evaluation. Please follow-up with your primary care provider or specialist as discussed.  °

## 2016-12-29 NOTE — ED Notes (Signed)
Declined W/C at D/C and was escorted to lobby by RN. 

## 2016-12-29 NOTE — ED Triage Notes (Signed)
Pt has multiple complaints. Reports having neuropathy and frequent falls. Fells today and now has pain to left ankle, lower leg, left knee, her shoulders and also her head. Denies hitting her head when she fell.

## 2016-12-29 NOTE — ED Triage Notes (Signed)
Pt reports a new burning sensation to the Lt ankle . Pt reports she does not have feeling in  The Lt ankle.  Pt also has an abrasion to Lt knee. Pt says the Knee also burns.

## 2017-01-06 MED FILL — ?ATORVASTATIN 40MG TABLET: 40 | 30 days supply | Qty: 30 | Fill #0

## 2017-01-17 MED FILL — $Humulin 70/30 vial: (70-30) 100 | 30 days supply | Qty: 30 | Fill #1

## 2017-01-18 MED FILL — ?DULOXETINE HCL DR 60 MG CA: 60 MG | 30 days supply | Qty: 30 | Fill #2

## 2017-01-18 MED FILL — AMITRIPTYLINE HCL 100 MG TA: 100 | 30 days supply | Qty: 30 | Fill #5

## 2017-01-18 MED FILL — ?CETIRIZINE HCL 10 MG TABLE: 10 | 30 days supply | Qty: 30 | Fill #1

## 2017-01-26 MED FILL — GABAPENTIN 600 MG TABS: 600 | 30 days supply | Qty: 90 | Fill #2

## 2017-01-31 MED FILL — ?METOPROLOL 50 MG TABLET: 50 | 30 days supply | Qty: 60 | Fill #1

## 2017-02-01 MED FILL — ?METFORMIN HCL 500MG TABLET: 500 | 30 days supply | Qty: 60 | Fill #4

## 2017-02-01 MED FILL — LISINOPRIL 20 MG TABLET: 20 | 30 days supply | Qty: 30 | Fill #4

## 2017-02-16 MED FILL — AMITRIPTYLINE HCL 100 MG TA: 100 | 30 days supply | Qty: 30 | Fill #0

## 2017-02-16 MED FILL — $Humulin 70/30 vial: (70-30) 100 | 30 days supply | Qty: 30 | Fill #2

## 2017-02-16 MED FILL — TRUEPLUS SYR 0.5ML 30GX5/16: 30G X 5/16" | 30 days supply | Qty: 100 | Fill #1

## 2017-02-16 MED FILL — ?DULOXETINE HCL 60 MG CPEP: 60 | 30 days supply | Qty: 30 | Fill #3

## 2017-02-21 MED FILL — GABAPENTIN 600 MG TABLET: 600 | 30 days supply | Qty: 90 | Fill #3

## 2017-02-25 ENCOUNTER — Encounter (HOSPITAL_COMMUNITY): Payer: Self-pay | Admitting: Emergency Medicine

## 2017-02-25 ENCOUNTER — Ambulatory Visit (HOSPITAL_COMMUNITY)
Admission: EM | Admit: 2017-02-25 | Discharge: 2017-02-25 | Disposition: A | Discharge status: Home / Self Care | Payer: Self-pay | Attending: Internal Medicine | Admitting: Internal Medicine

## 2017-02-25 ENCOUNTER — Other Ambulatory Visit: Payer: Self-pay

## 2017-02-25 DIAGNOSIS — N3000 Acute cystitis without hematuria: Secondary | ICD-10-CM

## 2017-02-25 DIAGNOSIS — Z794 Long term (current) use of insulin: Secondary | ICD-10-CM

## 2017-02-25 DIAGNOSIS — Z87891 Personal history of nicotine dependence: Secondary | ICD-10-CM | POA: Insufficient documentation

## 2017-02-25 DIAGNOSIS — E1142 Type 2 diabetes mellitus with diabetic polyneuropathy: Secondary | ICD-10-CM

## 2017-02-25 DIAGNOSIS — I10 Essential (primary) hypertension: Secondary | ICD-10-CM

## 2017-02-25 DIAGNOSIS — Z7982 Long term (current) use of aspirin: Secondary | ICD-10-CM | POA: Insufficient documentation

## 2017-02-25 DIAGNOSIS — R300 Dysuria: Secondary | ICD-10-CM

## 2017-02-25 DIAGNOSIS — E1340 Other specified diabetes mellitus with diabetic neuropathy, unspecified: Secondary | ICD-10-CM | POA: Insufficient documentation

## 2017-02-25 DIAGNOSIS — Z79899 Other long term (current) drug therapy: Secondary | ICD-10-CM | POA: Insufficient documentation

## 2017-02-25 DIAGNOSIS — E876 Hypokalemia: Secondary | ICD-10-CM | POA: Insufficient documentation

## 2017-02-25 DIAGNOSIS — M545 Low back pain, unspecified: Secondary | ICD-10-CM

## 2017-02-25 LAB — POCT I-STAT, CHEM 8
BUN: 12 mg/dL (ref 6–20)
CALCIUM ION: 1.15 mmol/L (ref 1.15–1.40)
CREATININE: 0.5 mg/dL (ref 0.44–1.00)
Chloride: 103 mmol/L (ref 101–111)
GLUCOSE: 168 mg/dL — AB (ref 65–99)
HEMATOCRIT: 39 % (ref 36.0–46.0)
HEMOGLOBIN: 13.3 g/dL (ref 12.0–15.0)
Potassium: 4 mmol/L (ref 3.5–5.1)
Sodium: 138 mmol/L (ref 135–145)
TCO2: 24 mmol/L (ref 22–32)

## 2017-02-25 LAB — POCT URINALYSIS DIP (DEVICE)
Bilirubin Urine: NEGATIVE
GLUCOSE, UA: NEGATIVE mg/dL
Hgb urine dipstick: NEGATIVE
Ketones, ur: NEGATIVE mg/dL
LEUKOCYTES UA: NEGATIVE
Nitrite: POSITIVE — AB
PROTEIN: 100 mg/dL — AB
SPECIFIC GRAVITY, URINE: 1.02 (ref 1.005–1.030)
UROBILINOGEN UA: 0.2 mg/dL (ref 0.0–1.0)
pH: 6 (ref 5.0–8.0)

## 2017-02-25 MED ORDER — NITROFURANTOIN MONOHYD MACRO 100 MG PO CAPS: 100.0000 mg | ORAL_CAPSULE | Freq: Two times a day (BID) | ORAL | 0 refills | Status: AC

## 2017-02-25 MED FILL — ?NITROFURANTOIN-MACRO 100 M: 100 | 7 days supply | Qty: 14 | Fill #0

## 2017-02-25 NOTE — ED Provider Notes (Signed)
[facility]    CSN: [id number] Arrival date & time: 02/25/17  1002     History   Chief Complaint Chief Complaint  Patient presents with  . Dysuria  . Hyperglycemia    [name] is a 56 y.o. female.    Who carries a history of Type 2 DM with neuropathy, and HTN. She presents with dysuria and lower back achiness x 3 days. Her blood glucose has also reported to be elevated. She reports having  "blood infection from a UTI" in the past. She has trouble actually "feeling bladder pain". She currently denies fevers, chills or emesis. She has been slightly nauseated. She uses her Insulin at times on a sliding scale. She has not taken her anti-hypertensives today. She feels some "pressure" over the pelvic region as well. She has these symtpoms typically when experiencing a UTI.       Past Medical History:  Diagnosis Date  . Bell's palsy 2005  . Diabetes mellitus without complication (HCC)   . Hypertension   . Neuromuscular disorder (HCC)    NEUROPATHY  . Neuropathy   . Sepsis (HCC) 02/11/2016    Patient Active Problem List   Diagnosis Date Noted  . Sepsis (HCC) 02/10/2016  . Diabetic ketoacidosis without coma associated with type 2 diabetes mellitus (HCC)   . Lactic acidosis   . Urinary tract infection without hematuria   . Essential hypertension   . 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 Hypokalemia 03/15/2014  . Foot abscess, left 03/15/2014  . Wound infection 03/14/2014  . Diabetic foot ulcer (HCC) 03/14/2014  . DM type 2 (diabetes mellitus, type 2) (HCC) 03/14/2014  . Cellulitis of left foot 03/14/2014  . Bell's palsy   . VAGINITIS NOS 09/30/2006  . Depression, major, single episode, mild (HCC) 08/05/2006  . Diabetic neuropathy (HCC) 08/05/2006  . HYPERTENSION 08/05/2006  . Insomnia 08/05/2006  . BELL'S PALSY, HX OF 08/05/2006  . HYSTERECTOMY, HX OF 08/05/2006  . CHOLECYSTECTOMY, HX OF 08/05/2006    Past Surgical History:   Procedure Laterality Date  . AMPUTATION Left 03/15/2014   Procedure: AMPUTATION TRANSMETATARSAL;  Surgeon: Nadara Mustard, MD;  Location: [facility] OR;  Service: Orthopedics;  Laterality: Left;  . CESAREAN SECTION    . CHOLECYSTECTOMY      OB History    No data available       Home Medications    Prior to Admission medications   Medication Sig Start Date End Date Taking? Authorizing Provider  acetaminophen (TYLENOL) 500 MG tablet Take 1,000 mg by mouth every 6 (six) hours as needed for fever.    [provider]  amitriptyline (ELAVIL) 100 MG tablet Take 1 tablet (100 mg total) by mouth at bedtime. 11/16/16   Jaclyn Shaggy, MD  aspirin EC 81 MG tablet Take 81 mg by mouth at bedtime.     [provider]  atorvastatin (LIPITOR) 40 MG tablet Take 1 tablet (40 mg total) by mouth daily. 11/16/16   Jaclyn Shaggy, MD  cetirizine (ZYRTEC) 10 MG tablet Take 1 tablet (10 mg total) by mouth daily. 12/09/16   Jaclyn Shaggy, MD  Cyanocobalamin (VITAMIN B-12 PO) Take 1 tablet by mouth 2 (two) times daily.    [provider]  DULoxetine (CYMBALTA) 60 MG capsule Take 1 capsule (60 mg total) by mouth daily. 11/16/16   Jaclyn Shaggy, MD  gabapentin (NEURONTIN) 600 MG tablet Take 1 tablet (600 mg total) by mouth 3 (three) times daily.  11/16/16   Jaclyn ShaggyAmao, Enobong, MD  guaiFENesin (MUCINEX) 600 MG 12 hr tablet Take 1 tablet (600 mg total) by mouth 2 (two) times daily. 12/09/16   Jaclyn ShaggyAmao, Enobong, MD  INS SYRINGE/NEEDLE .5CC/27G 27G X 1/2" 0.5 ML MISC Use as directed 10/29/16   Jaclyn ShaggyAmao, Enobong, MD  insulin NPH-regular Human (HUMULIN 70/30) (70-30) 100 UNIT/ML injection Inject 50 units subcutaneously twice daily before breakfast and dinner 12/09/16   Jaclyn ShaggyAmao, Enobong, MD  lisinopril (PRINIVIL,ZESTRIL) 20 MG tablet Take 1 tablet (20 mg total) by mouth daily. 11/16/16   Jaclyn ShaggyAmao, Enobong, MD  metFORMIN (GLUCOPHAGE) 500 MG tablet TAKE 1 TABLET BY MOUTH TWICE DAILY WITH A MEAL 08/24/16   Jaclyn ShaggyAmao, Enobong, MD  metoprolol  tartrate (LOPRESSOR) 50 MG tablet Take 1 tablet (50 mg total) by mouth 2 (two) times daily. 12/09/16   Jaclyn ShaggyAmao, Enobong, MD  oxybutynin (DITROPAN) 5 MG tablet Take 1 tablet (5 mg total) by mouth 2 (two) times daily. 11/16/16   Jaclyn ShaggyAmao, Enobong, MD    Family History Family History  Problem Relation Age of Onset  . Heart attack Mother   . Diabetes Father   . Diabetes Sister     Social History Social History   Tobacco Use  . Smoking status: Former Smoker    Packs/day: 1.00    Years: 38.00    Pack years: 38.00    Types: Cigarettes    Last attempt to quit: 09/19/2013    Years since quitting: 3.4  . Smokeless tobacco: Never Used  Substance Use Topics  . Alcohol use: No  . Drug use: Yes    Types: Marijuana     Allergies   Patient has no known allergies.   Review of Systems Review of Systems  Constitutional: Negative for chills, fatigue and fever.  Respiratory: Negative for shortness of breath.   Cardiovascular: Negative for chest pain.  Gastrointestinal: Positive for nausea. Negative for abdominal distention, abdominal pain, constipation, diarrhea and vomiting.       +suprapubic pressure  Endocrine: Positive for polyuria.  Genitourinary: Positive for dysuria and flank pain. Negative for decreased urine volume and pelvic pain.  Musculoskeletal: Positive for back pain.  Skin: Negative.   Psychiatric/Behavioral: Negative.      Physical Exam Triage Vital Signs ED Triage Vitals  Enc Vitals Group     BP 02/25/17 1031 (!) 167/94     Pulse Rate 02/25/17 1031 82     Resp 02/25/17 1031 16     Temp 02/25/17 1031 (!) 97.4 F (36.3 C)     Temp src --      SpO2 02/25/17 1031 95 %     Weight --      Height --      Head Circumference --      Peak Flow --      Pain Score 02/25/17 1032 9     Pain Loc --      Pain Edu? --      Excl. in GC? --    No data found.  Updated Vital Signs BP (!) 167/94   Pulse 82   Temp (!) 97.4 F (36.3 C)   Resp 16   SpO2 95%   Visual  Acuity Right Eye Distance:   Left Eye Distance:   Bilateral Distance:    Right Eye Near:   Left Eye Near:    Bilateral Near:     Physical Exam  Constitutional: She is oriented to person, place, and time. She appears well-developed and well-nourished. No distress.  Appears well and in NAD  Cardiovascular: Normal rate and regular rhythm.  Pulmonary/Chest: Effort normal and breath sounds normal.  Abdominal: Soft. Bowel sounds are normal. She exhibits no distension and no mass. There is no tenderness. There is no guarding.  Musculoskeletal:  No pain to CVA percussion, no pain to palpation with lower back, full ROM without discomfort  Neurological: She is alert and oriented to person, place, and time.  Skin: Skin is warm and dry. No rash noted. She is not diaphoretic.  Psychiatric: Her behavior is normal.  Nursing note and vitals reviewed.    UC Treatments / Results  Labs (all labs ordered are listed, but only abnormal results are displayed) Labs Reviewed  POCT URINALYSIS DIP (DEVICE) - Abnormal; Notable for the following components:      Result Value   Protein, ur 100 (*)    Nitrite POSITIVE (*)    All other components within normal limits    EKG  EKG Interpretation None       Radiology No results found.  Procedures Procedures (including critical care time)  Medications Ordered in UC Medications - No data to display   Initial Impression / Assessment and Plan / UC Course  I have reviewed the triage vital signs and the nursing notes.  Pertinent labs & imaging results that were available during my care of the patient were reviewed by me and considered in my medical decision making (see chart for details).   Cover for UTI based on symptoms and past history. +nitrates will culture as well. Glucose and istat is stable here. Recommend f/u with PCP for continued DM management. If worsens (we discussed) then please present to the ED for emergent symptoms.      Final  Clinical Impressions(s) / UC Diagnoses   Final diagnoses:  None    ED Discharge Orders    None       Controlled Substance Prescriptions [place] Controlled Substance Registry consulted? Not Applicable   Riki SheerYoung, Michelle G, PA-C 02/25/17 1159

## 2017-02-25 NOTE — ED Triage Notes (Signed)
Pt c/o high blood sugar x3 weeks, over 300. Takes metformin and insulin. Pt also c/o R flank pain and burning with urination.

## 2017-02-25 NOTE — Discharge Instructions (Signed)
You most likely have a UTI. We will treat you and also run a culture for completeness. Call you if we need to change your antibiotics. Push water. If you worsen then please go to the ED. Continue your diabetic medications as prescribed. Glucose is stable today. Continue with BP meds and ok to take when get home. Feel better.

## 2017-02-27 LAB — URINE CULTURE

## 2017-03-09 ENCOUNTER — Other Ambulatory Visit: Payer: Self-pay | Admitting: Family Medicine

## 2017-03-11 ENCOUNTER — Ambulatory Visit: Payer: Self-pay | Admitting: Family Medicine

## 2017-03-17 MED FILL — ?METFORMIN HCL 500MG TABLET: 500 | 30 days supply | Qty: 60 | Fill #5

## 2017-03-17 MED FILL — ?METOPROLOL 50 MG TABLET: 50 | 30 days supply | Qty: 60 | Fill #2

## 2017-03-22 MED FILL — AMITRIPTYLINE HCL 100 MG TA: 100 | 30 days supply | Qty: 30 | Fill #1

## 2017-03-22 MED FILL — LISINOPRIL 20 MG TAB: 20 | 30 days supply | Qty: 30 | Fill #5

## 2017-03-22 MED FILL — OXYBUTYNIN 5 MG TABLET: 5 | 30 days supply | Qty: 60 | Fill #3

## 2017-03-22 MED FILL — ?DULOXETINE HCL DR 60 MG CA: 60 MG | 30 days supply | Qty: 30 | Fill #4

## 2017-03-22 MED FILL — $Humulin 70/30 vial: (70-30) 100 | 30 days supply | Qty: 30 | Fill #3

## 2017-03-22 MED FILL — GABAPENTIN 600 MG TABLET: 600 | 30 days supply | Qty: 90 | Fill #4

## 2017-03-22 MED FILL — ?CETIRIZINE HCL 10 MG TABLE: 10 | 30 days supply | Qty: 30 | Fill #0

## 2017-03-22 MED FILL — ?ATORVASTATIN 40MG TABLET: 40 | 30 days supply | Qty: 30 | Fill #1

## 2017-04-13 ENCOUNTER — Other Ambulatory Visit: Payer: Self-pay | Admitting: Pharmacist

## 2017-04-13 ENCOUNTER — Ambulatory Visit: Payer: Self-pay | Attending: Family Medicine | Admitting: Family Medicine

## 2017-04-13 ENCOUNTER — Encounter: Payer: Self-pay | Admitting: Family Medicine

## 2017-04-13 VITALS — BP 143/83 | HR 71 | Temp 98.0°F | Ht 64.0 in | Wt 198.6 lb

## 2017-04-13 DIAGNOSIS — E1149 Type 2 diabetes mellitus with other diabetic neurological complication: Secondary | ICD-10-CM

## 2017-04-13 DIAGNOSIS — Z7982 Long term (current) use of aspirin: Secondary | ICD-10-CM | POA: Insufficient documentation

## 2017-04-13 DIAGNOSIS — Z794 Long term (current) use of insulin: Secondary | ICD-10-CM | POA: Insufficient documentation

## 2017-04-13 DIAGNOSIS — E11319 Type 2 diabetes mellitus with unspecified diabetic retinopathy without macular edema: Secondary | ICD-10-CM | POA: Insufficient documentation

## 2017-04-13 DIAGNOSIS — Z79899 Other long term (current) drug therapy: Secondary | ICD-10-CM | POA: Insufficient documentation

## 2017-04-13 DIAGNOSIS — Z9889 Other specified postprocedural states: Secondary | ICD-10-CM | POA: Insufficient documentation

## 2017-04-13 DIAGNOSIS — Z9049 Acquired absence of other specified parts of digestive tract: Secondary | ICD-10-CM | POA: Insufficient documentation

## 2017-04-13 DIAGNOSIS — I10 Essential (primary) hypertension: Secondary | ICD-10-CM | POA: Insufficient documentation

## 2017-04-13 DIAGNOSIS — E1142 Type 2 diabetes mellitus with diabetic polyneuropathy: Secondary | ICD-10-CM | POA: Insufficient documentation

## 2017-04-13 DIAGNOSIS — G4709 Other insomnia: Secondary | ICD-10-CM

## 2017-04-13 DIAGNOSIS — G4700 Insomnia, unspecified: Secondary | ICD-10-CM | POA: Insufficient documentation

## 2017-04-13 DIAGNOSIS — N393 Stress incontinence (female) (male): Secondary | ICD-10-CM | POA: Insufficient documentation

## 2017-04-13 DIAGNOSIS — J00 Acute nasopharyngitis [common cold]: Secondary | ICD-10-CM | POA: Insufficient documentation

## 2017-04-13 LAB — GLUCOSE, POCT (MANUAL RESULT ENTRY): POC Glucose: 144 mg/dl — AB (ref 70–99)

## 2017-04-13 LAB — POCT GLYCOSYLATED HEMOGLOBIN (HGB A1C): Hemoglobin A1C: 10.5

## 2017-04-13 MED ORDER — CETIRIZINE HCL 10 MG PO TABS: 10.0000 mg | ORAL_TABLET | Freq: Every day | ORAL | 1 refills | Status: DC

## 2017-04-13 MED ORDER — "INSULIN SYRINGE-NEEDLE U-100 30G X 5/16"" 1 ML MISC": 2 refills | Status: AC

## 2017-04-13 MED ORDER — METFORMIN HCL 500 MG PO TABS: ORAL_TABLET | ORAL | 5 refills | Status: DC

## 2017-04-13 MED ORDER — OXYBUTYNIN CHLORIDE ER 10 MG PO TB24
10.0000 mg | ORAL_TABLET | Freq: Every day | ORAL | 6 refills | Status: DC
Start: 2017-04-13 — End: 2017-08-24

## 2017-04-13 MED ORDER — DULOXETINE HCL 60 MG PO CPEP: 60.0000 mg | ORAL_CAPSULE | Freq: Every day | ORAL | 5 refills | Status: DC

## 2017-04-13 MED ORDER — INSULIN NPH ISOPHANE & REGULAR (70-30) 100 UNIT/ML ~~LOC~~ SUSP: SUBCUTANEOUS | 3 refills | Status: DC

## 2017-04-13 MED ORDER — LISINOPRIL 20 MG PO TABS: 20.0000 mg | ORAL_TABLET | Freq: Every day | ORAL | 5 refills | Status: DC

## 2017-04-13 MED ORDER — AMITRIPTYLINE HCL 100 MG PO TABS: 100.0000 mg | ORAL_TABLET | Freq: Every day | ORAL | 5 refills | Status: DC

## 2017-04-13 MED ORDER — PREGABALIN 100 MG PO CAPS: 100.0000 mg | ORAL_CAPSULE | Freq: Two times a day (BID) | ORAL | 3 refills | Status: DC

## 2017-04-13 MED ORDER — METOPROLOL TARTRATE 50 MG PO TABS: 50.0000 mg | ORAL_TABLET | Freq: Two times a day (BID) | ORAL | 5 refills | Status: DC

## 2017-04-13 MED ORDER — ATORVASTATIN CALCIUM 40 MG PO TABS: 40.0000 mg | ORAL_TABLET | Freq: Every day | ORAL | 5 refills | Status: DC

## 2017-04-13 MED ORDER — GABAPENTIN 600 MG PO TABS: 600.0000 mg | ORAL_TABLET | Freq: Three times a day (TID) | ORAL | 1 refills | Status: DC

## 2017-04-13 NOTE — Progress Notes (Signed)
Subjective:  Patient ID: Anna Wagner, female    DOB: 03-28-1960  Age: 57 y.o. MRN: [id number]  CC: Diabetes   [name] is a 57 year old female with a history of type 2 diabetes mellitus (A1c 10.2), diabetic neuropathy, hypertension, insomnia, depression, transmetatarsal amputation of the left foot who presents today for follow-up for diabetes mellitus.  A1c is 10.2 and has slightly improved from 11.8 previously. She has not been compliant with a Diabetic diet as she induges in sweets when she is bored. Her neuropathy has been uncontrolled on Gabapentin as she experiences numbness in her hands and feet. Denies visual concerns.  Incontinence is uncontrolled and she requests an increase in her dose of Oxybytnin. Her phq 9 is elevated and she attributes this to worrying about her children, grandkids, her son -in -law who is a [employer] and had not been paid for 4 weeks. Denies suicidal ideation or intents.  She is currently congested and states she is getting over a cold. Denies fever. Past Medical History:  Diagnosis Date  . Bell's palsy 2005  . Diabetes mellitus without complication ([place])   . Hypertension   . Neuromuscular disorder ([place])    NEUROPATHY  . Neuropathy   . Sepsis ([place]) 02/11/2016    Past Surgical History:  Procedure Laterality Date  . AMPUTATION Left 03/15/2014   Procedure: AMPUTATION TRANSMETATARSAL;  Surgeon: Newt Minion, MD;  Location: [facility];  Service: Orthopedics;  Laterality: Left;  . CESAREAN SECTION    . CHOLECYSTECTOMY      No Known Allergies   Outpatient Medications Prior to Visit  Medication Sig Dispense Refill  . acetaminophen (TYLENOL) 500 MG tablet Take 1,000 mg by mouth every 6 (six) hours as needed for fever.    Marland Kitchen aspirin EC 81 MG tablet Take 81 mg by mouth at bedtime.     . Cyanocobalamin (VITAMIN B-12 PO) Take 1 tablet by mouth 2 (two) times daily.    Marland Kitchen amitriptyline (ELAVIL) 100 MG tablet Take 1 tablet (100 mg total) by mouth at  bedtime. 30 tablet 5  . atorvastatin (LIPITOR) 40 MG tablet Take 1 tablet (40 mg total) by mouth daily. 30 tablet 3  . cetirizine (ZYRTEC) 10 MG tablet TAKE 1 TABLET (10 MG TOTAL) BY MOUTH DAILY. 30 tablet 1  . DULoxetine (CYMBALTA) 60 MG capsule Take 1 capsule (60 mg total) by mouth daily. 30 capsule 5  . gabapentin (NEURONTIN) 600 MG tablet Take 1 tablet (600 mg total) by mouth 3 (three) times daily. 90 tablet 5  . INS SYRINGE/NEEDLE .5CC/27G 27G X 1/2" 0.5 ML MISC Use as directed 10 each 12  . insulin NPH-regular Human (HUMULIN 70/30) (70-30) 100 UNIT/ML injection Inject 50 units subcutaneously twice daily before breakfast and dinner 90 mL 3  . lisinopril (PRINIVIL,ZESTRIL) 20 MG tablet Take 1 tablet (20 mg total) by mouth daily. 30 tablet 5  . metFORMIN (GLUCOPHAGE) 500 MG tablet TAKE 1 TABLET BY MOUTH TWICE DAILY WITH A MEAL 60 tablet 5  . metoprolol tartrate (LOPRESSOR) 50 MG tablet Take 1 tablet (50 mg total) by mouth 2 (two) times daily. 60 tablet 3  . oxybutynin (DITROPAN) 5 MG tablet Take 1 tablet (5 mg total) by mouth 2 (two) times daily. 60 tablet 3  . guaiFENesin (MUCINEX) 600 MG 12 hr tablet Take 1 tablet (600 mg total) by mouth 2 (two) times daily. (Patient not taking: Reported on 04/13/2017) 20 tablet 0   No facility-administered medications prior to  visit.     ROS Review of Systems  Constitutional: Negative for activity change, appetite change and fatigue.  HENT: Positive for congestion. Negative for sinus pressure and sore throat.   Eyes: Negative for visual disturbance.  Respiratory: Negative for cough, chest tightness, shortness of breath and wheezing.   Cardiovascular: Negative for chest pain and palpitations.  Gastrointestinal: Negative for abdominal distention, abdominal pain and constipation.  Endocrine: Negative for polydipsia.  Genitourinary: Negative for dysuria and frequency.  Musculoskeletal: Negative for arthralgias and back pain.  Skin: Negative for rash.    Neurological: Positive for numbness. Negative for tremors and light-headedness.  Hematological: Does not bruise/bleed easily.  Psychiatric/Behavioral: Negative for agitation and behavioral problems.    Objective:  BP (!) 143/83   Pulse 71   Temp 98 F (36.7 C) (Oral)   Ht '5\' 4"'  (1.626 m)   Wt 198 lb 9.6 oz (90.1 kg)   SpO2 94%   BMI 34.09 kg/m   BP/Weight 04/13/2017 02/25/2017 54/56/2563  Systolic BP 893 734 287  Diastolic BP 83 94 82  Wt. (Lbs) 198.6 - -  BMI 34.09 - -      Physical Exam  Constitutional: She is oriented to person, place, and time. She appears well-developed and well-nourished.  Cardiovascular: Normal rate, normal heart sounds and intact distal pulses.  No murmur heard. Pulmonary/Chest: Effort normal and breath sounds normal. She has no wheezes. She has no rales. She exhibits no tenderness.  Abdominal: Soft. Bowel sounds are normal. She exhibits no distension and no mass. There is no tenderness.  Musculoskeletal: Normal range of motion.  Left foot transmetatarsal amputation  Neurological: She is alert and oriented to person, place, and time.  Psychiatric: She has a normal mood and affect.     CMP Latest Ref Rng & Units 02/25/2017 08/24/2016 02/13/2016  Glucose 65 - 99 mg/dL 168(H) 247(H) 127(H)  BUN 6 - 20 mg/dL '12 16 8  ' Creatinine 0.44 - 1.00 mg/dL 0.50 0.53(L) 0.60  Sodium 135 - 145 mmol/L 138 136 134(L)  Potassium 3.5 - 5.1 mmol/L 4.0 4.6 3.3(L)  Chloride 101 - 111 mmol/L 103 100 100(L)  CO2 20 - 29 mmol/L - 18(L) 20(L)  Calcium 8.7 - 10.2 mg/dL - 9.9 8.6(L)  Total Protein 6.0 - 8.5 g/dL - 6.9 -  Total Bilirubin 0.0 - 1.2 mg/dL - <0.2 -  Alkaline Phos 39 - 117 IU/L - 95 -  AST 0 - 40 IU/L - 14 -  ALT 0 - 32 IU/L - 19 -    Lipid Panel     Component Value Date/Time   CHOL 220 (H) 08/24/2016 1023   TRIG 753 (HH) 08/24/2016 1023   HDL 33 (L) 08/24/2016 1023   CHOLHDL 6.7 (H) 08/24/2016 1023   CHOLHDL 6.8 02/10/2016 1844   VLDL UNABLE TO  CALCULATE IF TRIGLYCERIDE OVER 400 mg/dL 02/10/2016 1844   LDLCALC Comment 08/24/2016 1023    Lab Results  Component Value Date   HGBA1C 10.5 04/13/2017    Assessment & Plan:   1. Type 2 diabetes mellitus with retinopathy without macular edema, with long-term current use of insulin, unspecified laterality, unspecified retinopathy severity ([place]) Uncontrolled DM with A1c of 10.5 Increased Novolog 70/30 dose to 55 units bid Counseled on Diabetic diet, my plate method, 681 minutes of moderate intensity exercise/week Keep blood sugar logs with fasting goals of 80-120 mg/dl, random of less than 180 and in the event of sugars less than 60 mg/dl or greater than 400 mg/dl  please notify the clinic ASAP. It is recommended that you undergo annual eye exams and annual foot exams. Pneumonia vaccine is recommended. - POCT glucose (manual entry) - POCT glycosylated hemoglobin (Hb A1C) - CMP14+EGFR; Future - Lipid panel; Future - Microalbumin/Creatinine Ratio, Urine; Future - metFORMIN (GLUCOPHAGE) 500 MG tablet; TAKE 1 TABLET BY MOUTH TWICE DAILY WITH A MEAL  Dispense: 60 tablet; Refill: 5 - atorvastatin (LIPITOR) 40 MG tablet; Take 1 tablet (40 mg total) by mouth daily.  Dispense: 30 tablet; Refill: 5  2. Stress incontinence of urine Uncontrolled Increase dose of Oxybutynin Kegel exercises - oxybutynin (DITROPAN XL) 10 MG 24 hr tablet; Take 1 tablet (10 mg total) by mouth at bedtime.  Dispense: 30 tablet; Refill: 6  3. Acute nasopharyngitis Resolving, no antibiotics indicated - cetirizine (ZYRTEC) 10 MG tablet; Take 1 tablet (10 mg total) by mouth daily.  Dispense: 30 tablet; Refill: 1  4. Essential hypertension Slightly elevated No regimen change Counseled on blood pressure goal of less than 130/80, low-sodium, DASH diet, medication compliance, 150 minutes of moderate intensity exercise per week. Discussed medication compliance, adverse effects. - metoprolol tartrate (LOPRESSOR) 50 MG  tablet; Take 1 tablet (50 mg total) by mouth 2 (two) times daily.  Dispense: 60 tablet; Refill: 5 - lisinopril (PRINIVIL,ZESTRIL) 20 MG tablet; Take 1 tablet (20 mg total) by mouth daily.  Dispense: 30 tablet; Refill: 5  5. Other insomnia Controlled - amitriptyline (ELAVIL) 100 MG tablet; Take 1 tablet (100 mg total) by mouth at bedtime.  Dispense: 30 tablet; Refill: 5  6. Other diabetic neurological complication associated with type 2 diabetes mellitus ([place]) Uncontrolled Placed on Lyrica and she has been instructed to discontinue Gabapentin once Lyrica is obtained through the PAS program - pregabalin (LYRICA) 100 MG capsule; Take 1 capsule (100 mg total) by mouth 2 (two) times daily.  Dispense: 90 capsule; Refill: 3 - DULoxetine (CYMBALTA) 60 MG capsule; Take 1 capsule (60 mg total) by mouth daily.  Dispense: 30 capsule; Refill: 5 - gabapentin (NEURONTIN) 600 MG tablet; Take 1 tablet (600 mg total) by mouth 3 (three) times daily. Discontinue once Lyrica is available through Patient assistance program  Dispense: 90 tablet; Refill: 1   Meds ordered this encounter  Medications  . insulin NPH-regular Human (HUMULIN 70/30) (70-30) 100 UNIT/ML injection    Sig: Inject 55 units subcutaneously twice daily before breakfast and dinner    Dispense:  90 mL    Refill:  3    Discontinue previous dose  . metFORMIN (GLUCOPHAGE) 500 MG tablet    Sig: TAKE 1 TABLET BY MOUTH TWICE DAILY WITH A MEAL    Dispense:  60 tablet    Refill:  5  . metoprolol tartrate (LOPRESSOR) 50 MG tablet    Sig: Take 1 tablet (50 mg total) by mouth 2 (two) times daily.    Dispense:  60 tablet    Refill:  5  . oxybutynin (DITROPAN XL) 10 MG 24 hr tablet    Sig: Take 1 tablet (10 mg total) by mouth at bedtime.    Dispense:  30 tablet    Refill:  6    Discontinue 5 mg  . pregabalin (LYRICA) 100 MG capsule    Sig: Take 1 capsule (100 mg total) by mouth 2 (two) times daily.    Dispense:  90 capsule    Refill:  3  .  amitriptyline (ELAVIL) 100 MG tablet    Sig: Take 1 tablet (100 mg total) by mouth at bedtime.  Dispense:  30 tablet    Refill:  5  . atorvastatin (LIPITOR) 40 MG tablet    Sig: Take 1 tablet (40 mg total) by mouth daily.    Dispense:  30 tablet    Refill:  5  . cetirizine (ZYRTEC) 10 MG tablet    Sig: Take 1 tablet (10 mg total) by mouth daily.    Dispense:  30 tablet    Refill:  1  . DULoxetine (CYMBALTA) 60 MG capsule    Sig: Take 1 capsule (60 mg total) by mouth daily.    Dispense:  30 capsule    Refill:  5  . gabapentin (NEURONTIN) 600 MG tablet    Sig: Take 1 tablet (600 mg total) by mouth 3 (three) times daily. Discontinue once Lyrica is available through Patient assistance program    Dispense:  90 tablet    Refill:  1  . lisinopril (PRINIVIL,ZESTRIL) 20 MG tablet    Sig: Take 1 tablet (20 mg total) by mouth daily.    Dispense:  30 tablet    Refill:  5    Follow-up: Return in about 3 months (around 07/12/2017) for Follow-up on diabetes mellitus.   [name] MD

## 2017-04-13 NOTE — Patient Instructions (Signed)
Diabetes Mellitus and Nutrition When you have diabetes (diabetes mellitus), it is very important to have healthy eating habits because your blood sugar (glucose) levels are greatly affected by what you eat and drink. Eating healthy foods in the appropriate amounts, at about the same times every day, can help you:  Control your blood glucose.  Lower your risk of heart disease.  Improve your blood pressure.  Reach or maintain a healthy weight.  Every person with diabetes is different, and each person has different needs for a meal plan. Your health care provider may recommend that you work with a diet and nutrition specialist (dietitian) to make a meal plan that is best for you. Your meal plan may vary depending on factors such as:  The calories you need.  The medicines you take.  Your weight.  Your blood glucose, blood pressure, and cholesterol levels.  Your activity level.  Other health conditions you have, such as heart or kidney disease.  How do carbohydrates affect me? Carbohydrates affect your blood glucose level more than any other type of food. Eating carbohydrates naturally increases the amount of glucose in your blood. Carbohydrate counting is a method for keeping track of how many carbohydrates you eat. Counting carbohydrates is important to keep your blood glucose at a healthy level, especially if you use insulin or take certain oral diabetes medicines. It is important to know how many carbohydrates you can safely have in each meal. This is different for every person. Your dietitian can help you calculate how many carbohydrates you should have at each meal and for snack. Foods that contain carbohydrates include:  Bread, cereal, rice, pasta, and crackers.  Potatoes and corn.  Peas, beans, and lentils.  Milk and yogurt.  Fruit and juice.  Desserts, such as cakes, cookies, ice cream, and candy.  How does alcohol affect me? Alcohol can cause a sudden decrease in blood  glucose (hypoglycemia), especially if you use insulin or take certain oral diabetes medicines. Hypoglycemia can be a life-threatening condition. Symptoms of hypoglycemia (sleepiness, dizziness, and confusion) are similar to symptoms of having too much alcohol. If your health care provider says that alcohol is safe for you, follow these guidelines:  Limit alcohol intake to no more than 1 drink per day for nonpregnant women and 2 drinks per day for men. One drink equals 12 oz of beer, 5 oz of wine, or 1 oz of hard liquor.  Do not drink on an empty stomach.  Keep yourself hydrated with water, diet soda, or unsweetened iced tea.  Keep in mind that regular soda, juice, and other mixers may contain a lot of sugar and must be counted as carbohydrates.  What are tips for following this plan? Reading food labels  Start by checking the serving size on the label. The amount of calories, carbohydrates, fats, and other nutrients listed on the label are based on one serving of the food. Many foods contain more than one serving per package.  Check the total grams (g) of carbohydrates in one serving. You can calculate the number of servings of carbohydrates in one serving by dividing the total carbohydrates by 15. For example, if a food has 30 g of total carbohydrates, it would be equal to 2 servings of carbohydrates.  Check the number of grams (g) of saturated and trans fats in one serving. Choose foods that have low or no amount of these fats.  Check the number of milligrams (mg) of sodium in one serving. Most people   should limit total sodium intake to less than 2,300 mg per day.  Always check the nutrition information of foods labeled as "low-fat" or "nonfat". These foods may be higher in added sugar or refined carbohydrates and should be avoided.  Talk to your dietitian to identify your daily goals for nutrients listed on the label. Shopping  Avoid buying canned, premade, or processed foods. These  foods tend to be high in fat, sodium, and added sugar.  Shop around the outside edge of the grocery store. This includes fresh fruits and vegetables, bulk grains, fresh meats, and fresh dairy. Cooking  Use low-heat cooking methods, such as baking, instead of high-heat cooking methods like deep frying.  Cook using healthy oils, such as olive, canola, or sunflower oil.  Avoid cooking with butter, cream, or high-fat meats. Meal planning  Eat meals and snacks regularly, preferably at the same times every day. Avoid going long periods of time without eating.  Eat foods high in fiber, such as fresh fruits, vegetables, beans, and whole grains. Talk to your dietitian about how many servings of carbohydrates you can eat at each meal.  Eat 4-6 ounces of lean protein each day, such as lean meat, chicken, fish, eggs, or tofu. 1 ounce is equal to 1 ounce of meat, chicken, or fish, 1 egg, or 1/4 cup of tofu.  Eat some foods each day that contain healthy fats, such as avocado, nuts, seeds, and fish. Lifestyle   Check your blood glucose regularly.  Exercise at least 30 minutes 5 or more days each week, or as told by your health care provider.  Take medicines as told by your health care provider.  Do not use any products that contain nicotine or tobacco, such as cigarettes and e-cigarettes. If you need help quitting, ask your health care provider.  Work with a counselor or diabetes educator to identify strategies to manage stress and any emotional and social challenges. What are some questions to ask my health care provider?  Do I need to meet with a diabetes educator?  Do I need to meet with a dietitian?  What number can I call if I have questions?  When are the best times to check my blood glucose? Where to find more information:  American Diabetes Association: diabetes.org/food-and-fitness/food  Academy of Nutrition and Dietetics:  www.eatright.org/resources/health/diseases-and-conditions/diabetes  National Institute of Diabetes and Digestive and Kidney Diseases (NIH): www.niddk.nih.gov/health-information/diabetes/overview/diet-eating-physical-activity Summary  A healthy meal plan will help you control your blood glucose and maintain a healthy lifestyle.  Working with a diet and nutrition specialist (dietitian) can help you make a meal plan that is best for you.  Keep in mind that carbohydrates and alcohol have immediate effects on your blood glucose levels. It is important to count carbohydrates and to use alcohol carefully. This information is not intended to replace advice given to you by your health care provider. Make sure you discuss any questions you have with your health care provider. Document Released: 11/26/2004 Document Revised: 04/05/2016 Document Reviewed: 04/05/2016 Elsevier Interactive Patient Education  2018 Elsevier Inc.  

## 2017-04-14 ENCOUNTER — Other Ambulatory Visit: Payer: Self-pay

## 2017-04-19 ENCOUNTER — Ambulatory Visit: Payer: Self-pay | Attending: Family Medicine

## 2017-04-19 DIAGNOSIS — Z794 Long term (current) use of insulin: Secondary | ICD-10-CM | POA: Insufficient documentation

## 2017-04-19 DIAGNOSIS — E11319 Type 2 diabetes mellitus with unspecified diabetic retinopathy without macular edema: Secondary | ICD-10-CM | POA: Insufficient documentation

## 2017-04-19 MED FILL — ?METOPROLOL TARTRTE 50MG TA: 50 | 30 days supply | Qty: 60 | Fill #0

## 2017-04-19 MED FILL — $Humulin 70/30 vial: (70-30) 100 | 27 days supply | Qty: 30 | Fill #0

## 2017-04-19 MED FILL — ?METFORMIN HCL 500MG TABLET: 500 | 30 days supply | Qty: 60 | Fill #0

## 2017-04-19 NOTE — Progress Notes (Signed)
Patient here for lab visit only 

## 2017-04-20 LAB — MICROALBUMIN / CREATININE URINE RATIO
CREATININE, UR: 39 mg/dL
Microalb/Creat Ratio: 891.3 mg/g creat — ABNORMAL HIGH (ref 0.0–30.0)
Microalbumin, Urine: 347.6 ug/mL

## 2017-04-20 LAB — CMP14+EGFR
ALBUMIN: 4.2 g/dL (ref 3.5–5.5)
ALK PHOS: 118 IU/L — AB (ref 39–117)
ALT: 33 IU/L — ABNORMAL HIGH (ref 0–32)
AST: 18 IU/L (ref 0–40)
Albumin/Globulin Ratio: 1.6 (ref 1.2–2.2)
BUN / CREAT RATIO: 27 — AB (ref 9–23)
BUN: 17 mg/dL (ref 6–24)
Bilirubin Total: 0.2 mg/dL (ref 0.0–1.2)
CALCIUM: 9 mg/dL (ref 8.7–10.2)
CO2: 22 mmol/L (ref 20–29)
CREATININE: 0.63 mg/dL (ref 0.57–1.00)
Chloride: 98 mmol/L (ref 96–106)
GFR calc Af Amer: 116 mL/min/{1.73_m2} (ref >59)
GFR, EST NON AFRICAN AMERICAN: 101 mL/min/{1.73_m2} (ref >59)
GLUCOSE: 321 mg/dL — AB (ref 65–99)
Globulin, Total: 2.7 g/dL (ref 1.5–4.5)
Potassium: 4.7 mmol/L (ref 3.5–5.2)
Sodium: 137 mmol/L (ref 134–144)
Total Protein: 6.9 g/dL (ref 6.0–8.5)

## 2017-04-20 LAB — LIPID PANEL
Chol/HDL Ratio: 3.4 ratio (ref 0.0–4.4)
Cholesterol, Total: 117 mg/dL (ref 100–199)
HDL: 34 mg/dL — ABNORMAL LOW (ref >39)
LDL Calculated: 26 mg/dL (ref 0–99)
Triglycerides: 285 mg/dL — ABNORMAL HIGH (ref 0–149)
VLDL CHOLESTEROL CAL: 57 mg/dL — AB (ref 5–40)

## 2017-04-21 MED FILL — DULoxetine HCL 60 MG CPEP: 60 | 30 days supply | Qty: 30 | Fill #5

## 2017-04-21 MED FILL — AMITRIPTYLINE HCL 100 MG TA: 100 | 30 days supply | Qty: 30 | Fill #2

## 2017-04-21 MED FILL — ?CETIRIZINE HCL 10 MG TABLE: 10 | 30 days supply | Qty: 30 | Fill #1

## 2017-04-21 MED FILL — OXYBUTYNIN 5 MG TABLET: 5 | 30 days supply | Qty: 60 | Fill #0

## 2017-04-21 MED FILL — GABAPENTIN 600 MG TABLET: 600 | 30 days supply | Qty: 90 | Fill #5

## 2017-04-21 MED FILL — ?ATORVASTATIN 40MG TABLET: 40 | 30 days supply | Qty: 30 | Fill #2

## 2017-04-21 MED FILL — LISINOPRIL 20 MG TAB: 20 | 30 days supply | Qty: 30 | Fill #0

## 2017-04-25 ENCOUNTER — Ambulatory Visit: Payer: Self-pay | Attending: Family Medicine

## 2017-05-11 ENCOUNTER — Other Ambulatory Visit: Payer: Self-pay

## 2017-05-11 DIAGNOSIS — E1149 Type 2 diabetes mellitus with other diabetic neurological complication: Secondary | ICD-10-CM

## 2017-05-11 MED ORDER — PREGABALIN 100 MG PO CAPS: 100.0000 mg | ORAL_CAPSULE | Freq: Two times a day (BID) | ORAL | 1 refills | Status: DC

## 2017-05-19 MED FILL — ATORVASTATIN 40 MG TABLET: 40 | 30 days supply | Qty: 30 | Fill #3

## 2017-05-19 MED FILL — DULoxetine HCL 60 MG CPEP: 60 | 30 days supply | Qty: 30 | Fill #0

## 2017-05-19 MED FILL — METOPROLOL TARTRATE 50 MG T: 50 | 30 days supply | Qty: 60 | Fill #1

## 2017-05-19 MED FILL — OXYBUTYNIN 5 MG TABLET: 5 | 30 days supply | Qty: 60 | Fill #1

## 2017-05-19 MED FILL — AMITRIPTYLINE HCL 100 MG TA: 100 | 30 days supply | Qty: 30 | Fill #3

## 2017-05-19 MED FILL — metFORMIN HCL 500 MG TABS: 500 | 30 days supply | Qty: 60 | Fill #1

## 2017-05-19 MED FILL — LISINOPRIL 20 MG TAB: 20 | 30 days supply | Qty: 30 | Fill #1

## 2017-05-24 MED FILL — $Humulin 70/30 vial: (70-30) 100 | 27 days supply | Qty: 30 | Fill #1

## 2017-06-07 ENCOUNTER — Ambulatory Visit: Payer: Self-pay | Admitting: Family Medicine

## 2017-06-20 MED FILL — metFORMIN HCL 500 MG TABS: 500 | 30 days supply | Qty: 60 | Fill #2

## 2017-06-20 MED FILL — METOPROLOL TARTRATE 50 MG T: 50 | 30 days supply | Qty: 60 | Fill #2

## 2017-06-20 MED FILL — $Humulin 70/30 vial: (70-30) 100 | 27 days supply | Qty: 30 | Fill #2

## 2017-06-20 MED FILL — DULoxetine HCL 60 MG CPEP: 60 | 30 days supply | Qty: 30 | Fill #1

## 2017-06-20 MED FILL — AMITRIPTYLINE HCL 100 MG TA: 100 | 30 days supply | Qty: 30 | Fill #4

## 2017-06-20 MED FILL — OXYBUTYNIN 5 MG TABLET: 5 | 30 days supply | Qty: 60 | Fill #2

## 2017-06-20 MED FILL — LISINOPRIL 20 MG TAB: 20 | 30 days supply | Qty: 30 | Fill #2

## 2017-06-22 ENCOUNTER — Ambulatory Visit: Payer: Self-pay | Attending: Family Medicine | Admitting: Family Medicine

## 2017-06-22 ENCOUNTER — Encounter: Payer: Self-pay | Admitting: Family Medicine

## 2017-06-22 VITALS — BP 131/77 | HR 76 | Temp 97.9°F | Ht 64.0 in | Wt 200.8 lb

## 2017-06-22 DIAGNOSIS — E1149 Type 2 diabetes mellitus with other diabetic neurological complication: Secondary | ICD-10-CM

## 2017-06-22 DIAGNOSIS — G4700 Insomnia, unspecified: Secondary | ICD-10-CM | POA: Insufficient documentation

## 2017-06-22 DIAGNOSIS — Z794 Long term (current) use of insulin: Secondary | ICD-10-CM

## 2017-06-22 DIAGNOSIS — Z9049 Acquired absence of other specified parts of digestive tract: Secondary | ICD-10-CM | POA: Insufficient documentation

## 2017-06-22 DIAGNOSIS — N3281 Overactive bladder: Secondary | ICD-10-CM

## 2017-06-22 DIAGNOSIS — E1140 Type 2 diabetes mellitus with diabetic neuropathy, unspecified: Secondary | ICD-10-CM | POA: Insufficient documentation

## 2017-06-22 DIAGNOSIS — Z79899 Other long term (current) drug therapy: Secondary | ICD-10-CM | POA: Insufficient documentation

## 2017-06-22 DIAGNOSIS — E11319 Type 2 diabetes mellitus with unspecified diabetic retinopathy without macular edema: Secondary | ICD-10-CM

## 2017-06-22 DIAGNOSIS — I10 Essential (primary) hypertension: Secondary | ICD-10-CM | POA: Insufficient documentation

## 2017-06-22 DIAGNOSIS — L304 Erythema intertrigo: Secondary | ICD-10-CM

## 2017-06-22 DIAGNOSIS — Z9889 Other specified postprocedural states: Secondary | ICD-10-CM | POA: Insufficient documentation

## 2017-06-22 DIAGNOSIS — Z7982 Long term (current) use of aspirin: Secondary | ICD-10-CM | POA: Insufficient documentation

## 2017-06-22 DIAGNOSIS — Z89422 Acquired absence of other left toe(s): Secondary | ICD-10-CM | POA: Insufficient documentation

## 2017-06-22 DIAGNOSIS — F329 Major depressive disorder, single episode, unspecified: Secondary | ICD-10-CM | POA: Insufficient documentation

## 2017-06-22 LAB — GLUCOSE, POCT (MANUAL RESULT ENTRY): POC Glucose: 243 mg/dl — AB (ref 70–99)

## 2017-06-22 MED ORDER — PREGABALIN 100 MG PO CAPS: 100.0000 mg | ORAL_CAPSULE | Freq: Three times a day (TID) | ORAL | 3 refills | Status: DC

## 2017-06-22 MED ORDER — CLOTRIMAZOLE 1 % EX CREA: 1.0000 "application " | TOPICAL_CREAM | Freq: Two times a day (BID) | CUTANEOUS | 1 refills | Status: DC

## 2017-06-22 NOTE — Patient Instructions (Signed)
Intertrigo Intertrigo is skin irritation (inflammation) that happens in warm, moist areas of the body. The irritation can cause a rash and make skin raw and itchy. The rash is usually pink or red. It happens mostly between folds of skin or where skin rubs together, such as:  Toes.  Armpits.  Groin.  Belly.  Breasts.  Buttocks.  This condition is not passed from person to person (is not contagious). Follow these instructions at home:  Keep the affected area clean and dry.  Do not scratch your skin.  Stay cool as much as possible. Use an air conditioner or fan, if you can.  Apply over-the-counter and prescription medicines only as told by your doctor.  If you were prescribed an antibiotic medicine, use it as told by your doctor. Do not stop using the antibiotic even if your condition starts to get better.  Keep all follow-up visits as told by your doctor. This is important. How is this prevented?  Stay at a healthy weight.  Keep your feet dry. This is very important if you have diabetes. Wear cotton or wool socks.  Take care of and protect the skin in your groin and butt area as told by your doctor.  Do not wear tight clothes. Wear clothes that: ? Are loose. ? Take away moisture from your body. ? Are made of cotton.  Wear a bra that gives good support, if needed.  Shower and dry yourself fully after being active.  Keep your blood sugar under control if you have diabetes. Contact a doctor if:  Your symptoms do not get better with treatment.  Your symptoms get worse or they spread.  You notice more redness and warmth.  You have a fever. This information is not intended to replace advice given to you by your health care provider. Make sure you discuss any questions you have with your health care provider. Document Released: 04/03/2010 Document Revised: 08/07/2015 Document Reviewed: 09/02/2014 Elsevier Interactive Patient Education  2018 [employer] Inc.  

## 2017-06-22 NOTE — Progress Notes (Signed)
Patient has rash on lower abdomen.

## 2017-06-22 NOTE — Progress Notes (Signed)
Subjective:  Patient ID: Anna Wagner, female    DOB: 1960-10-06  Age: 57 y.o. MRN: [id number]  CC: Rash   HPI AIGNER HORSEMAN is a 57 year old female with a history of type 2 diabetes mellitus (A1c 10.2), diabetic neuropathy, hypertension, insomnia, depression, transmetatarsal amputation of the left foot who presents today for an acute visit complaining of a rash in her lower abdomen and in her thighs for the last 4 days to which she applied Vagisil with no relief in symptoms.  She describes rash as burning in nature. She also complains her diabetic neuropathy is not controlled on current dose of Lyrica. She takes oxybutynin for urinary incontinence and reports improvement in symptoms however she has noticed she sometimes passes gas through her urethra but denies hematuria, dysuria.  Past Medical History:  Diagnosis Date  . Bell's palsy 2005  . Diabetes mellitus without complication (HCC)   . Hypertension   . Neuromuscular disorder (HCC)    NEUROPATHY  . Neuropathy   . Sepsis (HCC) 02/11/2016    Past Surgical History:  Procedure Laterality Date  . AMPUTATION Left 03/15/2014   Procedure: AMPUTATION TRANSMETATARSAL;  Surgeon: Nadara Mustard, MD;  Location: [facility] OR;  Service: Orthopedics;  Laterality: Left;  . CESAREAN SECTION    . CHOLECYSTECTOMY      No Known Allergies  Outpatient Medications Prior to Visit  Medication Sig Dispense Refill  . acetaminophen (TYLENOL) 500 MG tablet Take 1,000 mg by mouth every 6 (six) hours as needed for fever.    Marland Kitchen amitriptyline (ELAVIL) 100 MG tablet Take 1 tablet (100 mg total) by mouth at bedtime. 30 tablet 5  . aspirin EC 81 MG tablet Take 81 mg by mouth at bedtime.     Marland Kitchen atorvastatin (LIPITOR) 40 MG tablet Take 1 tablet (40 mg total) by mouth daily. 30 tablet 5  . cetirizine (ZYRTEC) 10 MG tablet Take 1 tablet (10 mg total) by mouth daily. 30 tablet 1  . Cyanocobalamin (VITAMIN B-12 PO) Take 1 tablet by mouth 2 (two) times daily.    . DULoxetine  (CYMBALTA) 60 MG capsule Take 1 capsule (60 mg total) by mouth daily. 30 capsule 5  . insulin NPH-regular Human (HUMULIN 70/30) (70-30) 100 UNIT/ML injection Inject 55 units subcutaneously twice daily before breakfast and dinner 90 mL 3  . Insulin Syringe-Needle U-100 (TRUEPLUS INSULIN SYRINGE) 30G X 5/16" 1 ML MISC Use as directed 100 each 2  . lisinopril (PRINIVIL,ZESTRIL) 20 MG tablet Take 1 tablet (20 mg total) by mouth daily. 30 tablet 5  . metFORMIN (GLUCOPHAGE) 500 MG tablet TAKE 1 TABLET BY MOUTH TWICE DAILY WITH A MEAL 60 tablet 5  . metoprolol tartrate (LOPRESSOR) 50 MG tablet Take 1 tablet (50 mg total) by mouth 2 (two) times daily. 60 tablet 5  . oxybutynin (DITROPAN XL) 10 MG 24 hr tablet Take 1 tablet (10 mg total) by mouth at bedtime. 30 tablet 6  . pregabalin (LYRICA) 100 MG capsule Take 1 capsule (100 mg total) by mouth 2 (two) times daily. 180 capsule 1  . gabapentin (NEURONTIN) 600 MG tablet Take 1 tablet (600 mg total) by mouth 3 (three) times daily. Discontinue once Lyrica is available through Patient assistance program (Patient not taking: Reported on 06/22/2017) 90 tablet 1  . guaiFENesin (MUCINEX) 600 MG 12 hr tablet Take 1 tablet (600 mg total) by mouth 2 (two) times daily. (Patient not taking: Reported on 04/13/2017) 20 tablet 0   No facility-administered medications prior  to visit.     ROS Review of Systems  Constitutional: Negative for activity change, appetite change and fatigue.  HENT: Negative for congestion, sinus pressure and sore throat.   Eyes: Negative for visual disturbance.  Respiratory: Negative for cough, chest tightness, shortness of breath and wheezing.   Cardiovascular: Negative for chest pain and palpitations.  Gastrointestinal: Negative for abdominal distention, abdominal pain and constipation.  Endocrine: Negative for polydipsia.  Genitourinary: Negative for dysuria and frequency.  Musculoskeletal: Negative for arthralgias and back pain.  Skin:  Positive for rash.  Neurological: Negative for tremors, light-headedness and numbness.  Hematological: Does not bruise/bleed easily.  Psychiatric/Behavioral: Negative for agitation and behavioral problems.    Objective:  BP 131/77   Pulse 76   Temp 97.9 F (36.6 C) (Oral)   Ht 5\' 4"  (1.626 m)   Wt 200 lb 12.8 oz (91.1 kg)   SpO2 96%   BMI 34.47 kg/m   BP/Weight 06/22/2017 04/13/2017 02/25/2017  Systolic BP 131 143 167  Diastolic BP 77 83 94  Wt. (Lbs) 200.8 198.6 -  BMI 34.47 34.09 -      Physical Exam  Constitutional: She is oriented to person, place, and time. She appears well-developed and well-nourished.  Cardiovascular: Normal rate, normal heart sounds and intact distal pulses.  No murmur heard. Pulmonary/Chest: Effort normal and breath sounds normal. She has no wheezes. She has no rales. She exhibits no tenderness.  Abdominal: Soft. Bowel sounds are normal. She exhibits no distension and no mass. There is no tenderness.  Musculoskeletal: Normal range of motion.  Neurological: She is alert and oriented to person, place, and time.  Skin: Rash noted.  Erythematous rash in abdominal folds and intra-crural region    Lab Results  Component Value Date   HGBA1C 10.5 04/13/2017    Assessment & Plan:   1. Type 2 diabetes mellitus with retinopathy without macular edema, with long-term current use of insulin, unspecified laterality, unspecified retinopathy severity (HCC) Last A1c was 10.1 and 03/2017 A1c is due at next visit Continue current regimen - POCT glucose (manual entry)  2. Other diabetic neurological complication associated with type 2 diabetes mellitus (HCC) Uncontrolled Increase Lyrica from 100 mg twice daily to 100 mg 3 times daily - pregabalin (LYRICA) 100 MG capsule; Take 1 capsule (100 mg total) by mouth 3 (three) times daily.  Dispense: 90 capsule; Refill: 3  3. Intertrigo Advised that wearing loose clothing, weight loss will help symptoms -  clotrimazole (LOTRIMIN) 1 % cream; Apply 1 application topically 2 (two) times daily.  Dispense: 113 g; Refill: 1  4. Overactive bladder Continue oxybutynin   Meds ordered this encounter  Medications  . pregabalin (LYRICA) 100 MG capsule    Sig: Take 1 capsule (100 mg total) by mouth 3 (three) times daily.    Dispense:  90 capsule    Refill:  3    Discontinue previous dose  . clotrimazole (LOTRIMIN) 1 % cream    Sig: Apply 1 application topically 2 (two) times daily.    Dispense:  113 g    Refill:  1    Follow-up: Return for Follow-up of chronic medical conditions, keep previously scheduled appointment.   Hoy RegisterEnobong Braeton Wolgamott MD

## 2017-07-06 ENCOUNTER — Encounter: Payer: Self-pay | Admitting: Family Medicine

## 2017-07-06 ENCOUNTER — Telehealth: Payer: Self-pay | Admitting: *Deleted

## 2017-07-06 ENCOUNTER — Ambulatory Visit: Payer: Self-pay | Attending: Family Medicine | Admitting: Family Medicine

## 2017-07-06 VITALS — BP 130/82 | HR 82 | Temp 97.6°F | Ht 64.0 in | Wt 203.0 lb

## 2017-07-06 DIAGNOSIS — Z9889 Other specified postprocedural states: Secondary | ICD-10-CM | POA: Insufficient documentation

## 2017-07-06 DIAGNOSIS — T25021A Burn of unspecified degree of right foot, initial encounter: Secondary | ICD-10-CM | POA: Insufficient documentation

## 2017-07-06 DIAGNOSIS — X58XXXA Exposure to other specified factors, initial encounter: Secondary | ICD-10-CM | POA: Insufficient documentation

## 2017-07-06 DIAGNOSIS — Z794 Long term (current) use of insulin: Secondary | ICD-10-CM | POA: Insufficient documentation

## 2017-07-06 DIAGNOSIS — Z9049 Acquired absence of other specified parts of digestive tract: Secondary | ICD-10-CM | POA: Insufficient documentation

## 2017-07-06 DIAGNOSIS — T25221A Burn of second degree of right foot, initial encounter: Secondary | ICD-10-CM

## 2017-07-06 DIAGNOSIS — T25222A Burn of second degree of left foot, initial encounter: Secondary | ICD-10-CM

## 2017-07-06 DIAGNOSIS — Z79899 Other long term (current) drug therapy: Secondary | ICD-10-CM | POA: Insufficient documentation

## 2017-07-06 DIAGNOSIS — T25022A Burn of unspecified degree of left foot, initial encounter: Secondary | ICD-10-CM | POA: Insufficient documentation

## 2017-07-06 DIAGNOSIS — E1100 Type 2 diabetes mellitus with hyperosmolarity without nonketotic hyperglycemic-hyperosmolar coma (NKHHC): Secondary | ICD-10-CM

## 2017-07-06 DIAGNOSIS — Z7982 Long term (current) use of aspirin: Secondary | ICD-10-CM | POA: Insufficient documentation

## 2017-07-06 LAB — GLUCOSE, POCT (MANUAL RESULT ENTRY): POC Glucose: 184 mg/dl — AB (ref 70–99)

## 2017-07-06 MED ORDER — SILVER SULFADIAZINE 1 % EX CREA: 1.0000 "application " | TOPICAL_CREAM | Freq: Every day | CUTANEOUS | 1 refills | Status: DC

## 2017-07-06 MED FILL — SSD 1% CREAM: 1 | 30 days supply | Qty: 50 | Fill #0

## 2017-07-06 NOTE — Progress Notes (Signed)
Patient has burn on both heels.

## 2017-07-06 NOTE — Telephone Encounter (Addendum)
Mrs. Anna Wagner came to for advise on feet. She has open blisters at the posterior aspect of both feet. She has diabetes and is very concerned. She had previous amputation on left foot around 2 years ago. Will make a same day appointment with on Dr. Baxter FlatteryNewlin's schedule today.

## 2017-07-06 NOTE — Progress Notes (Signed)
Subjective:  Patient ID: Anna Wagner, female    DOB: [birth year]  Age: 57 y.o. MRN: [id number]  CC: Foot Burn and Diabetes   [name] is a 57 year old female with a history of type 2 diabetes mellitus (A1c 10.5), diabetic neuropathy, hypertension, insomnia, depression, transmetatarsal amputation of the left foot who presents for an acute visit complaining of a burn which states she sustained on both heels while sitting on her patio. She had no shoes or socks and described the patio furniture as hot but due to her neuropathy she was unable to feel any sensations in her feet as she moved her feet while she sat outside.  On getting up she discovered she was bleeding from both heels and has not applied any topical creams to the lesions.  Past Medical History:  Diagnosis Date  . Bell's palsy 2005  . Diabetes mellitus without complication (HCC)   . Hypertension   . Neuromuscular disorder (HCC)    NEUROPATHY  . Neuropathy   . Sepsis (HCC) 02/11/2016    Past Surgical History:  Procedure Laterality Date  . AMPUTATION Left 03/15/2014   Procedure: AMPUTATION TRANSMETATARSAL;  Surgeon: Nadara MustardMarcus Duda V, MD;  [facility] OR;  Service: Orthopedics;  Laterality: Left;  . CESAREAN SECTION    . CHOLECYSTECTOMY      No Known Allergies   Outpatient Medications Prior to Visit  Medication Sig Dispense Refill  . acetaminophen (TYLENOL) 500 MG tablet Take 1,000 mg by mouth every 6 (six) hours as needed for fever.    Marland Kitchen. amitriptyline (ELAVIL) 100 MG tablet Take 1 tablet (100 mg total) by mouth at bedtime. 30 tablet 5  . aspirin EC 81 MG tablet Take 81 mg by mouth at bedtime.     Marland Kitchen. atorvastatin (LIPITOR) 40 MG tablet Take 1 tablet (40 mg total) by mouth daily. 30 tablet 5  . cetirizine (ZYRTEC) 10 MG tablet Take 1 tablet (10 mg total) by mouth daily. 30 tablet 1  . clotrimazole (LOTRIMIN) 1 % cream Apply 1 application topically 2 (two) times daily. 113 g 1  . Cyanocobalamin (VITAMIN B-12 PO)  Take 1 tablet by mouth 2 (two) times daily.    . DULoxetine (CYMBALTA) 60 MG capsule Take 1 capsule (60 mg total) by mouth daily. 30 capsule 5  . insulin NPH-regular Human (HUMULIN 70/30) (70-30) 100 UNIT/ML injection Inject 55 units subcutaneously twice daily before breakfast and dinner 90 mL 3  . Insulin Syringe-Needle U-100 (TRUEPLUS INSULIN SYRINGE) 30G X 5/16" 1 ML MISC Use as directed 100 each 2  . lisinopril (PRINIVIL,ZESTRIL) 20 MG tablet Take 1 tablet (20 mg total) by mouth daily. 30 tablet 5  . metFORMIN (GLUCOPHAGE) 500 MG tablet TAKE 1 TABLET BY MOUTH TWICE DAILY WITH A MEAL 60 tablet 5  . metoprolol tartrate (LOPRESSOR) 50 MG tablet Take 1 tablet (50 mg total) by mouth 2 (two) times daily. 60 tablet 5  . oxybutynin (DITROPAN XL) 10 MG 24 hr tablet Take 1 tablet (10 mg total) by mouth at bedtime. 30 tablet 6  . pregabalin (LYRICA) 100 MG capsule Take 1 capsule (100 mg total) by mouth 3 (three) times daily. 90 capsule 3  . gabapentin (NEURONTIN) 600 MG tablet Take 1 tablet (600 mg total) by mouth 3 (three) times daily. Discontinue once Lyrica is available through Patient assistance program (Patient not taking: Reported on 06/22/2017) 90 tablet 1  . guaiFENesin (MUCINEX) 600 MG 12 hr tablet Take 1 tablet (600 mg total)  by mouth 2 (two) times daily. (Patient not taking: Reported on 04/13/2017) 20 tablet 0   No facility-administered medications prior to visit.     ROS Review of Systems  Constitutional: Negative for activity change, appetite change and fatigue.  HENT: Negative for congestion, sinus pressure and sore throat.   Eyes: Negative for visual disturbance.  Respiratory: Negative for cough, chest tightness, shortness of breath and wheezing.   Cardiovascular: Negative for chest pain and palpitations.  Gastrointestinal: Negative for abdominal distention, abdominal pain and constipation.  Endocrine: Negative for polydipsia.  Genitourinary: Negative for dysuria and frequency.    Musculoskeletal: Negative for arthralgias and back pain.  Skin:       See hpi  Neurological: Positive for numbness. Negative for tremors and light-headedness.  Hematological: Does not bruise/bleed easily.  Psychiatric/Behavioral: Negative for agitation and behavioral problems.    Objective:   Vitals:   07/07/17 0854  BP: 130/82  Pulse: 82  Temp: 97.6 F (36.4 C)  TempSrc: Oral  SpO2: 95%  Weight: 203 lb (92.1 kg)  Height: 5\' 4"  (1.626 m)       Physical Exam  Constitutional: She is oriented to person, place, and time. She appears well-developed and well-nourished.  Cardiovascular: Normal rate, normal heart sounds and intact distal pulses.  No murmur heard. Pulmonary/Chest: Effort normal and breath sounds normal. She has no wheezes. She has no rales. She exhibits no tenderness.  Abdominal: Soft. Bowel sounds are normal. She exhibits no distension and no mass. There is no tenderness.  Musculoskeletal: Normal range of motion.  Neurological: She is alert and oriented to person, place, and time.  Skin:  See image below        Assessment & Plan:   1. Type 2 diabetes mellitus with hyperosmolarity without coma, without long-term current use of insulin (HCC) Controlled with A1c of 10.5 Diabetic visit on 07/13/2017 at which time she is due for an A1c. - POCT glucose (manual entry)  2. Partial thickness burn of left foot, initial encounter Dressing applied in clinic - silver sulfADIAZINE (SILVADENE) 1 % cream; Apply 1 application topically daily.  Dispense: 50 g; Refill: 1  3. Partial thickness burn of right foot, initial encounter - silver sulfADIAZINE (SILVADENE) 1 % cream; Apply 1 application topically daily.  Dispense: 50 g; Refill: 1   Meds ordered this encounter  Medications  . silver sulfADIAZINE (SILVADENE) 1 % cream    Sig: Apply 1 application topically daily.    Dispense:  50 g    Refill:  1    Follow-up: Return for Follow-up of chronic medical  conditions, keep previously scheduled appointment.   Hoy Register MD

## 2017-07-13 ENCOUNTER — Ambulatory Visit: Payer: Self-pay | Attending: Family Medicine | Admitting: Family Medicine

## 2017-07-13 VITALS — BP 137/78 | HR 71 | Temp 97.6°F | Wt 200.2 lb

## 2017-07-13 DIAGNOSIS — Z89432 Acquired absence of left foot: Secondary | ICD-10-CM | POA: Insufficient documentation

## 2017-07-13 DIAGNOSIS — T25129D Burn of first degree of unspecified foot, subsequent encounter: Secondary | ICD-10-CM

## 2017-07-13 DIAGNOSIS — T25221S Burn of second degree of right foot, sequela: Secondary | ICD-10-CM

## 2017-07-13 DIAGNOSIS — E1140 Type 2 diabetes mellitus with diabetic neuropathy, unspecified: Secondary | ICD-10-CM | POA: Insufficient documentation

## 2017-07-13 DIAGNOSIS — E1149 Type 2 diabetes mellitus with other diabetic neurological complication: Secondary | ICD-10-CM

## 2017-07-13 DIAGNOSIS — T25222S Burn of second degree of left foot, sequela: Secondary | ICD-10-CM

## 2017-07-13 DIAGNOSIS — Z794 Long term (current) use of insulin: Secondary | ICD-10-CM | POA: Insufficient documentation

## 2017-07-13 DIAGNOSIS — Z7982 Long term (current) use of aspirin: Secondary | ICD-10-CM | POA: Insufficient documentation

## 2017-07-13 DIAGNOSIS — E1100 Type 2 diabetes mellitus with hyperosmolarity without nonketotic hyperglycemic-hyperosmolar coma (NKHHC): Secondary | ICD-10-CM | POA: Insufficient documentation

## 2017-07-13 DIAGNOSIS — F329 Major depressive disorder, single episode, unspecified: Secondary | ICD-10-CM | POA: Insufficient documentation

## 2017-07-13 DIAGNOSIS — G4700 Insomnia, unspecified: Secondary | ICD-10-CM | POA: Insufficient documentation

## 2017-07-13 DIAGNOSIS — I10 Essential (primary) hypertension: Secondary | ICD-10-CM | POA: Insufficient documentation

## 2017-07-13 DIAGNOSIS — L089 Local infection of the skin and subcutaneous tissue, unspecified: Secondary | ICD-10-CM | POA: Insufficient documentation

## 2017-07-13 DIAGNOSIS — R600 Localized edema: Secondary | ICD-10-CM | POA: Insufficient documentation

## 2017-07-13 DIAGNOSIS — Z79899 Other long term (current) drug therapy: Secondary | ICD-10-CM | POA: Insufficient documentation

## 2017-07-13 LAB — POCT GLYCOSYLATED HEMOGLOBIN (HGB A1C): Hemoglobin A1C: 10.1

## 2017-07-13 LAB — GLUCOSE, POCT (MANUAL RESULT ENTRY): POC GLUCOSE: 110 mg/dL — AB (ref 70–99)

## 2017-07-13 MED ORDER — CEPHALEXIN 500 MG PO CAPS: 500.0000 mg | ORAL_CAPSULE | Freq: Two times a day (BID) | ORAL | 0 refills | Status: DC

## 2017-07-13 MED FILL — CEPHALEXIN 500 MG CAPSULE: 500 | 7 days supply | Qty: 14 | Fill #0

## 2017-07-13 NOTE — Progress Notes (Signed)
Subjective:  Patient ID: Anna Wagner, female    DOB: [birth year]  Age: 57 y.o. MRN: [id number]  CC: Diabetes   HPI She is a 57 year old female with a history of type 2 diabetes mellitus (A1c 10.1), diabetic neuropathy, hypertension, insomnia, depression, transmetatarsal amputation of the left foot who presents for a follow-up visit. At her last visit 1 week ago she was seen for a friction burn to both heels and placed on Silvadene cream however the lesion has not improved much and she has noticed some swelling in both feet and her shampoo bottle fell on her right big toe 2 weeks ago with associated change in color of her Rbig toenail and some bluish discoloration. She denies fever. Her neuropathy is uncontrolled despite increasing Lyrica from 100 mg twice daily to 100 mg 3 times daily and she takes this in addition to Cymbalta.  Her blood sugars have been in the 110 range in the last 2 days she states but her A1c is 10.1 today.  She endorses noncompliance with a diabetic diet and maintaining dietary indiscretion but states the last few days she has cut out carbs and sweets. She tolerates antihypertensive with no adverse effects and is doing well on her antidepressant.  Past Medical History:  Diagnosis Date  . Bell's palsy 2005  . Diabetes mellitus without complication (HCC)   . Hypertension   . Neuromuscular disorder (HCC)    NEUROPATHY  . Neuropathy   . Sepsis (HCC) 02/11/2016    Past Surgical History:  Procedure Laterality Date  . AMPUTATION Left 03/15/2014   Procedure: AMPUTATION TRANSMETATARSAL;  Surgeon: Nadara Mustard, MD;  Location: [facility] OR;  Service: Orthopedics;  Laterality: Left;  . CESAREAN SECTION    . CHOLECYSTECTOMY      No Known Allergies   Outpatient Medications Prior to Visit  Medication Sig Dispense Refill  . acetaminophen (TYLENOL) 500 MG tablet Take 1,000 mg by mouth every 6 (six) hours as needed for fever.    Marland Kitchen amitriptyline (ELAVIL) 100 MG tablet Take 1  tablet (100 mg total) by mouth at bedtime. 30 tablet 5  . aspirin EC 81 MG tablet Take 81 mg by mouth at bedtime.     Marland Kitchen atorvastatin (LIPITOR) 40 MG tablet Take 1 tablet (40 mg total) by mouth daily. 30 tablet 5  . cetirizine (ZYRTEC) 10 MG tablet Take 1 tablet (10 mg total) by mouth daily. 30 tablet 1  . clotrimazole (LOTRIMIN) 1 % cream Apply 1 application topically 2 (two) times daily. 113 g 1  . Cyanocobalamin (VITAMIN B-12 PO) Take 1 tablet by mouth 2 (two) times daily.    . DULoxetine (CYMBALTA) 60 MG capsule Take 1 capsule (60 mg total) by mouth daily. 30 capsule 5  . insulin NPH-regular Human (HUMULIN 70/30) (70-30) 100 UNIT/ML injection Inject 55 units subcutaneously twice daily before breakfast and dinner 90 mL 3  . Insulin Syringe-Needle U-100 (TRUEPLUS INSULIN SYRINGE) 30G X 5/16" 1 ML MISC Use as directed 100 each 2  . lisinopril (PRINIVIL,ZESTRIL) 20 MG tablet Take 1 tablet (20 mg total) by mouth daily. 30 tablet 5  . metFORMIN (GLUCOPHAGE) 500 MG tablet TAKE 1 TABLET BY MOUTH TWICE DAILY WITH A MEAL 60 tablet 5  . metoprolol tartrate (LOPRESSOR) 50 MG tablet Take 1 tablet (50 mg total) by mouth 2 (two) times daily. 60 tablet 5  . oxybutynin (DITROPAN XL) 10 MG 24 hr tablet Take 1 tablet (10 mg total) by mouth at bedtime. 30 tablet  6  . pregabalin (LYRICA) 100 MG capsule Take 1 capsule (100 mg total) by mouth 3 (three) times daily. 90 capsule 3  . silver sulfADIAZINE (SILVADENE) 1 % cream Apply 1 application topically daily. 50 g 1  . gabapentin (NEURONTIN) 600 MG tablet Take 1 tablet (600 mg total) by mouth 3 (three) times daily. Discontinue once Lyrica is available through Patient assistance program (Patient not taking: Reported on 06/22/2017) 90 tablet 1  . guaiFENesin (MUCINEX) 600 MG 12 hr tablet Take 1 tablet (600 mg total) by mouth 2 (two) times daily. (Patient not taking: Reported on 04/13/2017) 20 tablet 0   No facility-administered medications prior to visit.      ROS Review of Systems  Constitutional: Negative for activity change, appetite change and fatigue.  HENT: Negative for congestion, sinus pressure and sore throat.   Eyes: Negative for visual disturbance.  Respiratory: Negative for cough, chest tightness, shortness of breath and wheezing.   Cardiovascular: Negative for chest pain and palpitations.  Gastrointestinal: Negative for abdominal distention, abdominal pain and constipation.  Endocrine: Negative for polydipsia.  Genitourinary: Negative for dysuria and frequency.  Musculoskeletal: Negative for arthralgias and back pain.  Skin: Negative for rash.  Neurological: Negative for tremors, light-headedness and numbness.  Hematological: Does not bruise/bleed easily.  Psychiatric/Behavioral: Negative for agitation and behavioral problems.    Objective:  BP 137/78   Pulse 71   Temp 97.6 F (36.4 C) (Oral)   Wt 200 lb 3.2 oz (90.8 kg)   SpO2 95%   BMI 34.36 kg/m   BP/Weight 07/13/2017 07/06/2017 06/22/2017  Systolic BP 137 130 131  Diastolic BP 78 82 77  Wt. (Lbs) 200.2 203 200.8  BMI 34.36 34.84 34.47      Physical Exam  Constitutional: She is oriented to person, place, and time. She appears well-developed and well-nourished.  Cardiovascular: Normal rate, normal heart sounds and intact distal pulses.  No murmur heard. Pulmonary/Chest: Effort normal and breath sounds normal. She has no wheezes. She has no rales. She exhibits no tenderness.  Abdominal: Soft. Bowel sounds are normal. She exhibits no distension and no mass. There is no tenderness.  Musculoskeletal: Normal range of motion. She exhibits edema (1+ non pitting pedal edema b/l).  Left foot transmetatarsal amputation  Neurological: She is alert and oriented to person, place, and time.  Skin:  Slight purplish discoloration of the lateral aspect of right big toe; Yellowish discoloration of superior half of R big toenail  Psychiatric: She has a normal mood and affect.         Lab Results  Component Value Date   HGBA1C 10.1 07/13/2017    Assessment & Plan:   1. Type 2 diabetes mellitus with hyperosmolarity without coma, without long-term current use of insulin (HCC) Uncontrolled with A1c of 10.1 due to dietary indiscretion Blood sugars have been controlled lately; I will not increase her insulin today to prevent hypoglycemia We will review blood sugar log at next visit and increased dose of insulin if still elevated Encouraged to comply with a diabetic diet, lifestyle modifications - POCT glucose (manual entry) - POCT glycosylated hemoglobin (Hb A1C) - Ambulatory referral to Podiatry  2. Other diabetic neurological complication associated with type 2 diabetes mellitus (HCC) Uncontrolled on Lyrica - Ambulatory referral to Neurology - Ambulatory referral to Podiatry  3. Essential hypertension Controlled Low-sodium diet  4. Pedal edema Could be secondary to impending cellulitis Elevate feet  5. Superficial burn of foot, unspecified laterality, subsequent encounter Secondary to friction  burn Continue Silvadene dressing Recommend antibiotic as she is high risk - cephALEXin (KEFLEX) 500 MG capsule; Take 1 capsule (500 mg total) by mouth 2 (two) times daily.  Dispense: 14 capsule; Refill: 0  6. Toe infection She has had a nail damage from traum, will cover with antibiotics to prevent superinfectiona Referred to podiatry as she has had a left transmetatarsal amputation - cephALEXin (KEFLEX) 500 MG capsule; Take 1 capsule (500 mg total) by mouth 2 (two) times daily.  Dispense: 14 capsule; Refill: 0   Meds ordered this encounter  Medications  . cephALEXin (KEFLEX) 500 MG capsule    Sig: Take 1 capsule (500 mg total) by mouth 2 (two) times daily.    Dispense:  14 capsule    Refill:  0    Follow-up: Return in about 2 weeks (around 07/27/2017) for Follow-up of burn on feet.   Hoy Register MD

## 2017-07-14 ENCOUNTER — Encounter: Payer: Self-pay | Admitting: Family Medicine

## 2017-07-20 ENCOUNTER — Other Ambulatory Visit: Payer: Self-pay

## 2017-07-20 ENCOUNTER — Telehealth: Payer: Self-pay | Admitting: Family Medicine

## 2017-07-20 DIAGNOSIS — E1149 Type 2 diabetes mellitus with other diabetic neurological complication: Secondary | ICD-10-CM

## 2017-07-20 MED ORDER — PREGABALIN 100 MG PO CAPS: 100.0000 mg | ORAL_CAPSULE | Freq: Three times a day (TID) | ORAL | 1 refills | Status: DC

## 2017-07-20 NOTE — Telephone Encounter (Signed)
Patient would like you to call her back because lyrica isn't work. Please call patient at [phone]

## 2017-07-20 NOTE — Telephone Encounter (Signed)
Patient was called and a voicemail  Was left informing patient to contact office once message is received.

## 2017-07-20 NOTE — Telephone Encounter (Signed)
I had referred her to neurology at her last visit because of her uncontrolled neuropathy.  She is currently on huge doses of Lyrica, Cymbalta and she can take an NSAID in addition. Please ensure that she has the [employer] discount to facilitate this referral.

## 2017-07-21 ENCOUNTER — Other Ambulatory Visit: Payer: Self-pay | Admitting: Family Medicine

## 2017-07-21 DIAGNOSIS — T25221A Burn of second degree of right foot, initial encounter: Secondary | ICD-10-CM

## 2017-07-21 DIAGNOSIS — T25222A Burn of second degree of left foot, initial encounter: Secondary | ICD-10-CM

## 2017-07-21 MED ORDER — SILVER SULFADIAZINE 1 % EX CREA: 1.0000 "application " | TOPICAL_CREAM | Freq: Two times a day (BID) | CUTANEOUS | 1 refills | Status: DC

## 2017-07-21 MED FILL — SILVADENE 1% CREAM: 1 | 30 days supply | Qty: 85 | Fill #0

## 2017-07-25 MED FILL — ATORVASTATIN CALCIUM 40 MG: 40 | 30 days supply | Qty: 30 | Fill #0

## 2017-07-25 MED FILL — AMITRIPTYLINE HCL 100 MG TA: 100 | 30 days supply | Qty: 30 | Fill #5

## 2017-07-25 MED FILL — OXYBUTYNIN 5 MG TABLET: 5 | 30 days supply | Qty: 60 | Fill #3

## 2017-07-25 MED FILL — METOPROLOL TARTRATE 50 MG T: 50 | 30 days supply | Qty: 60 | Fill #3

## 2017-07-25 MED FILL — $Humulin 70/30 vial: (70-30) 100 | 27 days supply | Qty: 30 | Fill #3

## 2017-07-25 MED FILL — metFORMIN HCL 500 MG TABS: 500 | 30 days supply | Qty: 60 | Fill #3

## 2017-07-25 MED FILL — DULoxetine HCL 60 MG CPEP: 60 | 30 days supply | Qty: 30 | Fill #2

## 2017-07-25 MED FILL — LISINOPRIL 20 MG TAB: 20 | 30 days supply | Qty: 30 | Fill #3

## 2017-07-27 ENCOUNTER — Ambulatory Visit: Payer: Self-pay | Admitting: Family Medicine

## 2017-08-03 ENCOUNTER — Other Ambulatory Visit: Payer: Self-pay | Admitting: Family Medicine

## 2017-08-03 NOTE — Telephone Encounter (Signed)
Patient called requesting medication refill cephALEXin (KEFLEX) 500 MG   for foot infection. Patient is stating that infection is not getting any better. She uses [facility] - [place], [place] - [address] Please follow up [phone]

## 2017-08-03 NOTE — Telephone Encounter (Signed)
I will suggest presenting to the emergency room given she has tried antibiotics it is worsening and she is at high risk of amputation as the ulcers have been present for close to a month.

## 2017-08-10 MED FILL — GABAPENTIN 600 MG TABLET: 600 | 30 days supply | Qty: 90 | Fill #0

## 2017-08-10 NOTE — Telephone Encounter (Signed)
Patient came to the  facility to let her PCP know that her feet are doing much better.

## 2017-08-11 NOTE — Telephone Encounter (Signed)
Will route to PCP 

## 2017-08-12 NOTE — Telephone Encounter (Signed)
Noted  

## 2017-08-16 ENCOUNTER — Ambulatory Visit: Payer: Self-pay

## 2017-08-22 MED FILL — $Humulin 70/30 vial: (70-30) 100 | 30 days supply | Qty: 30 | Fill #4

## 2017-08-24 ENCOUNTER — Ambulatory Visit: Payer: Self-pay | Attending: Family Medicine | Admitting: Family Medicine

## 2017-08-24 ENCOUNTER — Encounter: Payer: Self-pay | Admitting: Family Medicine

## 2017-08-24 VITALS — BP 164/82 | HR 80 | Temp 97.2°F | Ht 64.0 in | Wt 202.6 lb

## 2017-08-24 DIAGNOSIS — N393 Stress incontinence (female) (male): Secondary | ICD-10-CM | POA: Insufficient documentation

## 2017-08-24 DIAGNOSIS — I10 Essential (primary) hypertension: Secondary | ICD-10-CM | POA: Insufficient documentation

## 2017-08-24 DIAGNOSIS — E1149 Type 2 diabetes mellitus with other diabetic neurological complication: Secondary | ICD-10-CM

## 2017-08-24 DIAGNOSIS — T25129D Burn of first degree of unspecified foot, subsequent encounter: Secondary | ICD-10-CM

## 2017-08-24 DIAGNOSIS — R600 Localized edema: Secondary | ICD-10-CM | POA: Insufficient documentation

## 2017-08-24 DIAGNOSIS — G4709 Other insomnia: Secondary | ICD-10-CM

## 2017-08-24 DIAGNOSIS — E11319 Type 2 diabetes mellitus with unspecified diabetic retinopathy without macular edema: Secondary | ICD-10-CM | POA: Insufficient documentation

## 2017-08-24 DIAGNOSIS — E1100 Type 2 diabetes mellitus with hyperosmolarity without nonketotic hyperglycemic-hyperosmolar coma (NKHHC): Secondary | ICD-10-CM | POA: Insufficient documentation

## 2017-08-24 DIAGNOSIS — Z79899 Other long term (current) drug therapy: Secondary | ICD-10-CM | POA: Insufficient documentation

## 2017-08-24 DIAGNOSIS — G4700 Insomnia, unspecified: Secondary | ICD-10-CM | POA: Insufficient documentation

## 2017-08-24 DIAGNOSIS — F329 Major depressive disorder, single episode, unspecified: Secondary | ICD-10-CM | POA: Insufficient documentation

## 2017-08-24 DIAGNOSIS — E1140 Type 2 diabetes mellitus with diabetic neuropathy, unspecified: Secondary | ICD-10-CM | POA: Insufficient documentation

## 2017-08-24 DIAGNOSIS — T25029A Burn of unspecified degree of unspecified foot, initial encounter: Secondary | ICD-10-CM | POA: Insufficient documentation

## 2017-08-24 DIAGNOSIS — Z7982 Long term (current) use of aspirin: Secondary | ICD-10-CM | POA: Insufficient documentation

## 2017-08-24 DIAGNOSIS — Z794 Long term (current) use of insulin: Secondary | ICD-10-CM | POA: Insufficient documentation

## 2017-08-24 LAB — GLUCOSE, POCT (MANUAL RESULT ENTRY): POC Glucose: 238 mg/dl — AB (ref 70–99)

## 2017-08-24 MED ORDER — LISINOPRIL 20 MG PO TABS: 20.0000 mg | ORAL_TABLET | Freq: Every day | ORAL | 5 refills | Status: DC

## 2017-08-24 MED ORDER — OXYBUTYNIN CHLORIDE ER 10 MG PO TB24: 10.0000 mg | ORAL_TABLET | Freq: Every day | ORAL | 6 refills | Status: DC

## 2017-08-24 MED ORDER — AMITRIPTYLINE HCL 100 MG PO TABS: 100.0000 mg | ORAL_TABLET | Freq: Every day | ORAL | 5 refills | Status: DC

## 2017-08-24 MED ORDER — METFORMIN HCL 1000 MG PO TABS: ORAL_TABLET | ORAL | 6 refills | Status: DC

## 2017-08-24 MED ORDER — DULOXETINE HCL 60 MG PO CPEP: 60.0000 mg | ORAL_CAPSULE | Freq: Every day | ORAL | 5 refills | Status: DC

## 2017-08-24 MED ORDER — CEPHALEXIN 500 MG PO CAPS: 500.0000 mg | ORAL_CAPSULE | Freq: Two times a day (BID) | ORAL | 0 refills | Status: DC

## 2017-08-24 MED ORDER — ATORVASTATIN CALCIUM 40 MG PO TABS: 40.0000 mg | ORAL_TABLET | Freq: Every day | ORAL | 5 refills | Status: DC

## 2017-08-24 MED ORDER — FUROSEMIDE 20 MG PO TABS: 20.0000 mg | ORAL_TABLET | Freq: Every day | ORAL | 0 refills | Status: DC

## 2017-08-24 MED ORDER — METOPROLOL TARTRATE 50 MG PO TABS: 50.0000 mg | ORAL_TABLET | Freq: Two times a day (BID) | ORAL | 5 refills | Status: DC

## 2017-08-24 MED ORDER — INSULIN NPH ISOPHANE & REGULAR (70-30) 100 UNIT/ML ~~LOC~~ SUSP: SUBCUTANEOUS | 6 refills | Status: DC

## 2017-08-24 MED ORDER — SILVER SULFADIAZINE 1 % EX CREA: 1.0000 "application " | TOPICAL_CREAM | Freq: Two times a day (BID) | CUTANEOUS | 1 refills | Status: DC

## 2017-08-24 MED FILL — AMITRIPTYLINE HCL 100 MG TA: 100 | 30 days supply | Qty: 30 | Fill #0

## 2017-08-24 MED FILL — metFORMIN HCL 1000 MG TABS: 1000 | 30 days supply | Qty: 60 | Fill #0

## 2017-08-24 MED FILL — ATORVASTATIN CALCIUM 40 MG: 40 | 30 days supply | Qty: 30 | Fill #1

## 2017-08-24 MED FILL — LISINOPRIL 20 MG TAB: 20 | 30 days supply | Qty: 30 | Fill #4

## 2017-08-24 MED FILL — CEPHALEXIN 500 MG CAPSULE: 500 | 7 days supply | Qty: 14 | Fill #0

## 2017-08-24 MED FILL — METOPROLOL TARTRATE 50 MG T: 50 | 30 days supply | Qty: 60 | Fill #4

## 2017-08-24 MED FILL — DULoxetine HCL 60 MG CPEP: 60 | 30 days supply | Qty: 30 | Fill #3

## 2017-08-24 MED FILL — FUROSEMIDE 20 MG TABLET: 20 | 30 days supply | Qty: 30 | Fill #0

## 2017-08-24 MED FILL — OXYBUTYNIN CL ER 10 MG TAB: 10 | 30 days supply | Qty: 30 | Fill #0

## 2017-08-24 MED FILL — SILVADENE 1% CREAM: 1 | 20 days supply | Qty: 85 | Fill #0

## 2017-08-24 NOTE — Patient Instructions (Signed)

## 2017-08-24 NOTE — Progress Notes (Signed)
Subjective:  Patient ID: Anna Wagner, female    DOB: 11/22/1960  Age: 57 y.o. MRN: [id number]  CC: Diabetes   [name] is a 57 year old female with a history of type 2 diabetes mellitus (A1c 10.1), diabetic neuropathy, hypertension, insomnia, depression, transmetatarsal amputation of the left foot who presents for a follow-up visit. She has a bilateral heel burn for which she had been seen at previous visits which is improving but she is wondering if she needs additional antibiotics; denies discharge from ulcers but complains of right ankle edema, no fevers or calf pain. She endorses compliance with her insulin and metformin and informs me her fasting sugars are usually in the 200s. Her diabetic neuropathy is uncontrolled on Cymbalta and Gabapentin and she had been referred to Neurology. Her blood pressure is elevated and she endorses taking her medications. She is doing well on Oxybutynin which she takes for stress incontinence  Past Medical History:  Diagnosis Date  . Bell's palsy 2005  . Diabetes mellitus without complication (HCC)   . Hypertension   . Neuromuscular disorder (HCC)    NEUROPATHY  . Neuropathy   . Sepsis (HCC) 02/11/2016    Past Surgical History:  Procedure Laterality Date  . AMPUTATION Left 03/15/2014   Procedure: AMPUTATION TRANSMETATARSAL;  Surgeon: Nadara Mustard, MD;  Location: [facility] OR;  Service: Orthopedics;  Laterality: Left;  . CESAREAN SECTION    . CHOLECYSTECTOMY      No Known Allergies   Outpatient Medications Prior to Visit  Medication Sig Dispense Refill  . acetaminophen (TYLENOL) 500 MG tablet Take 1,000 mg by mouth every 6 (six) hours as needed for fever.    Marland Kitchen aspirin EC 81 MG tablet Take 81 mg by mouth at bedtime.     . Cyanocobalamin (VITAMIN B-12 PO) Take 1 tablet by mouth 2 (two) times daily.    . Insulin Syringe-Needle U-100 (TRUEPLUS INSULIN SYRINGE) 30G X 5/16" 1 ML MISC Use as directed 100 each 2  . pregabalin (LYRICA) 100 MG  capsule Take 1 capsule (100 mg total) by mouth 3 (three) times daily. 270 capsule 1  . amitriptyline (ELAVIL) 100 MG tablet Take 1 tablet (100 mg total) by mouth at bedtime. 30 tablet 5  . atorvastatin (LIPITOR) 40 MG tablet Take 1 tablet (40 mg total) by mouth daily. 30 tablet 5  . DULoxetine (CYMBALTA) 60 MG capsule Take 1 capsule (60 mg total) by mouth daily. 30 capsule 5  . insulin NPH-regular Human (HUMULIN 70/30) (70-30) 100 UNIT/ML injection Inject 55 units subcutaneously twice daily before breakfast and dinner 90 mL 3  . lisinopril (PRINIVIL,ZESTRIL) 20 MG tablet Take 1 tablet (20 mg total) by mouth daily. 30 tablet 5  . metFORMIN (GLUCOPHAGE) 500 MG tablet TAKE 1 TABLET BY MOUTH TWICE DAILY WITH A MEAL 60 tablet 5  . metoprolol tartrate (LOPRESSOR) 50 MG tablet Take 1 tablet (50 mg total) by mouth 2 (two) times daily. 60 tablet 5  . oxybutynin (DITROPAN XL) 10 MG 24 hr tablet Take 1 tablet (10 mg total) by mouth at bedtime. 30 tablet 6  . silver sulfADIAZINE (SILVADENE) 1 % cream Apply 1 application topically 2 (two) times daily. 85 g 1  . cetirizine (ZYRTEC) 10 MG tablet Take 1 tablet (10 mg total) by mouth daily. (Patient not taking: Reported on 08/24/2017) 30 tablet 1  . clotrimazole (LOTRIMIN) 1 % cream Apply 1 application topically 2 (two) times daily. (Patient not taking: Reported on 08/24/2017) 113 g  1  . guaiFENesin (MUCINEX) 600 MG 12 hr tablet Take 1 tablet (600 mg total) by mouth 2 (two) times daily. (Patient not taking: Reported on 04/13/2017) 20 tablet 0  . cephALEXin (KEFLEX) 500 MG capsule Take 1 capsule (500 mg total) by mouth 2 (two) times daily. (Patient not taking: Reported on 08/24/2017) 14 capsule 0  . gabapentin (NEURONTIN) 600 MG tablet Take 1 tablet (600 mg total) by mouth 3 (three) times daily. Discontinue once Lyrica is available through Patient assistance program (Patient not taking: Reported on 06/22/2017) 90 tablet 1   No facility-administered medications prior to  visit.     ROS Review of Systems  Constitutional: Negative for activity change, appetite change and fatigue.  HENT: Negative for congestion, sinus pressure and sore throat.   Eyes: Negative for visual disturbance.  Respiratory: Negative for cough, chest tightness, shortness of breath and wheezing.   Cardiovascular: Negative for chest pain and palpitations.  Gastrointestinal: Negative for abdominal distention, abdominal pain and constipation.  Endocrine: Negative for polydipsia.  Genitourinary: Negative for dysuria and frequency.  Musculoskeletal: Negative for arthralgias and back pain.  Skin:       See hpi  Neurological: Positive for numbness. Negative for tremors and light-headedness.  Hematological: Does not bruise/bleed easily.  Psychiatric/Behavioral: Negative for agitation and behavioral problems.    Objective:  BP (!) 164/82   Pulse 80   Temp (!) 97.2 F (36.2 C) (Oral)   Ht 5\' 4"  (1.626 m)   Wt 202 lb 9.6 oz (91.9 kg)   SpO2 97%   BMI 34.78 kg/m   BP/Weight 08/24/2017 07/13/2017 07/06/2017  Systolic BP 164 137 130  Diastolic BP 82 78 82  Wt. (Lbs) 202.6 200.2 203  BMI 34.78 34.36 34.84      Physical Exam  Constitutional: She is oriented to person, place, and time. She appears well-developed and well-nourished.  Cardiovascular: Normal rate, normal heart sounds and intact distal pulses.  No murmur heard. Pulmonary/Chest: Effort normal and breath sounds normal. She has no wheezes. She has no rales. She exhibits no tenderness.  Abdominal: Soft. Bowel sounds are normal. She exhibits no distension and no mass. There is no tenderness.  Musculoskeletal: Normal range of motion. She exhibits edema (1+ R ankle edema).  L transmetatarsal amputation  Neurological: She is alert and oriented to person, place, and time.  Skin:  Bilateral heel ulcers with scab over left ulcer and right with erythematous base, no discharge.  Psychiatric: She has a normal mood and affect.      Lab Results  Component Value Date   HGBA1C 10.1 07/13/2017    Assessment & Plan:   1. Type 2 diabetes mellitus with hyperosmolarity without coma, without long-term current use of insulin (HCC) Uncontrolled with A1c of 10.1 which is above goal of <7.0 Increase dose of Metformin and Humulin 70/30 Counseled on Diabetic diet, my plate method, 403150 minutes of moderate intensity exercise/week Keep blood sugar logs with fasting goals of 80-120 mg/dl, random of less than 474180 and in the event of sugars less than 60 mg/dl or greater than 259400 mg/dl please notify the clinic ASAP. It is recommended that you undergo annual eye exams and annual foot exams. Pneumonia vaccine is recommended. - POCT glucose (manual entry) - insulin NPH-regular Human (HUMULIN 70/30) (70-30) 100 UNIT/ML injection; Inject subcutaneously twice daily 55 units before breakfast and 60 units dinner  Dispense: 90 mL; Refill: 6  2. Type 2 diabetes mellitus with retinopathy without macular edema, with long-term current  use of insulin, unspecified laterality, unspecified retinopathy severity (HCC) - metFORMIN (GLUCOPHAGE) 1000 MG tablet; TAKE 1 TABLET BY MOUTH TWICE DAILY WITH A MEAL  Dispense: 60 tablet; Refill: 6 - atorvastatin (LIPITOR) 40 MG tablet; Take 1 tablet (40 mg total) by mouth daily.  Dispense: 30 tablet; Refill: 5  3. Stress incontinence of urine Stable - oxybutynin (DITROPAN XL) 10 MG 24 hr tablet; Take 1 tablet (10 mg total) by mouth at bedtime.  Dispense: 30 tablet; Refill: 6  4. Superficial burn of foot, unspecified laterality, subsequent encounter Improving slowly Will repeat round of antibiotics; avoid repeated trauma - cephALEXin (KEFLEX) 500 MG capsule; Take 1 capsule (500 mg total) by mouth 2 (two) times daily.  Dispense: 14 capsule; Refill: 0 - silver sulfADIAZINE (SILVADENE) 1 % cream; Apply 1 application topically 2 (two) times daily.  Dispense: 85 g; Refill: 1  5. Pedal edema Placed on short  course f Lasix - furosemide (LASIX) 20 MG tablet; Take 1 tablet (20 mg total) by mouth daily.  Dispense: 30 tablet; Refill: 0  6. Other insomnia Controlled - amitriptyline (ELAVIL) 100 MG tablet; Take 1 tablet (100 mg total) by mouth at bedtime.  Dispense: 30 tablet; Refill: 5  7. Other diabetic neurological complication associated with type 2 diabetes mellitus (HCC) Uncontrolled on Lyrica and Cymbalta Referred to Neurology previously - DULoxetine (CYMBALTA) 60 MG capsule; Take 1 capsule (60 mg total) by mouth daily.  Dispense: 30 capsule; Refill: 5  8. Essential hypertension Uncontrolled BP was controlled at 137/78 at her last office visit so I willmake no changes today Counseled on blood pressure goal of less than 130/80, low-sodium, DASH diet, medication compliance, 150 minutes of moderate intensity exercise per week. Discussed medication compliance, adverse effects. - lisinopril (PRINIVIL,ZESTRIL) 20 MG tablet; Take 1 tablet (20 mg total) by mouth daily.  Dispense: 30 tablet; Refill: 5 - metoprolol tartrate (LOPRESSOR) 50 MG tablet; Take 1 tablet (50 mg total) by mouth 2 (two) times daily.  Dispense: 60 tablet; Refill: 5   Meds ordered this encounter  Medications  . metFORMIN (GLUCOPHAGE) 1000 MG tablet    Sig: TAKE 1 TABLET BY MOUTH TWICE DAILY WITH A MEAL    Dispense:  60 tablet    Refill:  6  . insulin NPH-regular Human (HUMULIN 70/30) (70-30) 100 UNIT/ML injection    Sig: Inject subcutaneously twice daily 55 units before breakfast and 60 units dinner    Dispense:  90 mL    Refill:  6    Discontinue previous dose  . oxybutynin (DITROPAN XL) 10 MG 24 hr tablet    Sig: Take 1 tablet (10 mg total) by mouth at bedtime.    Dispense:  30 tablet    Refill:  6    Discontinue 5 mg  . cephALEXin (KEFLEX) 500 MG capsule    Sig: Take 1 capsule (500 mg total) by mouth 2 (two) times daily.    Dispense:  14 capsule    Refill:  0  . furosemide (LASIX) 20 MG tablet    Sig: Take 1  tablet (20 mg total) by mouth daily.    Dispense:  30 tablet    Refill:  0  . amitriptyline (ELAVIL) 100 MG tablet    Sig: Take 1 tablet (100 mg total) by mouth at bedtime.    Dispense:  30 tablet    Refill:  5  . atorvastatin (LIPITOR) 40 MG tablet    Sig: Take 1 tablet (40 mg total) by mouth daily.  Dispense:  30 tablet    Refill:  5  . DULoxetine (CYMBALTA) 60 MG capsule    Sig: Take 1 capsule (60 mg total) by mouth daily.    Dispense:  30 capsule    Refill:  5  . lisinopril (PRINIVIL,ZESTRIL) 20 MG tablet    Sig: Take 1 tablet (20 mg total) by mouth daily.    Dispense:  30 tablet    Refill:  5  . metoprolol tartrate (LOPRESSOR) 50 MG tablet    Sig: Take 1 tablet (50 mg total) by mouth 2 (two) times daily.    Dispense:  60 tablet    Refill:  5  . silver sulfADIAZINE (SILVADENE) 1 % cream    Sig: Apply 1 application topically 2 (two) times daily.    Dispense:  85 g    Refill:  1    Follow-up: Return in about 3 months (around 11/24/2017) for follow up of chronic medical conditions.   Hoy Register MD

## 2017-09-06 ENCOUNTER — Other Ambulatory Visit: Payer: Self-pay

## 2017-09-06 ENCOUNTER — Encounter (HOSPITAL_COMMUNITY): Payer: Self-pay | Admitting: Emergency Medicine

## 2017-09-06 ENCOUNTER — Emergency Department (HOSPITAL_COMMUNITY)
Admission: EM | Admit: 2017-09-06 | Discharge: 2017-09-06 | Disposition: A | Discharge status: Home / Self Care | Payer: Self-pay | Attending: Emergency Medicine | Admitting: Emergency Medicine

## 2017-09-06 DIAGNOSIS — Z794 Long term (current) use of insulin: Secondary | ICD-10-CM | POA: Insufficient documentation

## 2017-09-06 DIAGNOSIS — E1140 Type 2 diabetes mellitus with diabetic neuropathy, unspecified: Secondary | ICD-10-CM | POA: Insufficient documentation

## 2017-09-06 DIAGNOSIS — Z8669 Personal history of other diseases of the nervous system and sense organs: Secondary | ICD-10-CM | POA: Insufficient documentation

## 2017-09-06 DIAGNOSIS — H1131 Conjunctival hemorrhage, right eye: Secondary | ICD-10-CM | POA: Insufficient documentation

## 2017-09-06 DIAGNOSIS — F17210 Nicotine dependence, cigarettes, uncomplicated: Secondary | ICD-10-CM | POA: Insufficient documentation

## 2017-09-06 DIAGNOSIS — Z89422 Acquired absence of other left toe(s): Secondary | ICD-10-CM | POA: Insufficient documentation

## 2017-09-06 DIAGNOSIS — E119 Type 2 diabetes mellitus without complications: Secondary | ICD-10-CM | POA: Insufficient documentation

## 2017-09-06 DIAGNOSIS — Z7982 Long term (current) use of aspirin: Secondary | ICD-10-CM | POA: Insufficient documentation

## 2017-09-06 DIAGNOSIS — Z79899 Other long term (current) drug therapy: Secondary | ICD-10-CM | POA: Insufficient documentation

## 2017-09-06 DIAGNOSIS — I10 Essential (primary) hypertension: Secondary | ICD-10-CM | POA: Insufficient documentation

## 2017-09-06 MED ORDER — TETRACAINE HCL 0.5 % OP SOLN
1.0000 [drp] | Freq: Once | OPHTHALMIC | Status: AC
  Administered 2017-09-06: 1 [drp] via OPHTHALMIC
  Filled 2017-09-06: qty 4

## 2017-09-06 MED ORDER — FLUORESCEIN SODIUM 1 MG OP STRP
1.0000 | ORAL_STRIP | Freq: Once | OPHTHALMIC | Status: AC
  Administered 2017-09-06: 1 via OPHTHALMIC
  Filled 2017-09-06: qty 1

## 2017-09-06 NOTE — Discharge Instructions (Signed)
Keep the eye clean.  Wash hands frequently.  Follow-up with ophthalmology in 2 days for reevaluation of your symptoms.  Return to the emergency department immediately for any concerning signs or symptoms develop such as swelling, worsening vision changes, high fevers, double vision

## 2017-09-06 NOTE — ED Provider Notes (Signed)
[facility] EMERGENCY DEPARTMENT Provider Note   CSN: [id number] Arrival date & time: 09/06/17  1835     History   Chief Complaint Chief Complaint  Patient presents with  . Blurred Vision    [name] is a 57 y.o. female with history of Bell's palsy, diabetes mellitus, hypertension, diabetic neuropathy presents for evaluation of acute onset, personally worsening right eye redness and blurry vision for 1 day.  She states that last night while she was applying Silvadene cream to a burn on her foot, she rubbed her right eye.  She did not notice any acute abnormalities at that time and went to sleep.  When she awoke this morning she noticed a small spot of bright red color change around her right pupil.  She states that as the day has gone on this redness has spread to the nasal side.  At around 5 PM she noticed some blurry vision of the right eye which she states has progressively worsened.  She notes that the right eye is "my bad I ", but she does think that her vision has worsened from her baseline.  Earlier today she did call poison control who recommended that she irrigate the eye which she did for 15 minutes.  She also applied an ice pack to the right eye.  She denies fevers, headaches, new or worsening numbness, weakness.  She denies any pain to the eye, no photophobia, no excessive tearing or itching.  She is not a contact lens wearer.  She denies any known trauma to the eye otherwise.  The history is provided by the patient.    Past Medical History:  Diagnosis Date  . Bell's palsy 2005  . Diabetes mellitus without complication (HCC)   . Hypertension   . Neuromuscular disorder (HCC)    NEUROPATHY  . Neuropathy   . Sepsis (HCC) 02/11/2016    Patient Active Problem List   Diagnosis Date Noted  . Overactive bladder 06/22/2017  . Sepsis (HCC) 02/10/2016  . Diabetic ketoacidosis without coma associated with type 2 diabetes mellitus (HCC)   . Lactic  acidosis   . Urinary tract infection without hematuria   . Essential hypertension   . 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 Hypokalemia 03/15/2014  . Foot abscess, left 03/15/2014  . Wound infection 03/14/2014  . Diabetic foot ulcer (HCC) 03/14/2014  . DM type 2 (diabetes mellitus, type 2) (HCC) 03/14/2014  . Cellulitis of left foot 03/14/2014  . Bell's palsy   . VAGINITIS NOS 09/30/2006  . Depression, major, single episode, mild (HCC) 08/05/2006  . Diabetic neuropathy (HCC) 08/05/2006  . HYPERTENSION 08/05/2006  . Insomnia 08/05/2006  . BELL'S PALSY, HX OF 08/05/2006  . HYSTERECTOMY, HX OF 08/05/2006  . CHOLECYSTECTOMY, HX OF 08/05/2006    Past Surgical History:  Procedure Laterality Date  . AMPUTATION Left 03/15/2014   Procedure: AMPUTATION TRANSMETATARSAL;  Surgeon: Nadara Mustard, MD;  Location: [facility] OR;  Service: Orthopedics;  Laterality: Left;  . CESAREAN SECTION    . CHOLECYSTECTOMY       OB History   None      Home Medications    Prior to Admission medications   Medication Sig Start Date End Date Taking? Authorizing Provider  acetaminophen (TYLENOL) 500 MG tablet Take 1,000 mg by mouth every 6 (six) hours as needed for fever.    [provider]  amitriptyline (ELAVIL) 100 MG tablet Take 1 tablet (100 mg total) by mouth  at bedtime. 08/24/17   Hoy RegisterNewlin, Enobong, MD  aspirin EC 81 MG tablet Take 81 mg by mouth at bedtime.     [provider]  atorvastatin (LIPITOR) 40 MG tablet Take 1 tablet (40 mg total) by mouth daily. 08/24/17   Hoy RegisterNewlin, Enobong, MD  cephALEXin (KEFLEX) 500 MG capsule Take 1 capsule (500 mg total) by mouth 2 (two) times daily. 08/24/17   Hoy RegisterNewlin, Enobong, MD  cetirizine (ZYRTEC) 10 MG tablet Take 1 tablet (10 mg total) by mouth daily. Patient not taking: Reported on 08/24/2017 04/13/17   Hoy RegisterNewlin, Enobong, MD  clotrimazole (LOTRIMIN) 1 % cream Apply 1 application topically 2 (two) times daily. Patient not taking: Reported  on 08/24/2017 06/22/17   Hoy RegisterNewlin, Enobong, MD  Cyanocobalamin (VITAMIN B-12 PO) Take 1 tablet by mouth 2 (two) times daily.    [provider]  DULoxetine (CYMBALTA) 60 MG capsule Take 1 capsule (60 mg total) by mouth daily. 08/24/17   Hoy RegisterNewlin, Enobong, MD  furosemide (LASIX) 20 MG tablet Take 1 tablet (20 mg total) by mouth daily. 08/24/17   Hoy RegisterNewlin, Enobong, MD  guaiFENesin (MUCINEX) 600 MG 12 hr tablet Take 1 tablet (600 mg total) by mouth 2 (two) times daily. Patient not taking: Reported on 04/13/2017 12/09/16   Hoy RegisterNewlin, Enobong, MD  insulin NPH-regular Human (HUMULIN 70/30) (70-30) 100 UNIT/ML injection Inject subcutaneously twice daily 55 units before breakfast and 60 units dinner 08/24/17   Hoy RegisterNewlin, Enobong, MD  Insulin Syringe-Needle U-100 (TRUEPLUS INSULIN SYRINGE) 30G X 5/16" 1 ML MISC Use as directed 04/13/17   Hoy RegisterNewlin, Enobong, MD  lisinopril (PRINIVIL,ZESTRIL) 20 MG tablet Take 1 tablet (20 mg total) by mouth daily. 08/24/17   Hoy RegisterNewlin, Enobong, MD  metFORMIN (GLUCOPHAGE) 1000 MG tablet TAKE 1 TABLET BY MOUTH TWICE DAILY WITH A MEAL 08/24/17   Hoy RegisterNewlin, Enobong, MD  metoprolol tartrate (LOPRESSOR) 50 MG tablet Take 1 tablet (50 mg total) by mouth 2 (two) times daily. 08/24/17   Hoy RegisterNewlin, Enobong, MD  oxybutynin (DITROPAN XL) 10 MG 24 hr tablet Take 1 tablet (10 mg total) by mouth at bedtime. 08/24/17   Hoy RegisterNewlin, Enobong, MD  pregabalin (LYRICA) 100 MG capsule Take 1 capsule (100 mg total) by mouth 3 (three) times daily. 07/20/17   Hoy RegisterNewlin, Enobong, MD  silver sulfADIAZINE (SILVADENE) 1 % cream Apply 1 application topically 2 (two) times daily. 08/24/17   Hoy RegisterNewlin, Enobong, MD    Family History Family History  Problem Relation Age of Onset  . Heart attack Mother   . Diabetes Father   . Diabetes Sister     Social History Social History   Tobacco Use  . Smoking status: Current Every Day Smoker    Packs/day: 1.00    Years: 38.00    Pack years: 38.00    Types: Cigarettes    Last attempt to quit:  09/19/2013    Years since quitting: 3.9  . Smokeless tobacco: Never Used  Substance Use Topics  . Alcohol use: No  . Drug use: Yes    Types: Marijuana     Allergies   Patient has no known allergies.   Review of Systems Review of Systems  Constitutional: Negative for fever.  Eyes: Positive for redness and visual disturbance. Negative for photophobia, pain, discharge and itching.  Neurological: Negative for weakness, numbness (chronic, unchanged) and headaches.     Physical Exam Updated Vital Signs BP (!) 142/74 (BP Location: Right Arm)   Pulse 88   Temp 97.8 F (36.6 C) (Oral)   Resp  18   Ht 5\' 4"  (1.626 m)   Wt 90.7 kg (200 lb)   SpO2 100%   BMI 34.33 kg/m   Physical Exam  Constitutional: She is oriented to person, place, and time. She appears well-developed and well-nourished. No distress.  HENT:  Head: Normocephalic and atraumatic.  Eyes: Pupils are equal, round, and reactive to light. EOM are normal. Right eye exhibits no discharge. Left eye exhibits no discharge. No scleral icterus.  See attached images.  There is what appears to be a subconjunctival hemorrhage to the superior aspect of the right eye.  No chemosis, proptosis, or consensual photophobia.  No pain with EOMs.  No tenderness to palpation of the orbital rim.  No erythema or swelling of the eyelids.  No foreign bodies noted.    Visual Acuity  Right Eye Distance: 20/80 Left Eye Distance: 20/40 Bilateral Distance: 20/40  On fluorescein stain of the right eye, there is no corneal abrasion or ulceration, no dendritic lesions, no breast rings.  Seidel sign is absent.  Mild uptake of floor seen within the conjunctival.  Average IOP of the left eye: 16  Average IOP of right eye: 17   Neck: Normal range of motion. Neck supple. No JVD present. No tracheal deviation present.  Cardiovascular: Normal rate.  Pulmonary/Chest: Effort normal.  Abdominal: She exhibits no distension.  Musculoskeletal: She exhibits  no edema.  Lymphadenopathy:    She has no cervical adenopathy.  Neurological: She is alert and oriented to person, place, and time. No cranial nerve deficit. She exhibits normal muscle tone.  Fluent speech with no evidence of dysarthria or aphasia, no facial droop.  Cranial nerves II through XII tested and intact.  Skin: Skin is warm and dry. No erythema.  Psychiatric: She has a normal mood and affect. Her behavior is normal.  Nursing note and vitals reviewed.      ED Treatments / Results  Labs (all labs ordered are listed, but only abnormal results are displayed) Labs Reviewed - No data to display  EKG None  Radiology No results found.  Procedures Procedures (including critical care time)  Medications Ordered in ED Medications  tetracaine (PONTOCAINE) 0.5 % ophthalmic solution 1 drop (1 drop Right Eye Given 09/06/17 1942)  fluorescein ophthalmic strip 1 strip (1 strip Right Eye Given 09/06/17 1942)     Initial Impression / Assessment and Plan / ED Course  I have reviewed the triage vital signs and the nursing notes.  Pertinent labs & imaging results that were available during my care of the patient were reviewed by me and considered in my medical decision making (see chart for details).     Patient with what appears to be a subconjunctival hemorrhage of the right eye noted today.  She is complaining of blurry vision of the right but also tells me that her right eye is typically worse than her left eye.  Mild difference in her visual acuity in the right eye as compared to the left, however this may be her baseline.  She is afebrile, very mildly hypertensive in the ED but no evidence of papilledema on funduscopic examination.  Fluorescein stain shows no evidence of corneal abrasion or ulceration, no dendritic lesions.  No foreign bodies noted.  IOP is within normal limits bilaterally.  No concern for acute angle-closure glaucoma.  No evidence of nerve entrapment or globe  rupture.  Stable for discharge home with follow-up with ophthalmology in the next 24 to 48 hours.  Discussed strict ED  return precautions. Pt verbalized understanding of and agreement with plan and is safe for discharge home at this time.   Final Clinical Impressions(s) / ED Diagnoses   Final diagnoses:  Subconjunctival hemorrhage of right eye    ED Discharge Orders    None       Bennye Alm 09/08/17 1502    Linwood Dibbles, MD 09/10/17 1624

## 2017-09-06 NOTE — ED Triage Notes (Signed)
Pt states she has had blurry vision in her right eye since yesterday. Suspected she had gotten some silvadene cream into her eye she was using on her skin.  Called poison control and suggested she follow up here.

## 2017-09-06 NOTE — ED Provider Notes (Signed)
Patient placed in Quick Look pathway, seen and evaluated   Chief Complaint: Right eye redness  HPI:   She reports that yesterday she noticed having redness around her left eye .  This started after she possibly got burn cream into her eye.  She reports that her vision is starting to blur.    ROS: No headache  Physical Exam:   Gen: No distress  Neuro: Awake and Alert  Skin: Warm    Focused Exam: Mild redness around right eye.     Initiation of care has begun. The patient has been counseled on the process, plan, and necessity for staying for the completion/evaluation, and the remainder of the medical screening examination    Norman ClayHammond, Kennethia Lynes W, PA-C 09/06/17 1910    Terrilee FilesButler, Michael C, MD 09/07/17 1121

## 2017-09-19 MED FILL — $Humulin 70/30 vial: (70-30) 100 | 30 days supply | Qty: 30 | Fill #5

## 2017-09-23 ENCOUNTER — Telehealth: Payer: Self-pay | Admitting: Family Medicine

## 2017-09-23 ENCOUNTER — Telehealth: Payer: Self-pay | Admitting: Emergency Medicine

## 2017-09-23 NOTE — Telephone Encounter (Signed)
Patient stopped by the office today and stated that she needed her eye drop medication filled, patient stated the doctor she saw yesterday at the ER attempted to send meds over

## 2017-09-23 NOTE — Telephone Encounter (Signed)
[facility] eye medication from 09/06/2017 that was prescribed in the ED.  On chart review it was noted pt was not given any prescriptions and was told to follow up with opthalmology.  No further CM needs noted at this time.

## 2017-09-27 MED FILL — AMITRIPTYLINE HCL 100 MG TA: 100 | 30 days supply | Qty: 30 | Fill #1

## 2017-10-18 MED FILL — METOPROLOL TARTRATE 50 MG T: 50 | 30 days supply | Qty: 60 | Fill #5

## 2017-10-18 MED FILL — $Humulin 70/30 vial: (70-30) 100 | 27 days supply | Qty: 30 | Fill #4

## 2017-10-18 MED FILL — ATORVASTATIN CALCIUM 40 MG: 40 | 30 days supply | Qty: 30 | Fill #2

## 2017-10-21 ENCOUNTER — Telehealth: Payer: Self-pay | Admitting: Family Medicine

## 2017-10-21 NOTE — Telephone Encounter (Signed)
Noted  

## 2017-10-21 NOTE — Telephone Encounter (Signed)
Patient came in and said she is having surgery aug 22 for the bleeding behind her eye.

## 2017-10-26 MED FILL — AMITRIPTYLINE HCL 100 MG TA: 100 | 30 days supply | Qty: 30 | Fill #2

## 2017-10-26 MED FILL — DULoxetine HCL 60 MG CPEP: 60 | 30 days supply | Qty: 30 | Fill #4

## 2017-10-26 MED FILL — metFORMIN HCL 1000 MG TABS: 1000 | 30 days supply | Qty: 60 | Fill #1

## 2017-10-26 MED FILL — LISINOPRIL 20 MG TAB: 20 | 30 days supply | Qty: 30 | Fill #5

## 2017-11-03 ENCOUNTER — Telehealth: Payer: Self-pay | Admitting: Family Medicine

## 2017-11-03 NOTE — Telephone Encounter (Signed)
Patient called to let provider know her surgery got rescheduled to next month.

## 2017-11-03 NOTE — Telephone Encounter (Signed)
Will route to PCP 

## 2017-11-15 MED FILL — $Humulin 70/30 vial: (70-30) 100 | 27 days supply | Qty: 30 | Fill #5

## 2017-11-24 MED FILL — AMITRIPTYLINE HCL 100 MG TA: 100 | 30 days supply | Qty: 30 | Fill #3

## 2017-12-01 IMAGING — CR DG CHEST 1V PORT
1 series · 1 of 1 positions shown · non-contrast
Comparison: Portable exam 3364 hours compared to 11/12/2013

CLINICAL DATA: Fever, nausea, and body aches today, hypertension,
diabetes mellitus

EXAM:
PORTABLE CHEST 1 VIEW

[AP]
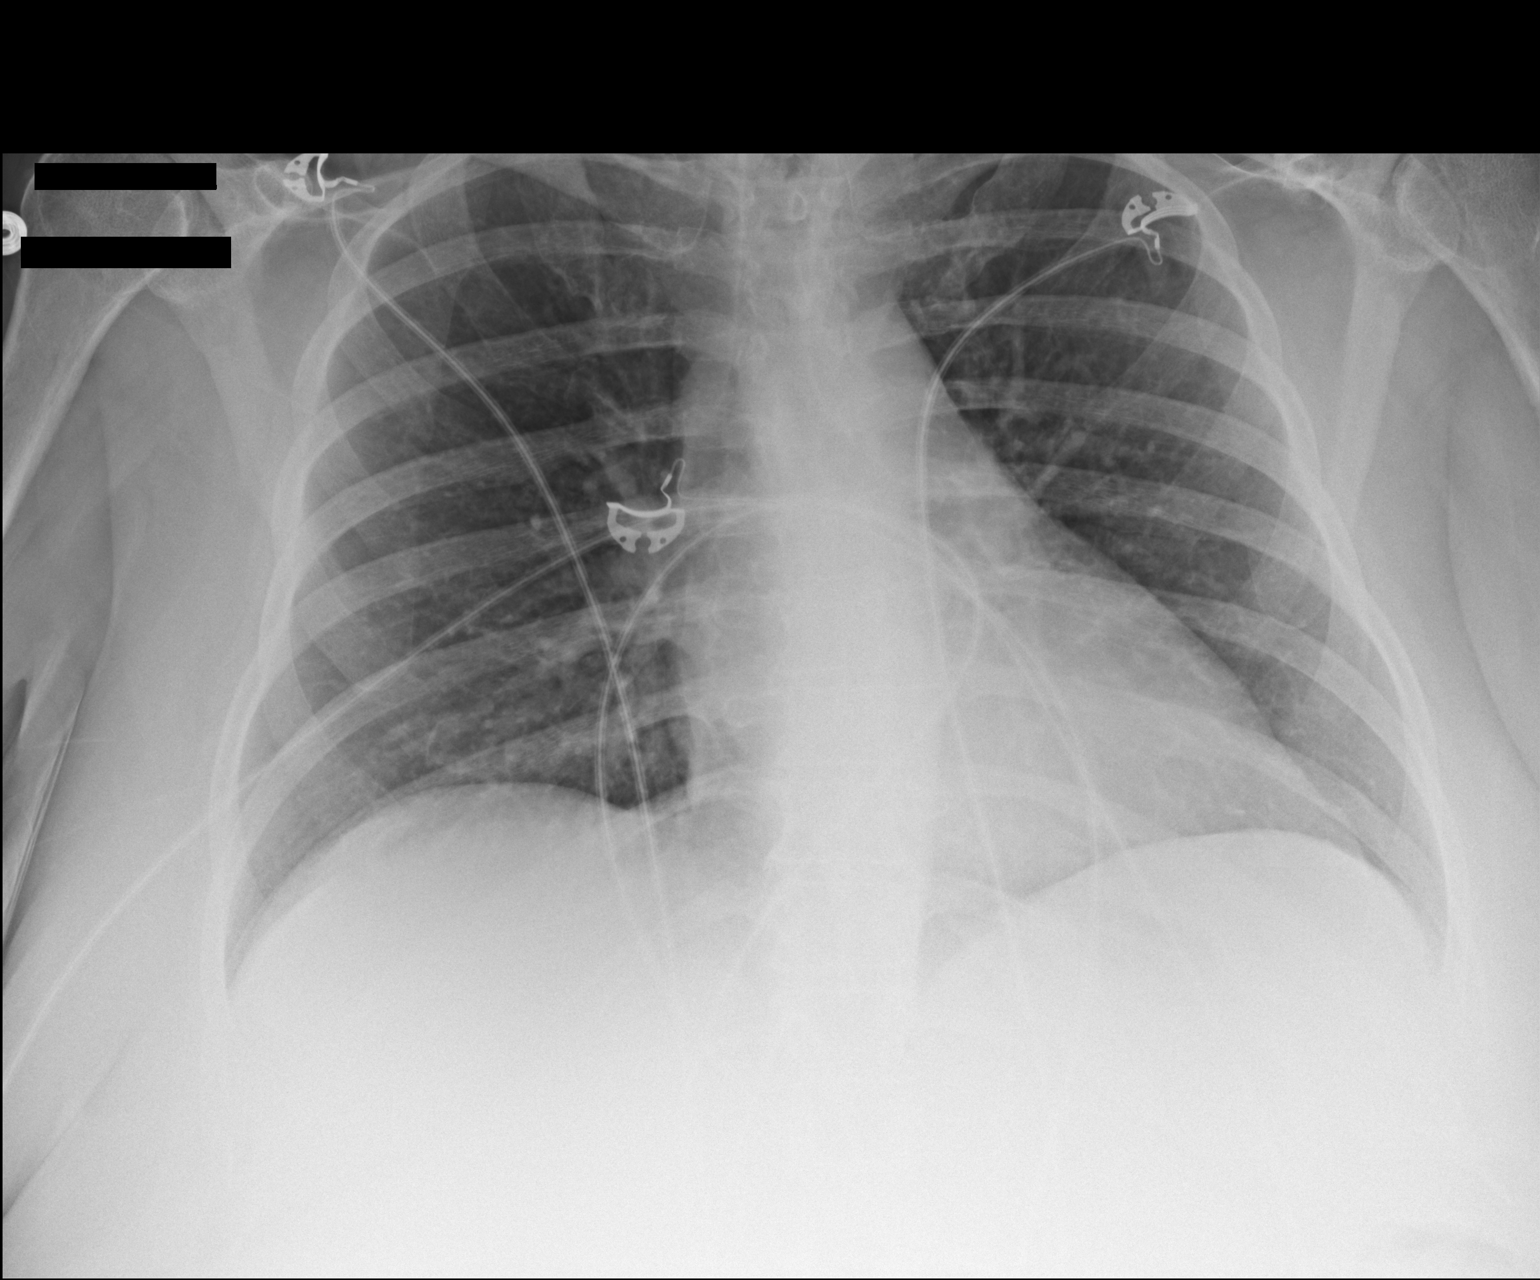

[1 of 1 positions shown; findings below may reference images not displayed]

FINDINGS: Normal heart size, mediastinal contours, and pulmonary vascularity.

Lungs clear.

No pleural effusion or pneumothorax.

Bones unremarkable.
IMPRESSION: No acute abnormalities.

## 2017-12-12 ENCOUNTER — Other Ambulatory Visit: Payer: Self-pay | Admitting: Family Medicine

## 2017-12-12 DIAGNOSIS — N393 Stress incontinence (female) (male): Secondary | ICD-10-CM

## 2017-12-12 MED FILL — METOPROLOL TARTRATE 50 MG T: 50 | 30 days supply | Qty: 60 | Fill #0

## 2017-12-12 MED FILL — OXYBUTYNIN CHLORIDE ER 10 M: 10 | 30 days supply | Qty: 30 | Fill #1

## 2017-12-12 MED FILL — DULoxetine HCL 60 MG CPEP: 60 | 30 days supply | Qty: 30 | Fill #5

## 2017-12-12 MED FILL — LISINOPRIL 20 MG TAB: 20 | 30 days supply | Qty: 30 | Fill #0

## 2017-12-12 MED FILL — ATORVASTATIN CALCIUM 40 MG: 40 | 30 days supply | Qty: 30 | Fill #3

## 2017-12-12 MED FILL — !HUMULIN 70/30 VIAL: 70/30 | 27 days supply | Qty: 30 | Fill #6

## 2017-12-12 MED FILL — metFORMIN HCL 500 MG TABS: 500 | 30 days supply | Qty: 60 | Fill #4

## 2017-12-29 MED FILL — AMITRIPTYLINE HCL 100 MG TA: 100 | 30 days supply | Qty: 30 | Fill #4

## 2018-01-06 MED FILL — $Humulin 70/30 vial: (70-30) 100 | 27 days supply | Qty: 30 | Fill #7

## 2018-01-17 ENCOUNTER — Other Ambulatory Visit: Payer: Self-pay | Admitting: Family Medicine

## 2018-01-17 DIAGNOSIS — N393 Stress incontinence (female) (male): Secondary | ICD-10-CM

## 2018-01-17 DIAGNOSIS — R600 Localized edema: Secondary | ICD-10-CM

## 2018-01-17 MED FILL — METOPROLOL TARTRATE 50 MG T: 50 | 30 days supply | Qty: 60 | Fill #1

## 2018-01-17 MED FILL — LISINOPRIL 20 MG TAB: 20 | 30 days supply | Qty: 30 | Fill #1

## 2018-01-17 MED FILL — OXYBUTYNIN CL ER 10 MG TAB: 10 | 30 days supply | Qty: 30 | Fill #2

## 2018-01-17 MED FILL — metFORMIN HCL 500 MG TABS: 500 | 30 days supply | Qty: 60 | Fill #5

## 2018-01-17 MED FILL — ATORVASTATIN CALCIUM 40 MG: 40 | 30 days supply | Qty: 30 | Fill #4

## 2018-01-17 MED FILL — DULoxetine HCL 60 MG CPEP: 60 | 30 days supply | Qty: 30 | Fill #0

## 2018-01-18 MED FILL — FUROSEMIDE 20 MG TABLET: 20 | 30 days supply | Qty: 30 | Fill #0

## 2018-01-27 MED FILL — AMITRIPTYLINE HCL 100 MG TA: 100 | 30 days supply | Qty: 30 | Fill #5

## 2018-01-31 MED FILL — $Humulin 70/30 vial: (70-30) 100 | 27 days supply | Qty: 30 | Fill #8

## 2018-02-21 ENCOUNTER — Other Ambulatory Visit: Payer: Self-pay | Admitting: Family Medicine

## 2018-02-21 DIAGNOSIS — E11319 Type 2 diabetes mellitus with unspecified diabetic retinopathy without macular edema: Secondary | ICD-10-CM

## 2018-02-21 DIAGNOSIS — Z794 Long term (current) use of insulin: Principal | ICD-10-CM

## 2018-02-21 MED FILL — LISINOPRIL 20 MG TAB: 20 | 30 days supply | Qty: 30 | Fill #2

## 2018-02-21 MED FILL — OXYBUTYNIN CL ER 10 MG TAB: 10 | 30 days supply | Qty: 30 | Fill #3

## 2018-02-21 MED FILL — ATORVASTATIN CALCIUM 40 MG: 40 | 30 days supply | Qty: 30 | Fill #5

## 2018-02-21 MED FILL — METOPROLOL TARTRATE 50 MG T: 50 | 30 days supply | Qty: 60 | Fill #2

## 2018-02-21 MED FILL — DULoxetine HCL 60 MG CPEP: 60 | 30 days supply | Qty: 30 | Fill #1

## 2018-02-22 MED FILL — metFORMIN HCL 1000 MG TABS: 1000 | 30 days supply | Qty: 60 | Fill #2

## 2018-02-24 MED FILL — $Humulin 70/30 vial: (70-30) 100 | 53 days supply | Qty: 60 | Fill #9

## 2018-02-24 MED FILL — AMITRIPTYLINE HCL 100 MG TA: 100 | 30 days supply | Qty: 30 | Fill #0

## 2018-03-01 ENCOUNTER — Other Ambulatory Visit: Payer: Self-pay

## 2018-03-01 DIAGNOSIS — E1149 Type 2 diabetes mellitus with other diabetic neurological complication: Secondary | ICD-10-CM

## 2018-03-01 NOTE — Telephone Encounter (Signed)
Pt last seen: 08/24/17 Next appt: n/a Last RX written on: 07/20/17 Date of original fill: 08/19/17 Date of refill(s): 12/06/17  No other controlled substances were filled during this time, please refill if appropriate.

## 2018-03-03 MED ORDER — PREGABALIN 100 MG PO CAPS: 100.0000 mg | ORAL_CAPSULE | Freq: Three times a day (TID) | ORAL | 1 refills | Status: DC

## 2018-03-30 MED FILL — AMITRIPTYLINE HCL 100 MG TA: 100 | 30 days supply | Qty: 30 | Fill #1

## 2018-03-31 MED FILL — TRUEPLUS SYR 1ML 30GX5/16": 30G X 5/16" | 25 days supply | Qty: 100 | Fill #0

## 2018-03-31 MED FILL — TRUEPLUS SYR 1ML 30GX5/16: 30G X 5/16" | 25 days supply | Qty: 100 | Fill #0

## 2018-04-13 ENCOUNTER — Other Ambulatory Visit: Payer: Self-pay | Admitting: Family Medicine

## 2018-04-13 DIAGNOSIS — E11319 Type 2 diabetes mellitus with unspecified diabetic retinopathy without macular edema: Secondary | ICD-10-CM

## 2018-04-13 DIAGNOSIS — Z794 Long term (current) use of insulin: Secondary | ICD-10-CM

## 2018-04-13 DIAGNOSIS — N393 Stress incontinence (female) (male): Secondary | ICD-10-CM

## 2018-04-13 MED FILL — DULoxetine HCL 60 MG CPEP: 60 | 30 days supply | Qty: 30 | Fill #2

## 2018-04-13 MED FILL — metFORMIN HCL 1000 MG TABS: 1000 | 30 days supply | Qty: 60 | Fill #3

## 2018-04-13 MED FILL — METOPROLOL TARTRATE 50 MG T: 50 | 30 days supply | Qty: 60 | Fill #3

## 2018-04-13 MED FILL — ATORVASTATIN CALCIUM 40 MG: 40 | 30 days supply | Qty: 30 | Fill #0

## 2018-04-13 MED FILL — OXYBUTYNIN CL ER 10 MG TAB: 10 | 30 days supply | Qty: 30 | Fill #4

## 2018-05-01 ENCOUNTER — Encounter: Payer: Self-pay | Admitting: Family Medicine

## 2018-05-01 ENCOUNTER — Ambulatory Visit: Payer: Self-pay | Attending: Family Medicine | Admitting: Family Medicine

## 2018-05-01 ENCOUNTER — Other Ambulatory Visit: Payer: Self-pay | Admitting: Family Medicine

## 2018-05-01 VITALS — BP 169/92 | HR 77 | Temp 98.2°F | Ht 64.0 in | Wt 200.4 lb

## 2018-05-01 DIAGNOSIS — I10 Essential (primary) hypertension: Secondary | ICD-10-CM

## 2018-05-01 DIAGNOSIS — E1149 Type 2 diabetes mellitus with other diabetic neurological complication: Secondary | ICD-10-CM

## 2018-05-01 DIAGNOSIS — G4709 Other insomnia: Secondary | ICD-10-CM

## 2018-05-01 DIAGNOSIS — Z0000 Encounter for general adult medical examination without abnormal findings: Secondary | ICD-10-CM

## 2018-05-01 DIAGNOSIS — Z794 Long term (current) use of insulin: Secondary | ICD-10-CM

## 2018-05-01 DIAGNOSIS — E11319 Type 2 diabetes mellitus with unspecified diabetic retinopathy without macular edema: Secondary | ICD-10-CM

## 2018-05-01 DIAGNOSIS — N393 Stress incontinence (female) (male): Secondary | ICD-10-CM

## 2018-05-01 LAB — POCT GLYCOSYLATED HEMOGLOBIN (HGB A1C): HbA1c, POC (controlled diabetic range): 8.9 % — AB (ref 0.0–7.0)

## 2018-05-01 LAB — GLUCOSE, POCT (MANUAL RESULT ENTRY): POC GLUCOSE: 212 mg/dL — AB (ref 70–99)

## 2018-05-01 MED ORDER — OXYBUTYNIN CHLORIDE ER 10 MG PO TB24: 10.0000 mg | ORAL_TABLET | Freq: Every day | ORAL | 6 refills | Status: DC

## 2018-05-01 MED ORDER — INSULIN NPH ISOPHANE & REGULAR (70-30) 100 UNIT/ML ~~LOC~~ SUSP: SUBCUTANEOUS | 6 refills | Status: DC

## 2018-05-01 MED ORDER — METOPROLOL TARTRATE 50 MG PO TABS: 50.0000 mg | ORAL_TABLET | Freq: Two times a day (BID) | ORAL | 6 refills | Status: DC

## 2018-05-01 MED ORDER — DULOXETINE HCL 60 MG PO CPEP: 60.0000 mg | ORAL_CAPSULE | Freq: Every day | ORAL | 6 refills | Status: AC

## 2018-05-01 MED ORDER — LISINOPRIL 20 MG PO TABS: 20.0000 mg | ORAL_TABLET | Freq: Every day | ORAL | 6 refills | Status: DC

## 2018-05-01 MED ORDER — ATORVASTATIN CALCIUM 40 MG PO TABS: 40.0000 mg | ORAL_TABLET | Freq: Every day | ORAL | 6 refills | Status: DC

## 2018-05-01 MED ORDER — METFORMIN HCL 1000 MG PO TABS: ORAL_TABLET | ORAL | 6 refills | Status: AC

## 2018-05-01 MED ORDER — TRAMADOL HCL 50 MG PO TABS: 50.0000 mg | ORAL_TABLET | Freq: Every day | ORAL | 0 refills | Status: DC

## 2018-05-01 MED ORDER — AMITRIPTYLINE HCL 100 MG PO TABS: 100.0000 mg | ORAL_TABLET | Freq: Every day | ORAL | 6 refills | Status: DC

## 2018-05-01 MED FILL — LISINOPRIL 20 MG TAB: 20 | 30 days supply | Qty: 30 | Fill #3

## 2018-05-01 MED FILL — $Humulin 70/30 vial: (70-30) 100 | 69 days supply | Qty: 80 | Fill #0

## 2018-05-01 MED FILL — AMITRIPTYLINE HCL 100 MG TA: 100 | 30 days supply | Qty: 30 | Fill #0

## 2018-05-01 MED FILL — traMADol HCL 50 MG TABS: 50 | 30 days supply | Qty: 30 | Fill #0

## 2018-05-01 NOTE — Progress Notes (Signed)
Subjective:  Patient ID: Anna Wagner, female    DOB: Feb 15, 1961  Age: 58 y.o. MRN: [id number]  CC: Diabetes   [name]  is a 58 year old female with a history of type 2 diabetes mellitus (A1c 8.9), diabetic neuropathy, hypertension, insomnia, depression, transmetatarsal amputation of the left foot who presents for a follow-up visit. Her last office visit was 7 months ago and she endorses compliance with her medication.  Her A1c is 8.9 which is down from 10.1 previously and she informs me her fasting and random sugars have been less than 150.  She denies hypoglycemia and has been working on a diabetic diet.  Seen by [facility] last month and diagnosed with diabetic retinopathy however she lacks medical coverage to proceed with required procedure.  Neuropathy has been uncontrolled on Lyrica and Cymbalta however when she discontinues those medications her symptoms are worse. Her chronic medical conditions coupled with lack of insurance sometimes make her depressed especially when she is unable to play with her younger grandkids as she played with the older grandkids but she denies suicidal ideation or intents and declines seeing the LCSW she states she feels fine. Insomnia is controlled.  Past Medical History:  Diagnosis Date  . Bell's palsy 2005  . Diabetes mellitus without complication ([place])   . Hypertension   . Neuromuscular disorder ([place])    NEUROPATHY  . Neuropathy   . Sepsis ([place]) 02/11/2016    Past Surgical History:  Procedure Laterality Date  . AMPUTATION Left 03/15/2014   Procedure: AMPUTATION TRANSMETATARSAL;  Surgeon: Newt Minion, MD;  Location: [facility];  Service: Orthopedics;  Laterality: Left;  . CESAREAN SECTION    . CHOLECYSTECTOMY      Family History  Problem Relation Age of Onset  . Heart attack Mother   . Diabetes Father   . Diabetes Sister     No Known Allergies  Outpatient Medications Prior to Visit  Medication Sig Dispense Refill  .  acetaminophen (TYLENOL) 500 MG tablet Take 1,000 mg by mouth every 6 (six) hours as needed for fever.    Marland Kitchen aspirin EC 81 MG tablet Take 81 mg by mouth at bedtime.     . cephALEXin (KEFLEX) 500 MG capsule Take 1 capsule (500 mg total) by mouth 2 (two) times daily. 14 capsule 0  . clotrimazole (LOTRIMIN) 1 % cream Apply 1 application topically 2 (two) times daily. 113 g 1  . Cyanocobalamin (VITAMIN B-12 PO) Take 1 tablet by mouth 2 (two) times daily.    . Insulin Syringe-Needle U-100 (TRUEPLUS INSULIN SYRINGE) 30G X 5/16" 1 ML MISC Use as directed 100 each 2  . pregabalin (LYRICA) 100 MG capsule Take 1 capsule (100 mg total) by mouth 3 (three) times daily. 270 capsule 1  . amitriptyline (ELAVIL) 100 MG tablet Take 1 tablet (100 mg total) by mouth at bedtime. 30 tablet 5  . atorvastatin (LIPITOR) 40 MG tablet Take 1 tablet (40 mg total) by mouth daily. 30 tablet 5  . DULoxetine (CYMBALTA) 60 MG capsule Take 1 capsule (60 mg total) by mouth daily. 30 capsule 5  . insulin NPH-regular Human (HUMULIN 70/30) (70-30) 100 UNIT/ML injection Inject subcutaneously twice daily 55 units before breakfast and 60 units dinner 90 mL 6  . lisinopril (PRINIVIL,ZESTRIL) 20 MG tablet Take 1 tablet (20 mg total) by mouth daily. 30 tablet 5  . metFORMIN (GLUCOPHAGE) 1000 MG tablet TAKE 1 TABLET BY MOUTH TWICE DAILY WITH A MEAL 60 tablet  6  . metoprolol tartrate (LOPRESSOR) 50 MG tablet Take 1 tablet (50 mg total) by mouth 2 (two) times daily. 60 tablet 5  . oxybutynin (DITROPAN XL) 10 MG 24 hr tablet Take 1 tablet (10 mg total) by mouth at bedtime. 30 tablet 6  . cetirizine (ZYRTEC) 10 MG tablet Take 1 tablet (10 mg total) by mouth daily. (Patient not taking: Reported on 08/24/2017) 30 tablet 1  . furosemide (LASIX) 20 MG tablet TAKE 1 TABLET (20 MG TOTAL) BY MOUTH DAILY. (Patient not taking: Reported on 05/01/2018) 30 tablet 0  . guaiFENesin (MUCINEX) 600 MG 12 hr tablet Take 1 tablet (600 mg total) by mouth 2 (two) times  daily. (Patient not taking: Reported on 04/13/2017) 20 tablet 0  . silver sulfADIAZINE (SILVADENE) 1 % cream Apply 1 application topically 2 (two) times daily. (Patient not taking: Reported on 05/01/2018) 85 g 1   No facility-administered medications prior to visit.      ROS Review of Systems General: negative for fever, weight loss, appetite change Eyes: no visual symptoms. ENT: no ear symptoms, no sinus tenderness, no nasal congestion or sore throat. Neck: no pain  Respiratory: no wheezing, shortness of breath, cough Cardiovascular: no chest pain, no dyspnea on exertion, no pedal edema, no orthopnea. Gastrointestinal: no abdominal pain, no diarrhea, no constipation Genito-Urinary: no urinary frequency, no dysuria, no polyuria. Hematologic: no bruising Endocrine: no cold or heat intolerance Neurological: no headaches, no seizures, no tremors, + paresthesia Musculoskeletal: no joint pains, no joint swelling Skin: no pruritus, no rash. Psychological: + depression, no anxiety,    Objective:  BP (!) 169/92   Pulse 77   Temp 98.2 F (36.8 C) (Oral)   Ht _0  (1.626 m)   Wt 200 lb 6.4 oz (90.9 kg)   SpO2 97%   BMI 34.40 kg/m   BP/Weight 05/01/2018 09/06/2017 04/16/5425  Systolic BP 062 376 283  Diastolic BP 92 74 82  Wt. (Lbs) 200.4 200 202.6  BMI 34.4 34.33 34.78      Physical Exam Constitutional: normal appearing,  Eyes: PERRLA HEENT: Head is atraumatic, normal sinuses, normal oropharynx, normal appearing tonsils and palate, tympanic membrane is normal bilaterally. Neck: normal range of motion, no thyromegaly, no JVD Cardiovascular: normal rate and rhythm, normal heart sounds, no murmurs, rub or gallop, no pedal edema Respiratory: Normal breath sounds, clear to auscultation bilaterally, no wheezes, no rales, no rhonchi Abdomen: soft, not tender to palpation, normal bowel sounds, no enlarged organs Musculoskeletal: Full ROM, no tenderness in joints Skin: warm and dry, no  lesions. Neurological: alert, oriented x3, cranial nerves I-XII grossly intact , normal motor strength, normal sensation. Psychological: dysphoric mood.   CMP Latest Ref Rng & Units 04/19/2017 02/25/2017 08/24/2016  Glucose 65 - 99 mg/dL 321(H) 168(H) 247(H)  BUN 6 - 24 mg/dL _1 Creatinine 0.57 - 1.00 mg/dL 0.63 0.50 0.53(L)  Sodium 134 - 144 mmol/L 137 138 136  Potassium 3.5 - 5.2 mmol/L 4.7 4.0 4.6  Chloride 96 - 106 mmol/L 98 103 100  CO2 20 - 29 mmol/L 22 - 18(L)  Calcium 8.7 - 10.2 mg/dL 9.0 - 9.9  Total Protein 6.0 - 8.5 g/dL 6.9 - 6.9  Total Bilirubin 0.0 - 1.2 mg/dL 0.2 - <0.2  Alkaline Phos 39 - 117 IU/L 118(H) - 95  AST 0 - 40 IU/L 18 - 14  ALT 0 - 32 IU/L 33(H) - 19    Lipid Panel     Component Value  Date/Time   CHOL 117 04/19/2017 0843   TRIG 285 (H) 04/19/2017 0843   HDL 34 (L) 04/19/2017 0843   CHOLHDL 3.4 04/19/2017 0843   CHOLHDL 6.8 02/10/2016 1844   VLDL UNABLE TO CALCULATE IF TRIGLYCERIDE OVER 400 mg/dL 02/10/2016 1844   LDLCALC 26 04/19/2017 0843    CBC    Component Value Date/Time   WBC 7.9 02/12/2016 0408   RBC 3.75 (L) 02/12/2016 0408   HGB 13.3 02/25/2017 1113   HCT 39.0 02/25/2017 1113   PLT 207 02/12/2016 0408   MCV 87.2 02/12/2016 0408   MCH 28.8 02/12/2016 0408   MCHC 33.0 02/12/2016 0408   RDW 14.4 02/12/2016 0408   LYMPHSABS 1.5 02/11/2016 0600   MONOABS 1.0 02/11/2016 0600   EOSABS 0.1 02/11/2016 0600   BASOSABS 0.0 02/11/2016 0600    Lab Results  Component Value Date   HGBA1C 8.9 (A) 05/01/2018    Assessment & Plan:   1. 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  Dispense: 30 tablet; Refill: 6 - metFORMIN (GLUCOPHAGE) 1000 MG tablet; TAKE 1 TABLET BY MOUTH TWICE DAILY WITH A MEAL  Dispense: 60 tablet; Refill: 6 - insulin NPH-regular Human (HUMULIN 70/30) (70-30) 100 UNIT/ML injection; Inject subcutaneously twice daily 55 units before breakfast and 60 units dinner  Dispense: 90 mL; Refill: 6 - CMP14+EGFR; Future - Lipid panel; Future - Microalbumin / creatinine urine ratio; Future  2. Stress incontinence of urine Stable - oxybutynin (DITROPAN XL) 10 MG 24 hr tablet; Take 1 tablet (10 mg total) by mouth at bedtime.  Dispense: 30 tablet; Refill: 6  3. Other insomnia Controlled - amitriptyline (ELAVIL) 100 MG tablet; Take 1 tablet (100 mg total) by mouth at bedtime.  Dispense: 30 tablet; Refill: 6  4. Other diabetic neurological complication associated with type 2 diabetes mellitus (HCC) Uncontrolled on Lyrica and Cymbalta Trial of tramadol - DULoxetine (CYMBALTA) 60 MG capsule; Take 1 capsule (60 mg total) by mouth daily.  Dispense: 30 capsule; Refill: 6 - traMADol (ULTRAM) 50 MG tablet; Take 1 tablet (50 mg total) by mouth at bedtime.  Dispense: 30 tablet; Refill: 0  5. Essential hypertension Uncontrolled-out of medications which I have refilled - metoprolol tartrate (LOPRESSOR) 50 MG tablet; Take 1 tablet (50 mg total) by mouth 2 (two) times daily.  Dispense: 60 tablet; Refill: 6 - lisinopril (PRINIVIL,ZESTRIL) 20 MG tablet; Take 1 tablet (20 mg total) by mouth daily.  Dispense: 30 tablet; Refill: 6  6. Healthcare maintenance Needs mammogram and colonoscopy-advised to apply for the [facility] financial discount to facilitate this referral  7.  Depression Currently on Cymbalta Declines counseling; informed to reach out in the event that she changes her mind.  Meds ordered this encounter  Medications  . oxybutynin (DITROPAN  XL) 10 MG 24 hr tablet    Sig: Take 1 tablet (10 mg total) by mouth at bedtime.    Dispense:  30 tablet    Refill:  6    Discontinue 5 mg  . amitriptyline (ELAVIL) 100 MG tablet    Sig: Take  1 tablet (100 mg total) by mouth at bedtime.    Dispense:  30 tablet    Refill:  6  . atorvastatin (LIPITOR) 40 MG tablet    Sig: Take 1 tablet (40 mg total) by mouth daily.    Dispense:  30 tablet    Refill:  6  . DULoxetine (CYMBALTA) 60 MG capsule    Sig: Take 1 capsule (60 mg total) by mouth daily.    Dispense:  30 capsule    Refill:  6  . metoprolol tartrate (LOPRESSOR) 50 MG tablet    Sig: Take 1 tablet (50 mg total) by mouth 2 (two) times daily.    Dispense:  60 tablet    Refill:  6  . metFORMIN (GLUCOPHAGE) 1000 MG tablet    Sig: TAKE 1 TABLET BY MOUTH TWICE DAILY WITH A MEAL    Dispense:  60 tablet    Refill:  6  . lisinopril (PRINIVIL,ZESTRIL) 20 MG tablet    Sig: Take 1 tablet (20 mg total) by mouth daily.    Dispense:  30 tablet    Refill:  6  . traMADol (ULTRAM) 50 MG tablet    Sig: Take 1 tablet (50 mg total) by mouth at bedtime.    Dispense:  30 tablet    Refill:  0  . insulin NPH-regular Human (HUMULIN 70/30) (70-30) 100 UNIT/ML injection    Sig: Inject subcutaneously twice daily 55 units before breakfast and 60 units dinner    Dispense:  90 mL    Refill:  6    Discontinue previous dose    Follow-up: Return in about 3 months (around 07/30/2018) for follow up of chronic medical conditions.       [name], MD, FAAFP. [facility], [place]   05/01/2018, 10:29 AM

## 2018-05-01 NOTE — Progress Notes (Signed)
Patient is having burning hands and feet.

## 2018-05-16 MED FILL — METOPROLOL TARTRATE 50 MG T: 50 | 30 days supply | Qty: 60 | Fill #4

## 2018-05-16 MED FILL — metFORMIN HCL 1000 MG TABS: 1000 | 30 days supply | Qty: 60 | Fill #4

## 2018-05-16 MED FILL — DULoxetine HCL 60 MG CPEP: 60 | 30 days supply | Qty: 30 | Fill #3

## 2018-05-16 MED FILL — ATORVASTATIN CALCIUM 40 MG: 40 | 30 days supply | Qty: 30 | Fill #1

## 2018-05-30 ENCOUNTER — Other Ambulatory Visit: Payer: Self-pay

## 2018-05-30 DIAGNOSIS — E1149 Type 2 diabetes mellitus with other diabetic neurological complication: Secondary | ICD-10-CM

## 2018-05-30 MED ORDER — PREGABALIN 100 MG PO CAPS: 100.0000 mg | ORAL_CAPSULE | Freq: Three times a day (TID) | ORAL | 1 refills | Status: DC

## 2018-06-01 MED FILL — AMITRIPTYLINE HCL 100 MG TA: 100 | 30 days supply | Qty: 30 | Fill #1

## 2018-06-01 MED FILL — LISINOPRIL 20 MG TAB: 20 | 30 days supply | Qty: 30 | Fill #4

## 2018-06-28 IMAGING — US US RENAL
1 series · 14 of 25 positions shown · non-contrast
Comparison: None.

CLINICAL DATA: Pyelonephritis, back pain, nausea, body aches and
fever for 2 days. History of diabetes and hypertension.

EXAM:
RENAL / URINARY TRACT ULTRASOUND COMPLETE

[Series 1: us renal · 0.23mm/px · 14 of 32 slices shown]
[im 1/32]
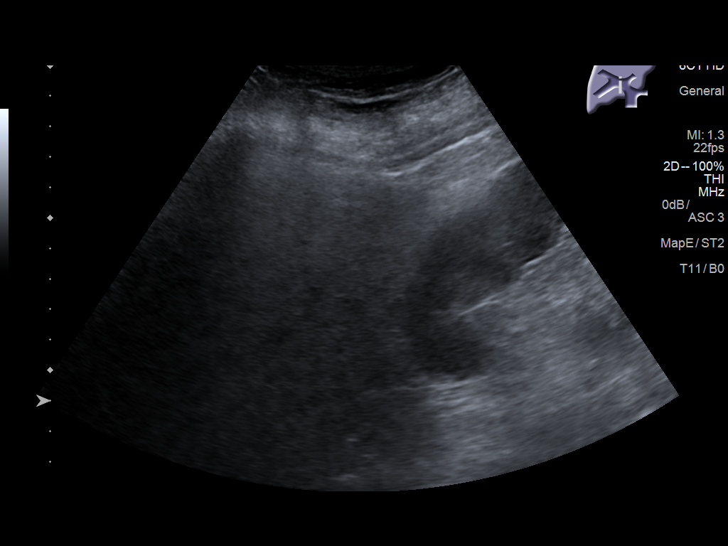
[im 3/32]
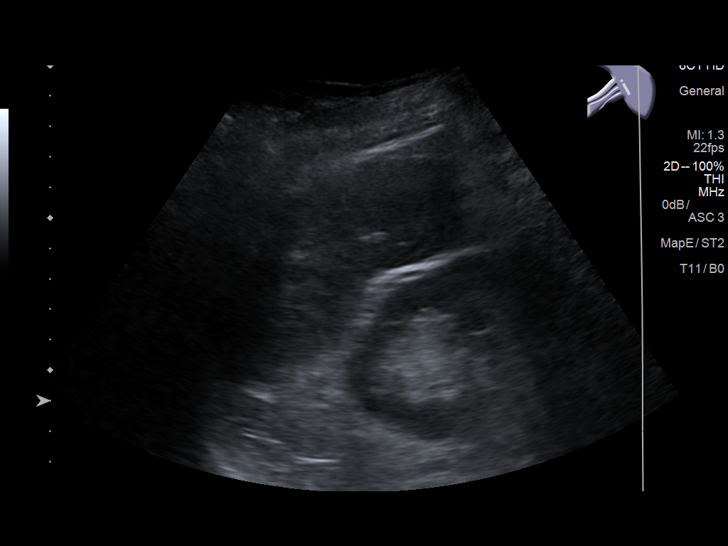
[im 6/32]
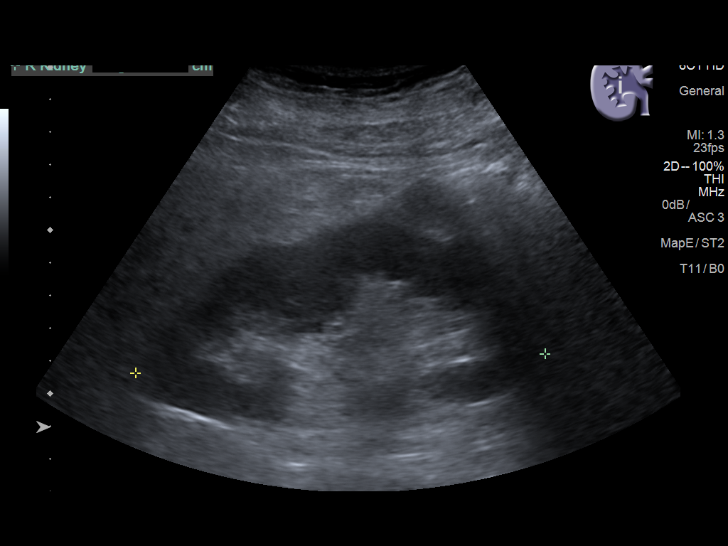
[im 8/32]
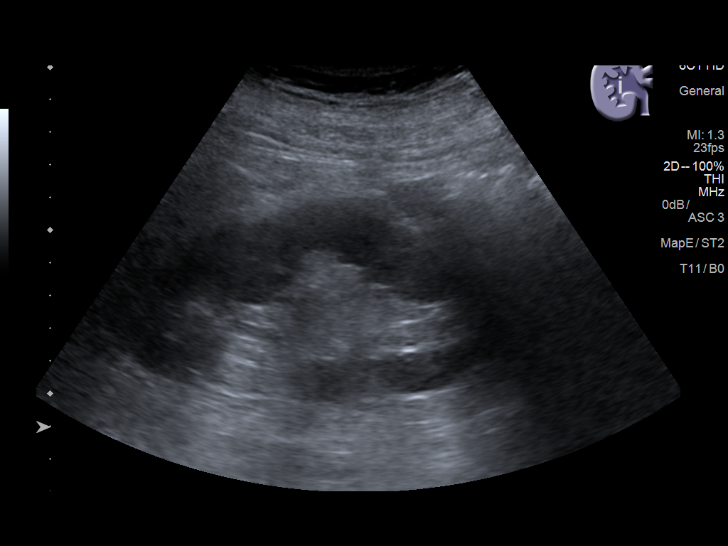
[im 11/32]
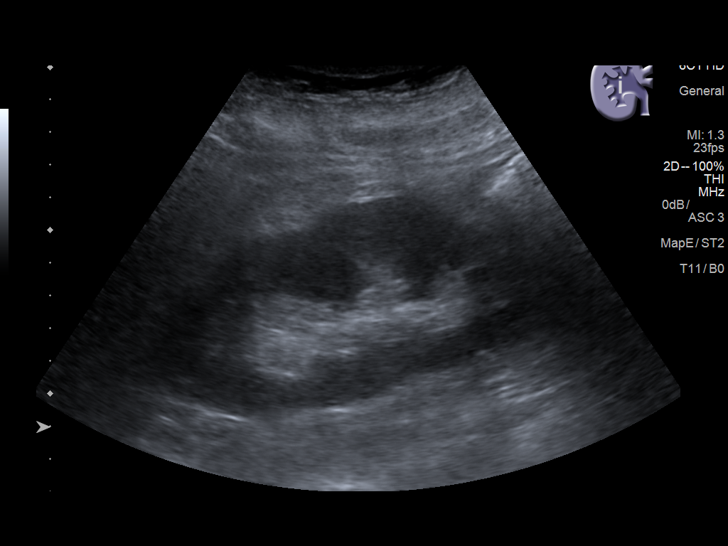
[im 12/32]
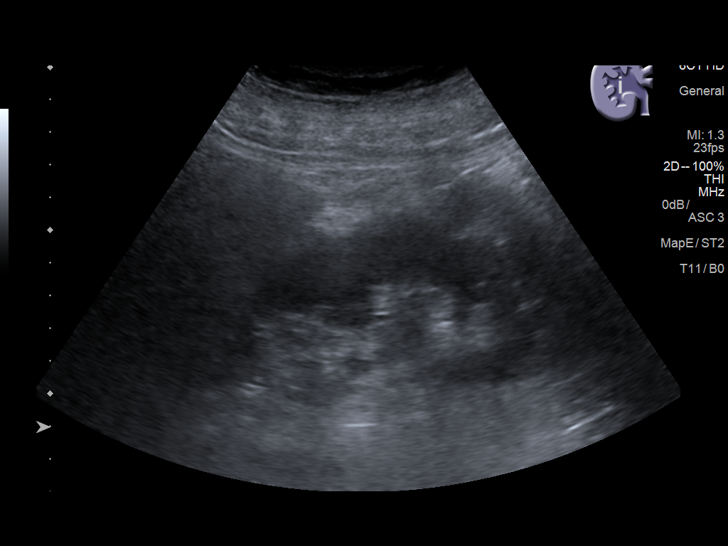
[im 15/32]
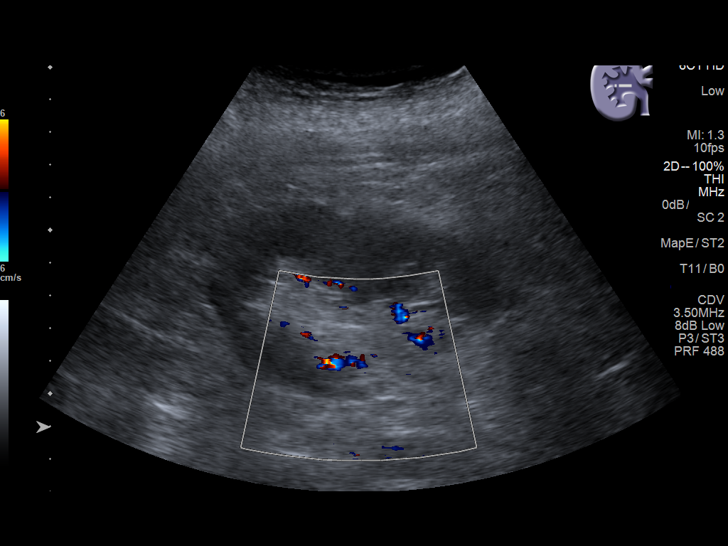
[im 17/32]
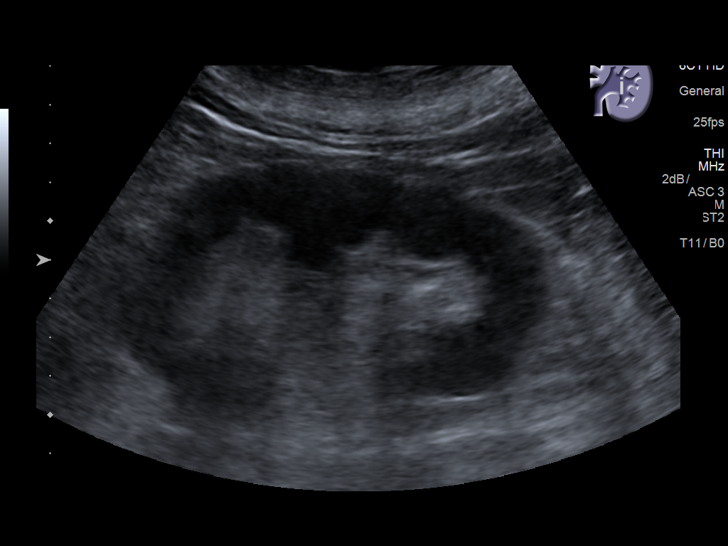
[im 20/32]
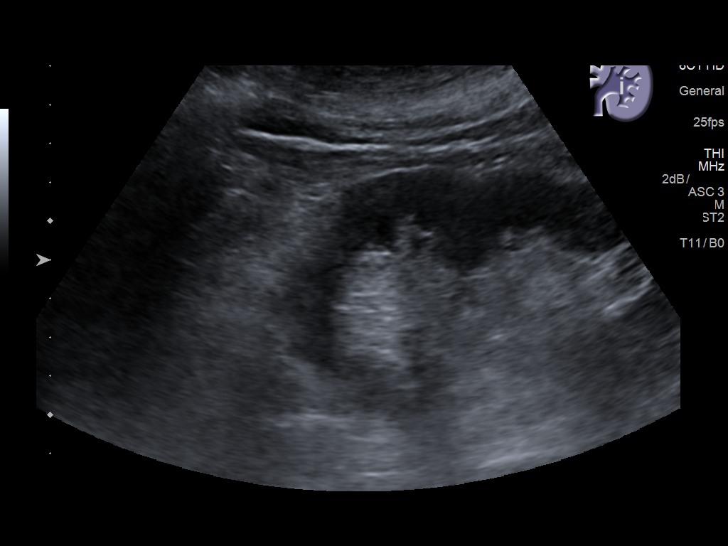
[im 21/32]
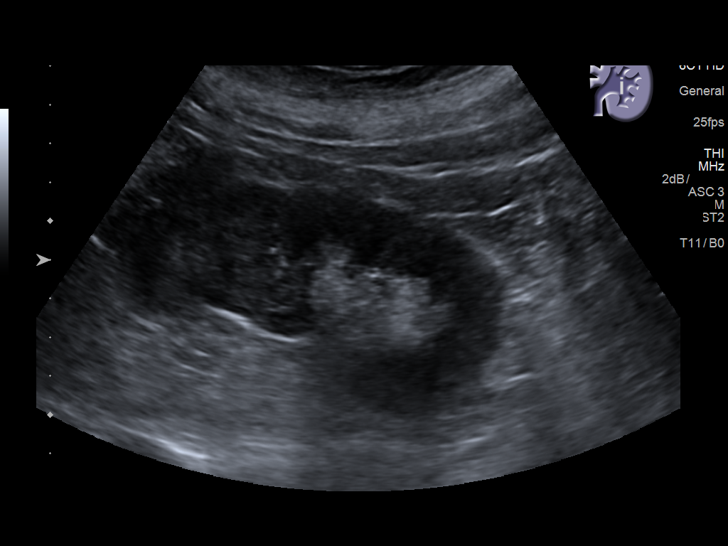
[im 24/32]
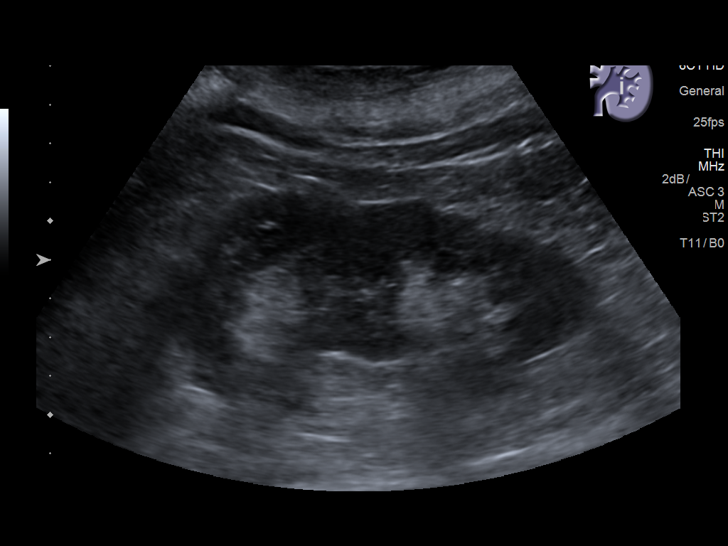
[im 26/32]
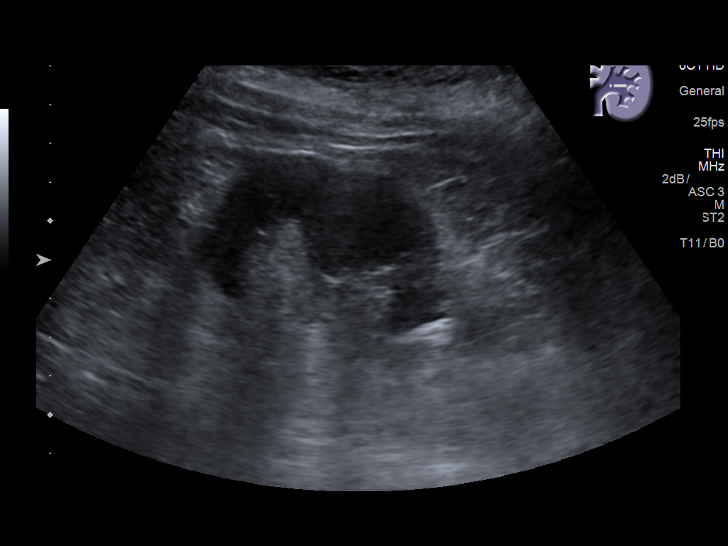
[im 29/32]
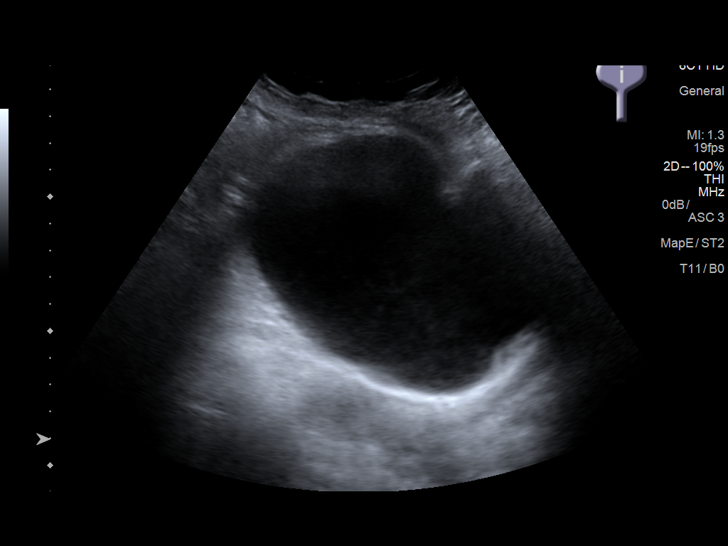
[im 32/32]
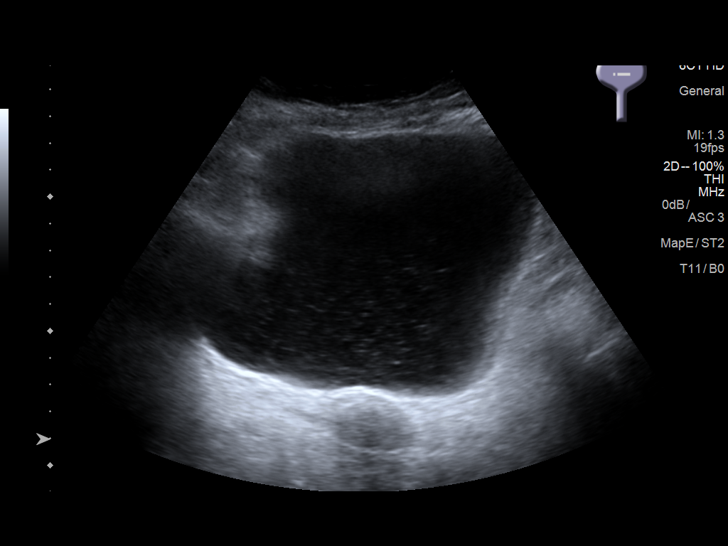

[14 of 25 positions shown; findings below may reference images not displayed]

FINDINGS: Right Kidney:

Length: 12.5 cm. Echogenicity within normal limits. No mass or
hydronephrosis visualized.

Left Kidney:

Length: 11.1 cm. Echogenicity within normal limits. No mass or
hydronephrosis visualized.

Bladder:

Urinary bladder is well distended, punctate echogenic foci in the
bladder suggests debris.
IMPRESSION: Suspected debris within the urinary bladder. Otherwise negative
renal ultrasound.

## 2018-09-05 NOTE — Progress Notes (Addendum)
[facility] Note  09/06/2018     CHIEF COMPLAINT Patient presents for Retina Evaluation   HISTORY OF PRESENT ILLNESS: Anna Wagner is a 58 y.o. female who presents to the clinic today for:   HPI    Retina Evaluation    In both eyes.  This started 1 year ago.  Duration of 1 year.  Associated Symptoms Floaters, Blind Spot and Distortion.  Context:  distance vision, mid-range vision, near vision, reading and watching TV.  Treatments tried include artificial tears and laser.  Response to treatment was no improvement.  I, the attending physician,  performed the HPI with the patient and updated documentation appropriately.          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  Last seen by Dr. Noel Journey about 1 yr ago.  Has not been seen anywhere since due to lack of insurance.  Had laser on both eyes about 1 yr ago.  VA OD very poor; only sees a "blur of color."  VA OS blurred as well, but not nearly as bad as OD.  Denies pain, flashes, but has floaters OU.  VA OD has been rapidly declining since October of 2019.  Systane prn OU.  BS 182 this a.m.  A1C 10.1 05.01.19.       Last edited by [name], MD on 09/06/2018  9:12 AM. (History)    pt used to be a pt of Dr. Noel Journey at [facility], she states her right eye has been giving her problems for the past couple of months, she states she has cataracts, but was told that is the least of her problems, she states she has had laser in both eyes and was told she needs injections in them as well  Referring physician: [name], MD [place],  [place] [address]  HISTORICAL INFORMATION:   Selected notes from the [place] Former pt of Dr. Satira Mccallum LEE: 11.15.19 (N. Marchase) [[facility]: OD: 20/60 OS: 20/30] Ocular Hx-NPDR OS, PDR OD, vitreous heme, cataracts OU (pt received IVL OU and PRP OU on 11.15.19)  PMH-DM (A1c: 10.1 05.01.19, takes Humulin and  metformin), arthritis, depression, HTN, HLD, neuropathy   CURRENT MEDICATIONS: No current outpatient medications on file. (Ophthalmic Drugs)   No current facility-administered medications for this visit.  (Ophthalmic Drugs)   Current Outpatient Medications (Other)  Medication Sig  . acetaminophen (TYLENOL) 500 MG tablet Take 1,000 mg by mouth every 6 (six) hours as needed for fever.  Marland Kitchen amitriptyline (ELAVIL) 100 MG tablet Take 1 tablet (100 mg total) by mouth at bedtime.  Marland Kitchen aspirin EC 81 MG tablet Take 81 mg by mouth at bedtime.   Marland Kitchen atorvastatin (LIPITOR) 40 MG tablet Take 1 tablet (40 mg total) by mouth daily.  . cephALEXin (KEFLEX) 500 MG capsule Take 1 capsule (500 mg total) by mouth 2 (two) times daily.  . cetirizine (ZYRTEC) 10 MG tablet Take 1 tablet (10 mg total) by mouth daily. (Patient not taking: Reported on 08/24/2017)  . clotrimazole (LOTRIMIN) 1 % cream Apply 1 application topically 2 (two) times daily.  . Cyanocobalamin (VITAMIN B-12 PO) Take 1 tablet by mouth 2 (two) times daily.  . DULoxetine (CYMBALTA) 60 MG capsule Take 1 capsule (60 mg total) by mouth daily.  . furosemide (LASIX) 20 MG tablet TAKE 1 TABLET (20 MG TOTAL) BY MOUTH DAILY. (Patient not taking: Reported on  05/01/2018)  . guaiFENesin (MUCINEX) 600 MG 12 hr tablet Take 1 tablet (600 mg total) by mouth 2 (two) times daily. (Patient not taking: Reported on 04/13/2017)  . insulin NPH-regular Human (HUMULIN 70/30) (70-30) 100 UNIT/ML injection Inject subcutaneously twice daily 55 units before breakfast and 60 units dinner  . Insulin Syringe-Needle U-100 (TRUEPLUS INSULIN SYRINGE) 30G X 5/16" 1 ML MISC Use as directed  . lisinopril (PRINIVIL,ZESTRIL) 20 MG tablet Take 1 tablet (20 mg total) by mouth daily.  . metFORMIN (GLUCOPHAGE) 1000 MG tablet TAKE 1 TABLET BY MOUTH TWICE DAILY WITH A MEAL  . metoprolol tartrate (LOPRESSOR) 50 MG tablet Take 1 tablet (50 mg total) by mouth 2 (two) times daily.  Marland Kitchen oxybutynin (DITROPAN  XL) 10 MG 24 hr tablet Take 1 tablet (10 mg total) by mouth at bedtime.  . pregabalin (LYRICA) 100 MG capsule Take 1 capsule (100 mg total) by mouth 3 (three) times daily.  . silver sulfADIAZINE (SILVADENE) 1 % cream Apply 1 application topically 2 (two) times daily. (Patient not taking: Reported on 05/01/2018)  . traMADol (ULTRAM) 50 MG tablet Take 1 tablet (50 mg total) by mouth at bedtime.   No current facility-administered medications for this visit.  (Other)      REVIEW OF SYSTEMS: ROS    Positive for: Genitourinary, Endocrine, Eyes   Negative for: Constitutional, Gastrointestinal, Neurological, Skin, Musculoskeletal, HENT, Cardiovascular, Respiratory, Psychiatric, Allergic/Imm, Heme/Lymph   Last edited by Celine Mans, COA on 09/06/2018  8:41 AM. (History)       ALLERGIES No Known Allergies  PAST MEDICAL HISTORY Past Medical History:  Diagnosis Date  . Bell's palsy 2005  . Cataract   . Diabetes mellitus without complication (HCC)   . Diabetic retinopathy (HCC)    PDR OD, NPDR OS  . Hypertension   . Neuromuscular disorder (HCC)    NEUROPATHY  . Neuropathy   . Sepsis (HCC) 02/11/2016   Past Surgical History:  Procedure Laterality Date  . AMPUTATION Left 03/15/2014   Procedure: AMPUTATION TRANSMETATARSAL;  Surgeon: Nadara Mustard, MD;  Location: [facility] OR;  Service: Orthopedics;  Laterality: Left;  . CESAREAN SECTION    . CHOLECYSTECTOMY      FAMILY HISTORY Family History  Problem Relation Age of Onset  . Heart attack Mother   . Diabetes Father   . Diabetes Sister     SOCIAL HISTORY Social History   Tobacco Use  . Smoking status: Current Every Day Smoker    Packs/day: 1.00    Years: 38.00    Pack years: 38.00    Types: Cigarettes    Last attempt to quit: 09/19/2013    Years since quitting: 4.9  . Smokeless tobacco: Never Used  Substance Use Topics  . Alcohol use: No  . Drug use: Yes    Types: Marijuana         OPHTHALMIC EXAM:  Base Eye Exam     Visual Acuity (Snellen - Linear)      Right Left   Dist [place] CF @ face 20/40 -2   Dist ph [place] NI NI       Tonometry (Tonopen, 8:43 AM)      Right Left   Pressure 8 12       Pupils      Dark Light Shape React APD   Right 4 3 Round Brisk None   Left 4 3 Round Brisk None       Visual Fields (Counting fingers)  Left Right    Full    Restrictions  Partial outer superior temporal, inferior temporal, superior nasal, inferior nasal deficiencies       Extraocular Movement      Right Left    Full, Ortho Full, Ortho       Neuro/Psych    Oriented x3: Yes   Mood/Affect: Normal       Dilation    Both eyes: 1.0% Mydriacyl, 2.5% Phenylephrine @ 8:43 AM        Slit Lamp and Fundus Exam    Slit Lamp Exam      Right Left   Lids/Lashes Dermatochalasis - upper lid Dermatochalasis - upper lid   Conjunctiva/Sclera nasal and temporal Pinguecula nasal and temporal Pinguecula   Cornea 2+ Punctate epithelial erosions, Debris in tear film 2+ Punctate epithelial erosions, Debris in tear film   Anterior Chamber Deep and quiet Deep and quiet   Iris Round and dilated, No NVI Round and dilated, No NVI   Lens 1-2+ Nuclear sclerosis, 2+ Cortical cataract 1-2+ Nuclear sclerosis, 2+ Cortical cataract   Vitreous Vitreous syneresis, RBCs, diffuse VH with blood clots settling inferiorly Vitreous syneresis       Fundus Exam      Right Left   Disc hazy view, no details visible, perfused Pink and Sharp   C/D Ratio  0.3   Macula no details visible Flat, decent foveal relfex, scattered MA, Retinal pigment epithelial mottling   Vessels attenuated Vascular attenuation, Tortuous, scattered NVE   Periphery Attached 360, hazy view due to VH, Scattered PRP laser visible 360 -- incomplete Attached, scattered IRH. sparse PRP 360        Refraction    Manifest Refraction      Sphere Cylinder Axis Dist VA   Right [place] Sphere  20/CF   Left -0.50 +0.50 010 20/40-2  Unable to improve VA OD w/lenses.           IMAGING AND PROCEDURES  Imaging and Procedures for @TODAY @  OCT, Retina - OU - Both Eyes       Right Eye Quality was poor.   Left Eye Quality was good. Central Foveal Thickness: 268. Progression has no prior data. Findings include normal foveal contour, no IRF, no SRF, outer retinal atrophy (Patchy ORA).   Notes *Images captured and stored on drive  Diagnosis / Impression:  OD: no image obtained OS: NFP, no IRF/SRF; patchy ORA  Clinical management:  See below  Abbreviations: NFP - Normal foveal profile. CME - cystoid macular edema. PED - pigment epithelial detachment. IRF - intraretinal fluid. SRF - subretinal fluid. EZ - ellipsoid zone. ERM - epiretinal membrane. ORA - outer retinal atrophy. ORT - outer retinal tubulation. SRHM - subretinal hyper-reflective material        Intravitreal Injection, Pharmacologic Agent - OD - Right Eye       Time Out 09/06/2018. 10:15 AM. Confirmed correct patient, procedure, site, and patient consented.   Anesthesia Topical anesthesia was used. Anesthetic medications included Lidocaine 2%, Proparacaine 0.5%.   Procedure Preparation included 5% betadine to ocular surface, eyelid speculum. A supplied needle was used.   Injection:  1.25 mg Bevacizumab (AVASTIN) SOLN   NDC: 16109-604-5450242-060-01, Lot: [id number] , Expiration date: 10/09/2018   Route: Intravitreal, Site: Right Eye, Waste: 0 mL  Post-op Post injection exam found visual acuity of at least counting fingers. The patient tolerated the procedure well. There were no complications. The patient received written and verbal post procedure care education.  Panretinal Photocoagulation - OS - Left Eye       LASER PROCEDURE NOTE  Diagnosis:   Proliferative Diabetic Retinopathy, LEFT EYE  Procedure:  Pan-retinal photocoagulation using slit lamp laser, LEFT EYE, fill-in  Anesthesia:  Topical  Surgeon: Rennis ChrisBrian Jovoni Borkenhagen, MD, PhD   Informed consent obtained, operative eye  marked, and time out performed prior to initiation of laser.   Lumenis [id number] slit lamp laser Pattern: 3x3 square Power: 300 mW Duration: 30 msec  Spot size: 200 microns  # spots: 2027 spots 360 fill-in  Complications: None.  Notes: significant cortical cataract obscuring view and preventing laser up take in focal peripheral areas  RTC: 2-3 wks  Patient tolerated the procedure well and received written and verbal post-procedure care information/education.         B-Scan Ultrasound - OD - Right Eye       Quality was good. Findings included vitreous hemorrhage, vitreous opacities.   Notes **Images stored on drive**  Impression: OD: vitreous opacities consistent with hemorrhage and PVD; no obvious RT/RD or mass                 ASSESSMENT/PLAN:    ICD-10-CM   1. 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 Retinal edema  H35.81 OCT, Retina - OU - Both Eyes  3. Vitreous hemorrhage of right eye (HCC)  H43.11 Intravitreal Injection, Pharmacologic Agent - OD - Right Eye    B-Scan Ultrasound - OD - Right Eye    Bevacizumab (AVASTIN) SOLN 1.25 mg  4. Essential hypertension  I10   5. Hypertensive retinopathy of both eyes  H35.033   6. Combined forms of age-related cataract of both eyes  H25.813     1,2. Proliferative diabetic retinopathy w/o DME, OU - [facility] - s/p PRP OU 11.15.19 -- has not been seen since then - pt presents with progressive vision loss OD since last visit with Dr. Robin SearingMarchase in 2019 - OD: diffuse VH - OS: scattered flat NV - The incidence, risk factors for progression, natural history and treatment options for diabetic retinopathy  were discussed with patient.   - The need for close monitoring of blood glucose, blood pressure, and serum lipids, avoiding cigarette or any type of tobacco, and the need for long term follow up was also discussed with patient. - OCT without diabetic macular edema, both eyes  - B-scan u/s 6.24.20 shows diffuse VH OD; no RT/RD or mass - discussed findings and prognosis - recommend PRP OS and IVA OD #1 - pt wishes to proceed with procedures today 6.24.2020 - RBA of procedure discussed, questions answered - informed consent obtained and signed - see procedure notes - start Lotemax SM QID OS x7 days - f/u in 2 wks for DFE/OCT and FA transit OS  3. Vitreous Hemorrhage OD  - secondary to PDR as above  - VH precautions reviewed -- minimize activities, keep head elevated, avoid ASA/NSAIDs/blood thinners as able  - recommed IVA OD #1 today, 06.24.20  - RBA of procedure discussed, questions answered  - informed consent obtained and signed  - see procedure note  - OD will need PRP once VH clears or PPV  if VH fails to clear  - monitor  4,5. Hypertensive retinopathy OU - discussed importance of tight BP control - monitor  6. Mixed form age related cataract OU - The symptoms of cataract, surgical options, and treatments and risks were discussed with patient. - discussed diagnosis and progression - not yet visually significant - monitor for now       Ophthalmic Meds Ordered this visit:  Meds ordered this encounter  Medications  . Bevacizumab (AVASTIN) SOLN 1.25 mg       Return in 2 weeks (on 09/20/2018) for s/p PRP OS; FA transit OS.  There are no Patient Instructions on file for this visit.   Explained the diagnoses, plan, and follow up with the patient and they expressed understanding.  Patient expressed understanding of the importance of proper follow up care.   This document serves as a record of services personally performed by Karie ChimeraBrian G. Jalicia Roszak, MD, PhD. It was created on  their behalf by Laurian BrimAmanda Brown, OA, an ophthalmic assistant. The creation of this record is the provider's dictation and/or activities during the visit.    Electronically signed by: Laurian BrimAmanda Brown, OA  06.23.2020 11:10 PM    Karie ChimeraBrian G. Zetta Stoneman, M.D., Ph.D. Diseases & Surgery of the Retina and Vitreous [facility]  I have reviewed the above documentation for accuracy and completeness, and I agree with the above. Karie ChimeraBrian G. Betina Puckett, M.D., Ph.D. 09/06/18 11:10 PM     Abbreviations: M myopia (nearsighted); A astigmatism; H hyperopia (farsighted); P presbyopia; Mrx spectacle prescription;  CTL contact lenses; OD right eye; OS left eye; OU both eyes  XT exotropia; ET esotropia; PEK punctate epithelial keratitis; PEE punctate epithelial erosions; DES dry eye syndrome; MGD meibomian gland dysfunction; ATs artificial tears; PFAT's preservative free artificial tears; NSC nuclear sclerotic cataract; PSC posterior subcapsular cataract; ERM epi-retinal membrane; PVD posterior vitreous detachment; RD retinal detachment; DM diabetes mellitus; DR diabetic retinopathy; NPDR non-proliferative diabetic retinopathy; PDR proliferative diabetic retinopathy; CSME clinically significant macular edema; DME diabetic macular edema; dbh dot blot hemorrhages; CWS cotton wool spot; POAG primary open angle glaucoma; C/D cup-to-disc ratio; HVF humphrey visual field; GVF goldmann visual field; OCT optical coherence tomography; IOP intraocular pressure; BRVO Branch retinal vein occlusion; CRVO central retinal vein occlusion; CRAO central retinal artery occlusion; BRAO branch retinal artery occlusion; RT retinal tear; SB scleral buckle; PPV pars plana vitrectomy; VH Vitreous hemorrhage; PRP panretinal laser photocoagulation; IVK intravitreal kenalog; VMT vitreomacular traction; MH Macular hole;  NVD neovascularization of the disc; NVE neovascularization elsewhere; AREDS age related eye disease study; ARMD age related  macular degeneration; POAG primary open angle glaucoma; EBMD epithelial/anterior basement membrane dystrophy; ACIOL anterior chamber intraocular lens; IOL intraocular lens; PCIOL posterior chamber intraocular lens; Phaco/IOL phacoemulsification with intraocular lens placement; PRK photorefractive keratectomy; LASIK laser assisted in situ keratomileusis; HTN hypertension; DM diabetes mellitus; COPD chronic obstructive pulmonary disease

## 2018-09-06 ENCOUNTER — Other Ambulatory Visit: Payer: Self-pay

## 2018-09-06 ENCOUNTER — Encounter (INDEPENDENT_AMBULATORY_CARE_PROVIDER_SITE_OTHER): Payer: Self-pay | Admitting: Ophthalmology

## 2018-09-06 ENCOUNTER — Ambulatory Visit (INDEPENDENT_AMBULATORY_CARE_PROVIDER_SITE_OTHER): Payer: Medicare (Managed Care) | Admitting: Ophthalmology

## 2018-09-06 DIAGNOSIS — H3581 Retinal edema: Secondary | ICD-10-CM | POA: Diagnosis not present

## 2018-09-06 DIAGNOSIS — E113593 Type 2 diabetes mellitus with proliferative diabetic retinopathy without macular edema, bilateral: Secondary | ICD-10-CM

## 2018-09-06 DIAGNOSIS — H4311 Vitreous hemorrhage, right eye: Secondary | ICD-10-CM | POA: Diagnosis not present

## 2018-09-06 DIAGNOSIS — I10 Essential (primary) hypertension: Secondary | ICD-10-CM | POA: Diagnosis not present

## 2018-09-06 DIAGNOSIS — H25813 Combined forms of age-related cataract, bilateral: Secondary | ICD-10-CM

## 2018-09-06 DIAGNOSIS — H35033 Hypertensive retinopathy, bilateral: Secondary | ICD-10-CM

## 2018-09-06 MED ORDER — BEVACIZUMAB CHEMO INJECTION 1.25MG/0.05ML SYRINGE FOR KALEIDOSCOPE
1.2500 mg | INTRAVITREAL | Status: AC | PRN
  Administered 2018-09-06: 1.25 mg via INTRAVITREAL

## 2018-09-18 NOTE — Progress Notes (Signed)
[facility] Note  09/19/2018     CHIEF COMPLAINT Patient presents for Post-op Follow-up   HISTORY OF PRESENT ILLNESS: Anna Wagner is a 58 y.o. female who presents to the clinic today for:   HPI    Post-op Follow-up    In left eye.  Discomfort includes itching.  Vision is improved.  I, the attending physician,  performed the HPI with the patient and updated documentation appropriately.          Comments    BS yesterday: 164 Last A1c: 8.8 Patient states vision is improving OU.  Patient denies eye pain.  Has some discomfort OU from itching.  Patient denies any new or worsening floaters or fol.       Last edited by [name], MD on 09/19/2018  2:40 PM. (History)      Referring physician: [name], MD [place],  [place] [address]  HISTORICAL INFORMATION:   Selected notes from the [facility] Former pt of Dr. Satira Mccallum LEE: 11.15.19 (N. Marchase) [BCVA: OD: 20/60 OS: 20/30] Ocular Hx-NPDR OS, PDR OD, vitreous heme, cataracts OU (pt received IVL OU and PRP OU on 11.15.19)  PMH-DM (A1c: 10.1 05.01.19, takes Humulin and metformin), arthritis, depression, HTN, HLD, neuropathy   CURRENT MEDICATIONS: No current outpatient medications on file. (Ophthalmic Drugs)   No current facility-administered medications for this visit.  (Ophthalmic Drugs)   Current Outpatient Medications (Other)  Medication Sig  . acetaminophen (TYLENOL) 500 MG tablet Take 1,000 mg by mouth every 6 (six) hours as needed for fever.  Marland Kitchen amitriptyline (ELAVIL) 100 MG tablet Take 1 tablet (100 mg total) by mouth at bedtime.  Marland Kitchen aspirin EC 81 MG tablet Take 81 mg by mouth at bedtime.   Marland Kitchen atorvastatin (LIPITOR) 40 MG tablet Take 1 tablet (40 mg total) by mouth daily.  . cephALEXin (KEFLEX) 500 MG capsule Take 1 capsule (500 mg total) by mouth 2 (two) times daily.  . cetirizine (ZYRTEC) 10 MG tablet Take 1 tablet (10 mg total) by mouth daily.  (Patient not taking: Reported on 08/24/2017)  . clotrimazole (LOTRIMIN) 1 % cream Apply 1 application topically 2 (two) times daily.  . Cyanocobalamin (VITAMIN B-12 PO) Take 1 tablet by mouth 2 (two) times daily.  . DULoxetine (CYMBALTA) 60 MG capsule Take 1 capsule (60 mg total) by mouth daily.  . furosemide (LASIX) 20 MG tablet TAKE 1 TABLET (20 MG TOTAL) BY MOUTH DAILY. (Patient not taking: Reported on 05/01/2018)  . guaiFENesin (MUCINEX) 600 MG 12 hr tablet Take 1 tablet (600 mg total) by mouth 2 (two) times daily. (Patient not taking: Reported on 04/13/2017)  . insulin NPH-regular Human (HUMULIN 70/30) (70-30) 100 UNIT/ML injection Inject subcutaneously twice daily 55 units before breakfast and 60 units dinner  . Insulin Syringe-Needle U-100 (TRUEPLUS INSULIN SYRINGE) 30G X 5/16" 1 ML MISC Use as directed  . lisinopril (PRINIVIL,ZESTRIL) 20 MG tablet Take 1 tablet (20 mg total) by mouth daily.  . metFORMIN (GLUCOPHAGE) 1000 MG tablet TAKE 1 TABLET BY MOUTH TWICE DAILY WITH A MEAL  . metoprolol tartrate (LOPRESSOR) 50 MG tablet Take 1 tablet (50 mg total) by mouth 2 (two) times daily.  Marland Kitchen oxybutynin (DITROPAN XL) 10 MG 24 hr tablet Take 1 tablet (10 mg total) by mouth at bedtime.  . pregabalin (LYRICA) 100 MG capsule Take 1 capsule (100 mg total) by mouth 3 (three) times daily.  . silver sulfADIAZINE (SILVADENE) 1 %  cream Apply 1 application topically 2 (two) times daily. (Patient not taking: Reported on 05/01/2018)  . traMADol (ULTRAM) 50 MG tablet Take 1 tablet (50 mg total) by mouth at bedtime.   No current facility-administered medications for this visit.  (Other)      REVIEW OF SYSTEMS: ROS    Positive for: Genitourinary, Endocrine, Eyes   Negative for: Constitutional, Gastrointestinal, Neurological, Skin, Musculoskeletal, HENT, Cardiovascular, Respiratory, Psychiatric, Allergic/Imm, Heme/Lymph   Last edited by Corrinne EagleEnglish, Ashley L on 09/19/2018  1:41 PM. (History)       ALLERGIES No  Known Allergies  PAST MEDICAL HISTORY Past Medical History:  Diagnosis Date  . Bell's palsy 2005  . Cataract   . Diabetes mellitus without complication (HCC)   . Diabetic retinopathy (HCC)    PDR OD, NPDR OS  . Hypertension   . Neuromuscular disorder (HCC)    NEUROPATHY  . Neuropathy   . Sepsis (HCC) 02/11/2016   Past Surgical History:  Procedure Laterality Date  . AMPUTATION Left 03/15/2014   Procedure: AMPUTATION TRANSMETATARSAL;  Surgeon: Nadara MustardMarcus Duda V, MD;  [facility];  Service: Orthopedics;  Laterality: Left;  . CESAREAN SECTION    . CHOLECYSTECTOMY      FAMILY HISTORY Family History  Problem Relation Age of Onset  . Heart attack Mother   . Diabetes Father   . Diabetes Sister     SOCIAL HISTORY Social History   Tobacco Use  . Smoking status: Current Every Day Smoker    Packs/day: 1.00    Years: 38.00    Pack years: 38.00    Types: Cigarettes    Last attempt to quit: 09/19/2013    Years since quitting: 5.0  . Smokeless tobacco: Never Used  Substance Use Topics  . Alcohol use: No  . Drug use: Yes    Types: Marijuana         OPHTHALMIC EXAM:  Base Eye Exam    Visual Acuity (Snellen - Linear)      Right Left   Dist [place] 20/100 +1 20/30 +1   Dist ph [place] NI 20/25 -2       Tonometry (Tonopen, 1:42 PM)      Right Left   Pressure 17 19       Pupils      Dark Light Shape React APD   Right 3 2 Round Brisk 0   Left 3 2 Round Brisk 0       Extraocular Movement      Right Left    Full Full       Neuro/Psych    Oriented x3: Yes   Mood/Affect: Normal       Dilation    Both eyes: 1.0% Mydriacyl, 2.5% Phenylephrine @ 1:42 PM        Slit Lamp and Fundus Exam    Slit Lamp Exam      Right Left   Lids/Lashes Dermatochalasis - upper lid Dermatochalasis - upper lid   Conjunctiva/Sclera nasal and temporal Pinguecula nasal and temporal Pinguecula   Cornea 2+ Punctate epithelial erosions, Debris in tear film 2+ Punctate epithelial erosions, Debris  in tear film   Anterior Chamber Deep and quiet Deep and quiet   Iris Round and dilated, No NVI Round and dilated, No NVI   Lens 1-2+ Nuclear sclerosis, 2+ Cortical cataract 1-2+ Nuclear sclerosis, 2+ Cortical cataract   Vitreous Vitreous syneresis, RBCs, diffuse VH mildly improved from prior Vitreous syneresis       Fundus Exam  Right Left   Disc hazy view, +NVD, +fibrosis Pink and Sharp   C/D Ratio 0.1 0.3   Macula obscured by VH Flat, decent foveal relfex, scattered MA, Retinal pigment epithelial mottling   Vessels attenuated Vascular attenuation, Tortuous, scattered NVE   Periphery Attached 360, hazy view, inferior quadrant obscured by VH, nasal, superior and temporal periphery attached with  Scattered PRP laser visible 360  Attached, scattered IRH. sparse PRP 360          IMAGING AND PROCEDURES  Imaging and Procedures for @TODAY @  OCT, Retina - OU - Both Eyes       Right Eye Quality was poor. Central Foveal Thickness: 416. Progression has improved. Findings include normal foveal contour, no IRF, no SRF, epiretinal membrane (Vitreous opacities).   Left Eye Quality was good. Central Foveal Thickness: 268. Progression has been stable. Findings include normal foveal contour, no IRF, no SRF, outer retinal atrophy, vitreomacular adhesion  (Patchy ORA).   Notes *Images captured and stored on drive  Diagnosis / Impression:  OD: poor image quality, but no obvious IRF/SRF; +ERM OS: NFP, no IRF/SRF; patchy ORA  Clinical management:  See below  Abbreviations: NFP - Normal foveal profile. CME - cystoid macular edema. PED - pigment epithelial detachment. IRF - intraretinal fluid. SRF - subretinal fluid. EZ - ellipsoid zone. ERM - epiretinal membrane. ORA - outer retinal atrophy. ORT - outer retinal tubulation. SRHM - subretinal hyper-reflective material        Fluorescein Angiography Optos (Transit OS)       Right Eye   Progression has no prior data. Early phase  findings include blockage, staining, vascular perfusion defect. Mid/Late phase findings include blockage, staining, leakage.   Left Eye   Progression has no prior data. Early phase findings include vascular perfusion defect, retinal neovascularization, staining, microaneurysm. Mid/Late phase findings include staining, leakage, retinal neovascularization, vascular perfusion defect, microaneurysm.   Notes Images stored on drive;   Impression: PDR OU OD: decreased [facility]; +late leakage OS: late leaking MA, vascular perfusion defect; +NV         Panretinal Photocoagulation - OS - Left Eye       LASER PROCEDURE NOTE  Diagnosis:   Proliferative Diabetic Retinopathy, LEFT EYE  Procedure:  Pan-retinal photocoagulation using slit lamp laser, LEFT EYE, fill-in  Anesthesia:  Topical  Surgeon: Rennis ChrisBrian Byrant Valent, MD, PhD   Informed consent obtained, operative eye marked, and time out performed prior to initiation of laser.   Lumenis [id number] slit lamp laser Pattern: 3x3 square Power: 320 mW Duration: 30 msec  Spot size: 200 microns  # spots: 618 spots 360 fill-in  Complications: None.  Notes: significant cortical cataract obscuring view and preventing laser up take in focal peripheral areas  RTC: 2-3 wks  Patient tolerated the procedure well and received written and verbal post-procedure care information/education.                  ASSESSMENT/PLAN:    ICD-10-CM   1. 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 Retinal edema  H35.81 OCT, Retina - OU - Both Eyes  3. Vitreous hemorrhage of right eye (HCC)  H43.11 Fluorescein Angiography Optos (Transit OS)  4. Essential hypertension  I10   5. Hypertensive retinopathy of both eyes  H35.033 Fluorescein Angiography Optos  (Transit OS)  6. Combined forms of age-related cataract of both eyes  H25.813     1,2. Proliferative diabetic retinopathy w/o DME, OU  - [facility]  - s/p PRP OU 11.15.19 -- has not been seen since then  - pt presented 6.24.20 with progressive vision loss OD since last visit with Dr. Robin SearingMarchase in 2019  - OD: diffuse VH  - OS: scattered flat NV  - OCT without diabetic macular edema, both eyes   - B-scan u/s 6.24.20 shows diffuse VH OD; no RT/RD or mass  - s/p PRP OS (06.24.20)  - s/p IVA OD #1 (06.24.20)  - today, BCVA improved OU -- OD 20/100 from CF; OS 20/25 from 20/40  - FA 7.7.20 shows blockage [facility], OS with leaking NV  - recommend PRP fill in OS today, 07.07.20 due to persistent NV and room for fill in  - pt wishes to proceed  - RBA of procedure discussed, questions answered  - informed consent obtained and signed  - see procedure note  - re-start Lotemax SM QID OS x7 days  - f/u in 2 wks for DFE/OCT/possible injection  3. Vitreous Hemorrhage OD  - secondary to PDR as above  - s/p IVA OD #1 06.24.20  - VH clearing and responding well to IVA  - VH precautions reviewed -- minimize activities, keep head elevated, avoid ASA/NSAIDs/blood thinners as able  - OD will need PRP once VH clears or PPV if VH fails to clear  - monitor  - f/u 2 wks -- DFE/OCT/possible injection  4,5. Hypertensive retinopathy OU  - discussed importance of tight BP control  - monitor  6. Mixed form age related cataract OU  - The symptoms of cataract, surgical options, and treatments and risks were discussed with patient.  - discussed diagnosis and progression  - not yet visually significant  - monitor for now   Ophthalmic Meds Ordered this visit:  No orders of the defined types were placed in this encounter.      Return in about 2 weeks (around 10/03/2018) for PDR OU, DFE, OCT.  There are no Patient Instructions on file for this  visit.   Explained the diagnoses, plan, and follow up with the patient and they expressed understanding.  Patient expressed understanding of the importance of proper follow up care.   This document serves as a record of services personally performed by Karie ChimeraBrian G. Jocelin Schuelke, MD, PhD. It was created on their behalf by Laurian BrimAmanda Brown, OA, an ophthalmic assistant. The creation of this record is the provider's dictation and/or activities during the visit.    Electronically signed by: Laurian BrimAmanda Brown, OA  07.06.2020 11:08 PM    Karie ChimeraBrian G. Tawonna Esquer, M.D., Ph.D. Diseases & Surgery of the Retina and Vitreous [facility]  I have reviewed the above documentation for accuracy and completeness, and I agree with the above. Karie ChimeraBrian G. Abbigale Mcelhaney, M.D., Ph.D. 09/19/18 11:13 PM    Abbreviations: M myopia (nearsighted); A astigmatism; H hyperopia (farsighted); P presbyopia; Mrx spectacle prescription;  CTL contact lenses; OD right eye; OS left eye; OU both eyes  XT exotropia; ET esotropia; PEK punctate epithelial keratitis; PEE punctate epithelial erosions; DES dry eye syndrome; MGD meibomian gland dysfunction; ATs artificial tears; PFAT's preservative free artificial tears; NSC nuclear sclerotic cataract; PSC posterior subcapsular cataract; ERM epi-retinal membrane; PVD posterior vitreous detachment; RD retinal detachment; DM diabetes mellitus; DR diabetic retinopathy; NPDR non-proliferative diabetic retinopathy; PDR proliferative diabetic  retinopathy; CSME clinically significant macular edema; DME diabetic macular edema; dbh dot blot hemorrhages; CWS cotton wool spot; POAG primary open angle glaucoma; C/D cup-to-disc ratio; HVF humphrey visual field; GVF goldmann visual field; OCT optical coherence tomography; IOP intraocular pressure; BRVO Branch retinal vein occlusion; CRVO central retinal vein occlusion; CRAO central retinal artery occlusion; BRAO branch retinal artery occlusion; RT retinal tear; SB scleral  buckle; PPV pars plana vitrectomy; VH Vitreous hemorrhage; PRP panretinal laser photocoagulation; IVK intravitreal kenalog; VMT vitreomacular traction; MH Macular hole;  NVD neovascularization of the disc; NVE neovascularization elsewhere; AREDS age related eye disease study; ARMD age related macular degeneration; POAG primary open angle glaucoma; EBMD epithelial/anterior basement membrane dystrophy; ACIOL anterior chamber intraocular lens; IOL intraocular lens; PCIOL posterior chamber intraocular lens; Phaco/IOL phacoemulsification with intraocular lens placement; PRK photorefractive keratectomy; LASIK laser assisted in situ keratomileusis; HTN hypertension; DM diabetes mellitus; COPD chronic obstructive pulmonary disease

## 2018-09-19 ENCOUNTER — Other Ambulatory Visit: Payer: Self-pay

## 2018-09-19 ENCOUNTER — Ambulatory Visit (INDEPENDENT_AMBULATORY_CARE_PROVIDER_SITE_OTHER): Payer: Medicare (Managed Care) | Admitting: Ophthalmology

## 2018-09-19 ENCOUNTER — Encounter (INDEPENDENT_AMBULATORY_CARE_PROVIDER_SITE_OTHER): Payer: Self-pay | Admitting: Ophthalmology

## 2018-09-19 DIAGNOSIS — H3581 Retinal edema: Secondary | ICD-10-CM | POA: Diagnosis not present

## 2018-09-19 DIAGNOSIS — E113593 Type 2 diabetes mellitus with proliferative diabetic retinopathy without macular edema, bilateral: Secondary | ICD-10-CM

## 2018-09-19 DIAGNOSIS — H4311 Vitreous hemorrhage, right eye: Secondary | ICD-10-CM

## 2018-09-19 DIAGNOSIS — H35033 Hypertensive retinopathy, bilateral: Secondary | ICD-10-CM

## 2018-09-19 DIAGNOSIS — I10 Essential (primary) hypertension: Secondary | ICD-10-CM | POA: Diagnosis not present

## 2018-09-19 DIAGNOSIS — H25813 Combined forms of age-related cataract, bilateral: Secondary | ICD-10-CM

## 2018-09-22 ENCOUNTER — Other Ambulatory Visit: Payer: Self-pay | Admitting: Family Medicine

## 2018-09-22 DIAGNOSIS — E1149 Type 2 diabetes mellitus with other diabetic neurological complication: Secondary | ICD-10-CM

## 2018-09-22 MED ORDER — PREGABALIN 100 MG PO CAPS: 100.0000 mg | ORAL_CAPSULE | Freq: Three times a day (TID) | ORAL | 1 refills | Status: DC

## 2018-10-03 NOTE — Progress Notes (Addendum)
[facility] Note  10/04/2018     CHIEF COMPLAINT Patient presents for Retina Follow Up   HISTORY OF PRESENT ILLNESS: Anna Wagner is a 58 y.o. female who presents to the clinic today for:   HPI    Retina Follow Up    Patient presents with  Diabetic Retinopathy.  In both eyes.  This started 1 month ago.  Severity is moderate.  Duration of 2 weeks.  Since onset it is gradually improving.  I, the attending physician,  performed the HPI with the patient and updated documentation appropriately.          Comments    58 y/o female pt here for 2 wk f/u for PDR OU w/VH OD.  VA OD gradually improving.  Seeing less floaters OD.  No change in [place] OS.  Denies pain, flashes.  Systane prn OU.       Last edited by [name], MD on 10/04/2018  3:52 PM. (History)    Patient states her vision is improving in the right eye--still slightly hazy but is noticing less floaters also.  Referring physician: [name], MD [place],  [place] [address]  HISTORICAL INFORMATION:   Selected notes from the MEDICAL RECORD NUMBER Referred by [facility] Former pt of Dr. Satira Mccallum LEE: 11.15.19 (N. Marchase) [BCVA: OD: 20/60 OS: 20/30] Ocular Hx-NPDR OS, PDR OD, vitreous heme, cataracts OU (pt received IVL OU and PRP OU on 11.15.19)  PMH-DM (A1c: 10.1 05.01.19, takes Humulin and metformin), arthritis, depression, HTN, HLD, neuropathy   CURRENT MEDICATIONS: Current Outpatient Medications (Ophthalmic Drugs)  Medication Sig  . LOTEMAX 0.5 % GEL APPLY 1 DROP TO AFFECTED EYE 4 TIMES A DAY FOR 7 DAYS   No current facility-administered medications for this visit.  (Ophthalmic Drugs)   Current Outpatient Medications (Other)  Medication Sig  . acetaminophen (TYLENOL) 500 MG tablet Take 1,000 mg by mouth every 6 (six) hours as needed for fever.  Marland Kitchen amitriptyline (ELAVIL) 100 MG tablet Take 1 tablet (100 mg total) by mouth at bedtime.  Marland Kitchen aspirin EC  81 MG tablet Take 81 mg by mouth at bedtime.   Marland Kitchen atorvastatin (LIPITOR) 40 MG tablet Take 1 tablet (40 mg total) by mouth daily.  . cephALEXin (KEFLEX) 500 MG capsule Take 1 capsule (500 mg total) by mouth 2 (two) times daily.  . cetirizine (ZYRTEC) 10 MG tablet Take 1 tablet (10 mg total) by mouth daily.  . clotrimazole (LOTRIMIN) 1 % cream Apply 1 application topically 2 (two) times daily.  . Cyanocobalamin (VITAMIN B-12 PO) Take 1 tablet by mouth 2 (two) times daily.  . DULoxetine (CYMBALTA) 60 MG capsule Take 1 capsule (60 mg total) by mouth daily.  . furosemide (LASIX) 20 MG tablet TAKE 1 TABLET (20 MG TOTAL) BY MOUTH DAILY.  Marland Kitchen guaiFENesin (MUCINEX) 600 MG 12 hr tablet Take 1 tablet (600 mg total) by mouth 2 (two) times daily.  . insulin NPH-regular Human (HUMULIN 70/30) (70-30) 100 UNIT/ML injection Inject subcutaneously twice daily 55 units before breakfast and 60 units dinner  . Insulin Syringe-Needle U-100 (TRUEPLUS INSULIN SYRINGE) 30G X 5/16" 1 ML MISC Use as directed  . lisinopril (PRINIVIL,ZESTRIL) 20 MG tablet Take 1 tablet (20 mg total) by mouth daily.  . metFORMIN (GLUCOPHAGE) 1000 MG tablet TAKE 1 TABLET BY MOUTH TWICE DAILY WITH A MEAL  . metoprolol tartrate (LOPRESSOR) 50 MG tablet Take 1 tablet (50 mg total) by  mouth 2 (two) times daily.  Marland Kitchen oxybutynin (DITROPAN XL) 10 MG 24 hr tablet Take 1 tablet (10 mg total) by mouth at bedtime.  . pregabalin (LYRICA) 100 MG capsule Take 1 capsule (100 mg total) by mouth 3 (three) times daily.  . silver sulfADIAZINE (SILVADENE) 1 % cream Apply 1 application topically 2 (two) times daily.  . traMADol (ULTRAM) 50 MG tablet Take 1 tablet (50 mg total) by mouth at bedtime.   No current facility-administered medications for this visit.  (Other)      REVIEW OF SYSTEMS: ROS    Positive for: Endocrine, Eyes   Negative for: Constitutional, Gastrointestinal, Neurological, Skin, Genitourinary, Musculoskeletal, HENT, Cardiovascular, Respiratory,  Psychiatric, Allergic/Imm, Heme/Lymph   Last edited by [name], COA on 10/04/2018  2:51 PM. (History)       ALLERGIES No Known Allergies  PAST MEDICAL HISTORY Past Medical History:  Diagnosis Date  . Bell's palsy 2005  . Cataract   . Diabetes mellitus without complication ([place])   . Diabetic retinopathy ([place])    PDR OU  . Hypertension   . Hypertensive retinopathy    OU  . Neuromuscular disorder ([place])    NEUROPATHY  . Neuropathy   . Sepsis ([place]) 02/11/2016   Past Surgical History:  Procedure Laterality Date  . AMPUTATION Left 03/15/2014   Procedure: AMPUTATION TRANSMETATARSAL;  Surgeon: Newt Minion, MD;  Location: [facility];  Service: Orthopedics;  Laterality: Left;  . CESAREAN SECTION    . CHOLECYSTECTOMY      FAMILY HISTORY Family History  Problem Relation Age of Onset  . Heart attack Mother   . Diabetes Father   . Diabetes Sister     SOCIAL HISTORY Social History   Tobacco Use  . Smoking status: Current Every Day Smoker    Packs/day: 1.00    Years: 38.00    Pack years: 38.00    Types: Cigarettes    Last attempt to quit: 09/19/2013    Years since quitting: 5.0  . Smokeless tobacco: Never Used  Substance Use Topics  . Alcohol use: No  . Drug use: Yes    Types: Marijuana         OPHTHALMIC EXAM:  Base Eye Exam    Visual Acuity (Snellen - Linear)      Right Left   Dist [place] 20/60 -2 20/30   Dist ph [place] NI 20/25 -2       Tonometry (Tonopen, 2:56 PM)      Right Left   Pressure 15 16       Pupils      Dark Light Shape React APD   Right 3 2 Round Brisk None   Left 3 2 Round Brisk None       Visual Fields (Counting fingers)      Left Right    Full Full       Extraocular Movement      Right Left    Full, Ortho Full, Ortho       Neuro/Psych    Oriented x3: Yes   Mood/Affect: Normal       Dilation    Both eyes: 1.0% Mydriacyl, 2.5% Phenylephrine @ 2:56 PM        Slit Lamp and Fundus Exam    Slit Lamp Exam      Right Left    Lids/Lashes Dermatochalasis - upper lid Dermatochalasis - upper lid   Conjunctiva/Sclera nasal and temporal Pinguecula nasal and temporal Pinguecula   Cornea 2+ Punctate  epithelial erosions, Debris in tear film 2+ Punctate epithelial erosions, Debris in tear film   Anterior Chamber Deep and quiet Deep and quiet   Iris Round and dilated, No NVI Round and dilated, No NVI   Lens 1-2+ Nuclear sclerosis, 2+ Cortical cataract 1-2+ Nuclear sclerosis, 2+ Cortical cataract   Vitreous Vitreous syneresis, RBCs, diffuse VH clearing and settling inferiorly Vitreous syneresis       Fundus Exam      Right Left   Disc hazy view, +NVD & +fibrosis regressing, +central heme Pink and Sharp   C/D Ratio 0.1 0.3   Macula Slightly hazy view, grossly flat, scattered MA, no edema Flat, decent foveal relfex, scattered MA, Retinal pigment epithelial mottling, cluster of IRH and NVE sup mac   Vessels Attenuated, +fibrosis and NV Vascular attenuation, Tortuous, scattered NVE   Periphery Attached 360, hazy view, inferior quadrant obscured by VH, nasal, superior and temporal periphery attached with  Scattered PRP laser visible 360  Attached, scattered IRH. sparse PRP 360--now with good fill in 360          IMAGING AND PROCEDURES  Imaging and Procedures for _0 @  OCT, Retina - OU - Both Eyes       Right Eye Quality was poor. Central Foveal Thickness: 248. Progression has improved. Findings include normal foveal contour, no IRF, no SRF, epiretinal membrane, vitreous traction (Interval improvement in vitreous opacities, tractional edema inferiorly caught on widefield scan).   Left Eye Quality was good. Central Foveal Thickness: 266. Progression has been stable. Findings include normal foveal contour, no IRF, no SRF, outer retinal atrophy, vitreomacular adhesion  (Patchy ORA).   Notes *Images captured and stored on drive  Diagnosis / Impression:  OD: NFP; no IRF/SRF; +ERM; tractional edema inferior periphery;  interval improvement in vitreous opacities OS: NFP, no IRF/SRF; patchy ORA  Clinical management:  See below  Abbreviations: NFP - Normal foveal profile. CME - cystoid macular edema. PED - pigment epithelial detachment. IRF - intraretinal fluid. SRF - subretinal fluid. EZ - ellipsoid zone. ERM - epiretinal membrane. ORA - outer retinal atrophy. ORT - outer retinal tubulation. SRHM - subretinal hyper-reflective material        Intravitreal Injection, Pharmacologic Agent - OD - Right Eye       Time Out 10/04/2018. 4:19 PM. Confirmed correct patient, procedure, site, and patient consented.   Anesthesia Topical anesthesia was used. Anesthetic medications included Lidocaine 2%, Proparacaine 0.5%.   Procedure Preparation included 5% betadine to ocular surface, eyelid speculum. A 30 gauge needle was used.   Injection:  1.25 mg Bevacizumab (AVASTIN) SOLN   NDC: 17510-258-52, Lot: [id number] , Expiration date: 11/22/2018   Route: Intravitreal, Site: Right Eye, Waste: 0 mL  Post-op Post injection exam found visual acuity of at least counting fingers. The patient tolerated the procedure well. There were no complications. The patient received written and verbal post procedure care education.                 ASSESSMENT/PLAN:    ICD-10-CM   1. 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 Retinal edema  H35.81 OCT, Retina - OU - Both Eyes  3. Vitreous hemorrhage of right eye ([place])  H43.11   4. Essential hypertension  I10   5. Hypertensive retinopathy of both eyes  H35.033   6. Combined  forms of age-related cataract of both eyes  H25.813     1,2. Proliferative diabetic retinopathy w/o DME, OU  - formerly pt of Dr. Noel Journey at [facility]  - s/p [place] 11.15.19 -- has not been seen since  then  - pt presented 6.24.20 with progressive vision loss OD since last visit with Dr. Noel Journey in 2019  - OD: diffuse VH  - OS: scattered flat NV  - OCT without diabetic macular edema, both eyes   - B-scan u/s 6.24.20 shows diffuse VH OD; no RT/RD or mass  - s/p PRP OS (06.24.20), fill in (07.07.20)  - s/p IVA OD #1 (06.24.20)  - FA 7.7.20 shows blockage OD from VH, OS with leaking NV  - today, BCVA improved OU -- OD 20/60 from CF; OS 20/25 from 20/40  - VH clearing OD, but significant heme persists  - recommend IVA OD #2 today, 07.22.20 for persistent VH OD  - pt wishes to proceed  - RBA of procedure discussed, questions answered  - informed consent obtained and signed  - see procedure note  - f/u in 2 wks -- DFE/OCT/ possible PRP fill in OD  3. Vitreous Hemorrhage OD  - secondary to PDR as above  - s/p IVA OD #1 06.24.20  - VH clearing and responding well to IVA  - VH precautions reviewed -- minimize activities, keep head elevated, avoid ASA/NSAIDs/blood thinners as able  - OD will need PRP once VH clears or PPV if VH fails to clear  - recommend IVA OD #2 today as above  - f/u 2 wks -- DFE/OCT/possible PRP fill in OD  4,5. Hypertensive retinopathy OU  - discussed importance of tight BP control  - monitor  6. Mixed form age related cataract OU  - The symptoms of cataract, surgical options, and treatments and risks were discussed with patient.  - discussed diagnosis and progression  - not yet visually significant  - monitor for now   Ophthalmic Meds Ordered this visit:  Meds ordered this encounter  Medications  . Bevacizumab (AVASTIN) SOLN 1.25 mg       Return in about 2 weeks (around 10/18/2018) for possible PRP OD.  There are no Patient Instructions on file for this visit.   Explained the diagnoses, plan, and follow up with the patient and they expressed understanding.  Patient expressed understanding of the importance of proper follow up care.   This document  serves as a record of services personally performed by Gardiner Sleeper, MD, PhD. It was created on their behalf by Ernest Mallick, OA, an ophthalmic assistant. The creation of this record is the provider's dictation and/or activities during the visit.    Electronically signed by: Ernest Mallick, OA 07.21.2020 5:05 PM     Gardiner Sleeper, M.D., Ph.D. Diseases & Surgery of the Retina and Vitreous [facility]  I have reviewed the above documentation for accuracy and completeness, and I agree with the above. Gardiner Sleeper, M.D., Ph.D. 10/04/18 5:05 PM     Abbreviations: M myopia (nearsighted); A astigmatism; H hyperopia (farsighted); P presbyopia; Mrx spectacle prescription;  CTL contact lenses; OD right eye; OS left eye; OU both eyes  XT exotropia; ET esotropia; PEK punctate epithelial keratitis; PEE punctate epithelial erosions; DES dry eye syndrome; MGD meibomian gland dysfunction; ATs artificial tears; PFAT's preservative free artificial tears; [place] nuclear sclerotic cataract; PSC posterior subcapsular cataract; ERM epi-retinal membrane; PVD posterior vitreous detachment; RD retinal detachment; DM  diabetes mellitus; DR diabetic retinopathy; NPDR non-proliferative diabetic retinopathy; PDR proliferative diabetic retinopathy; CSME clinically significant macular edema; DME diabetic macular edema; dbh dot blot hemorrhages; CWS cotton wool spot; POAG primary open angle glaucoma; C/D cup-to-disc ratio; HVF humphrey visual field; GVF goldmann visual field; OCT optical coherence tomography; IOP intraocular pressure; BRVO Branch retinal vein occlusion; CRVO central retinal vein occlusion; CRAO central retinal artery occlusion; BRAO branch retinal artery occlusion; RT retinal tear; SB scleral buckle; PPV pars plana vitrectomy; VH Vitreous hemorrhage; PRP panretinal laser photocoagulation; IVK intravitreal kenalog; VMT vitreomacular traction; MH Macular hole;  NVD neovascularization of the  disc; NVE neovascularization elsewhere; AREDS age related eye disease study; ARMD age related macular degeneration; POAG primary open angle glaucoma; EBMD epithelial/anterior basement membrane dystrophy; ACIOL anterior chamber intraocular lens; IOL intraocular lens; PCIOL posterior chamber intraocular lens; Phaco/IOL phacoemulsification with intraocular lens placement; [place] photorefractive keratectomy; LASIK laser assisted in situ keratomileusis; HTN hypertension; DM diabetes mellitus; COPD chronic obstructive pulmonary disease

## 2018-10-04 ENCOUNTER — Ambulatory Visit (INDEPENDENT_AMBULATORY_CARE_PROVIDER_SITE_OTHER): Payer: Medicare (Managed Care) | Admitting: Ophthalmology

## 2018-10-04 ENCOUNTER — Encounter (INDEPENDENT_AMBULATORY_CARE_PROVIDER_SITE_OTHER): Payer: Self-pay | Admitting: Ophthalmology

## 2018-10-04 ENCOUNTER — Other Ambulatory Visit: Payer: Self-pay

## 2018-10-04 DIAGNOSIS — H4311 Vitreous hemorrhage, right eye: Secondary | ICD-10-CM

## 2018-10-04 DIAGNOSIS — H3581 Retinal edema: Secondary | ICD-10-CM

## 2018-10-04 DIAGNOSIS — I10 Essential (primary) hypertension: Secondary | ICD-10-CM | POA: Diagnosis not present

## 2018-10-04 DIAGNOSIS — E113593 Type 2 diabetes mellitus with proliferative diabetic retinopathy without macular edema, bilateral: Secondary | ICD-10-CM

## 2018-10-04 DIAGNOSIS — H25813 Combined forms of age-related cataract, bilateral: Secondary | ICD-10-CM

## 2018-10-04 DIAGNOSIS — H35033 Hypertensive retinopathy, bilateral: Secondary | ICD-10-CM

## 2018-10-04 MED ORDER — BEVACIZUMAB CHEMO INJECTION 1.25MG/0.05ML SYRINGE FOR KALEIDOSCOPE
1.2500 mg | INTRAVITREAL | Status: AC | PRN
  Administered 2018-10-04: 1.25 mg via INTRAVITREAL

## 2018-10-18 NOTE — Progress Notes (Signed)
[facility] Note  10/19/2018     CHIEF COMPLAINT Patient presents for Retina Follow Up   HISTORY OF PRESENT ILLNESS: Anna Wagner is a 58 y.o. female who presents to the clinic today for:   HPI    Retina Follow Up    Patient presents with  Diabetic Retinopathy.  In both eyes.  Severity is moderate.  Duration of 2 weeks.  Course: fluctuates.  I, the attending physician,  performed the HPI with the patient and updated documentation appropriately.          Comments    Patient states vision fluctuates often. BS was 266 this am. Last a1c was around 9 about 3 months ago.        Last edited by [name], MD on 10/19/2018 10:16 AM. (History)    Patient states after her last visit she had a floater that lasted for a few days and then went away, she states some days her vision seems to be more cloudy than others  Referring physician: [name], MD [place],  [place] [address]  HISTORICAL INFORMATION:   Selected notes from the MEDICAL RECORD NUMBER Referred by [facility] Former pt of Dr. Satira Mccallum LEE: 11.15.19 (N. Marchase) [BCVA: OD: 20/60 OS: 20/30] Ocular Hx-NPDR OS, PDR OD, vitreous heme, cataracts OU (pt received IVL OU and PRP OU on 11.15.19)  PMH-DM (A1c: 10.1 05.01.19, takes Humulin and metformin), arthritis, depression, HTN, HLD, neuropathy   CURRENT MEDICATIONS: Current Outpatient Medications (Ophthalmic Drugs)  Medication Sig  . LOTEMAX 0.5 % GEL APPLY 1 DROP TO AFFECTED EYE 4 TIMES A DAY FOR 7 DAYS  . prednisoLONE acetate (PRED FORTE) 1 % ophthalmic suspension Place 1 drop into the right eye 4 (four) times daily.   No current facility-administered medications for this visit.  (Ophthalmic Drugs)   Current Outpatient Medications (Other)  Medication Sig  . acetaminophen (TYLENOL) 500 MG tablet Take 1,000 mg by mouth every 6 (six) hours as needed for fever.  Marland Kitchen amitriptyline (ELAVIL) 100 MG tablet Take 1  tablet (100 mg total) by mouth at bedtime.  Marland Kitchen aspirin EC 81 MG tablet Take 81 mg by mouth at bedtime.   Marland Kitchen atorvastatin (LIPITOR) 40 MG tablet Take 1 tablet (40 mg total) by mouth daily.  . cephALEXin (KEFLEX) 500 MG capsule Take 1 capsule (500 mg total) by mouth 2 (two) times daily.  . cetirizine (ZYRTEC) 10 MG tablet Take 1 tablet (10 mg total) by mouth daily.  . clotrimazole (LOTRIMIN) 1 % cream Apply 1 application topically 2 (two) times daily.  . Cyanocobalamin (VITAMIN B-12 PO) Take 1 tablet by mouth 2 (two) times daily.  . DULoxetine (CYMBALTA) 60 MG capsule Take 1 capsule (60 mg total) by mouth daily.  . furosemide (LASIX) 20 MG tablet TAKE 1 TABLET (20 MG TOTAL) BY MOUTH DAILY.  Marland Kitchen guaiFENesin (MUCINEX) 600 MG 12 hr tablet Take 1 tablet (600 mg total) by mouth 2 (two) times daily.  . insulin NPH-regular Human (HUMULIN 70/30) (70-30) 100 UNIT/ML injection Inject subcutaneously twice daily 55 units before breakfast and 60 units dinner  . Insulin Syringe-Needle U-100 (TRUEPLUS INSULIN SYRINGE) 30G X 5/16" 1 ML MISC Use as directed  . lisinopril (PRINIVIL,ZESTRIL) 20 MG tablet Take 1 tablet (20 mg total) by mouth daily.  . metFORMIN (GLUCOPHAGE) 1000 MG tablet TAKE 1 TABLET BY MOUTH TWICE DAILY WITH A MEAL  . metoprolol tartrate (LOPRESSOR) 50 MG tablet  Take 1 tablet (50 mg total) by mouth 2 (two) times daily.  Marland Kitchen oxybutynin (DITROPAN XL) 10 MG 24 hr tablet Take 1 tablet (10 mg total) by mouth at bedtime.  . pregabalin (LYRICA) 100 MG capsule Take 1 capsule (100 mg total) by mouth 3 (three) times daily.  . silver sulfADIAZINE (SILVADENE) 1 % cream Apply 1 application topically 2 (two) times daily.  . traMADol (ULTRAM) 50 MG tablet Take 1 tablet (50 mg total) by mouth at bedtime.   No current facility-administered medications for this visit.  (Other)      REVIEW OF SYSTEMS: ROS    Positive for: Endocrine, Eyes   Negative for: Constitutional, Gastrointestinal, Neurological, Skin,  Genitourinary, Musculoskeletal, HENT, Cardiovascular, Respiratory, Psychiatric, Allergic/Imm, Heme/Lymph   Last edited by Roselee Nova D on 10/19/2018  9:17 AM. (History)       ALLERGIES No Known Allergies  PAST MEDICAL HISTORY Past Medical History:  Diagnosis Date  . Bell's palsy 2005  . Cataract   . Diabetes mellitus without complication ([place])   . Diabetic retinopathy ([place])    PDR OU  . Hypertension   . Hypertensive retinopathy    OU  . Neuromuscular disorder ([place])    NEUROPATHY  . Neuropathy   . Sepsis ([place]) 02/11/2016   Past Surgical History:  Procedure Laterality Date  . AMPUTATION Left 03/15/2014   Procedure: AMPUTATION TRANSMETATARSAL;  Surgeon: Newt Minion, MD;  Location: [facility];  Service: Orthopedics;  Laterality: Left;  . CESAREAN SECTION    . CHOLECYSTECTOMY      FAMILY HISTORY Family History  Problem Relation Age of Onset  . Heart attack Mother   . Diabetes Father   . Diabetes Sister     SOCIAL HISTORY Social History   Tobacco Use  . Smoking status: Current Every Day Smoker    Packs/day: 1.00    Years: 38.00    Pack years: 38.00    Types: Cigarettes    Last attempt to quit: 09/19/2013    Years since quitting: 5.0  . Smokeless tobacco: Never Used  Substance Use Topics  . Alcohol use: No  . Drug use: Yes    Types: Marijuana         OPHTHALMIC EXAM:  Base Eye Exam    Visual Acuity (Snellen - Linear)      Right Left   Dist [place] 20/80 -1 20/50   Dist ph [place] 20/50 20/40 +1       Tonometry (Tonopen, 9:35 AM)      Right Left   Pressure 17 17       Pupils      Dark Light Shape React APD   Right 4 3 Round Brisk None   Left 4 3 Round Slow None       Visual Fields (Counting fingers)      Left Right    Full Full       Extraocular Movement      Right Left    Full, Ortho Full, Ortho       Neuro/Psych    Oriented x3: Yes   Mood/Affect: Normal       Dilation    Both eyes: 1.0% Mydriacyl, 2.5% Phenylephrine @ 9:36 AM        Slit  Lamp and Fundus Exam    Slit Lamp Exam      Right Left   Lids/Lashes Dermatochalasis - upper lid Dermatochalasis - upper lid   Conjunctiva/Sclera nasal and temporal Pinguecula nasal and temporal  Pinguecula   Cornea 3+ Punctate epithelial erosions, irregular tear film 2+ Punctate epithelial erosions, Debris in tear film   Anterior Chamber Deep and quiet Deep and quiet   Iris Round and dilated, No NVI Round and dilated, No NVI   Lens 2+ Nuclear sclerosis, 2+ Cortical cataract 1-2+ Nuclear sclerosis, 2+ Cortical cataract   Vitreous Vitreous syneresis, RBCs, diffuse VH clearing centrally and settling inferiorly, large pre-retinal blood clot inferior to the disc turning white Vitreous syneresis       Fundus Exam      Right Left   Disc Pink and Sharp Pink and Sharp   C/D Ratio 0.1 0.3   Macula Flat, Blunted foveal reflex, hazy view, scattered Microaneurysms Flat, decent foveal relfex, scattered MA, Retinal pigment epithelial mottling, cluster of IRH and NVE sup mac   Vessels Attenuated, +fibrosis and NV Vascular attenuation, Tortuous, scattered NVE   Periphery Attached 360, hazy view w/ inferior quadrant obscured by VH, scattered PRP, scattered IRH Attached, scattered IRH, good fill in PRP        Refraction    Manifest Refraction      Sphere Cylinder Axis Dist VA   Right -1.00 +0.75 055 20/40+1   Left -1.00 +0.50 168 20/30+2          IMAGING AND PROCEDURES  Imaging and Procedures for _0 @  OCT, Retina - OU - Both Eyes       Right Eye Quality was borderline. Central Foveal Thickness: 249. Progression has been stable. Findings include normal foveal contour, no IRF, no SRF, epiretinal membrane, vitreous traction (Interval improvement in vitreous opacities, tractional edema inferiorly caught on widefield scan, patchy ORA).   Left Eye Quality was good. Central Foveal Thickness: 269. Progression has been stable. Findings include normal foveal contour, no IRF, no SRF, outer retinal  atrophy, vitreomacular adhesion , retinal drusen  (Patchy ORA).   Notes *Images captured and stored on drive  Diagnosis / Impression:  OD: NFP; no IRF/SRF; +ERM; tractional edema inferior periphery; interval improvement in vitreous opacities OS: NFP, no IRF/SRF; patchy ORA  Clinical management:  See below  Abbreviations: NFP - Normal foveal profile. CME - cystoid macular edema. PED - pigment epithelial detachment. IRF - intraretinal fluid. SRF - subretinal fluid. EZ - ellipsoid zone. ERM - epiretinal membrane. ORA - outer retinal atrophy. ORT - outer retinal tubulation. SRHM - subretinal hyper-reflective material        Panretinal Photocoagulation - OD - Right Eye       LASER PROCEDURE NOTE  Diagnosis:   Proliferative Diabetic Retinopathy, right EYE  Procedure:  Pan-retinal photocoagulation using slit lamp laser, right EYE, fill-in  Anesthesia:  Topical  Surgeon: [name], MD, PhD   Informed consent obtained, operative eye marked, and time out performed prior to initiation of laser.   [place] [id number] slit lamp laser Pattern: 2x2 square Power: 300 mW Duration: 50 msec  Spot size: 200 microns  # spots: 903 spots fill-in -- mostly superior hemisphere  Complications: None.  Notes: significant vitreous heme obscuring view and preventing laser up take inferiorly and scattered focal areas  RTC: 2 wks DFE/OCT/possible injection  Patient tolerated the procedure well and received written and verbal post-procedure care information/education.                  ASSESSMENT/PLAN:    ICD-10-CM   1. Proliferative diabetic retinopathy of both eyes without macular edema associated with type 2 diabetes mellitus (HCC)  P71.0626 Panretinal Photocoagulation - OD - Right  Eye  2. Retinal edema  H35.81 OCT, Retina - OU - Both Eyes  3. Vitreous hemorrhage of right eye ([place])  H43.11   4. Essential hypertension  I10   5. Hypertensive retinopathy of both eyes  H35.033   6.  Combined forms of age-related cataract of both eyes  H25.813     1,2. Proliferative diabetic retinopathy w/o DME, OU  - formerly pt of Dr[facility] Noel [facility]  - s/p [place] 11.15.19 -- has not been seen since then  - pt presented 6.24.20 with progressive vision loss OD since last visit with Dr. Noel Journey in 2019  - OD: diffuse VH  - OS: scattered flat NV  - OCT without diabetic macular edema, both eyes   - B-scan u/s 6.24.20 shows diffuse VH OD; no RT/RD or mass  - s/p PRP OS (06.24.20), fill in (07.07.20)  - s/p IVA OD #1 (06.24.20), #2 (07.22.20)  - FA 7.7.20 shows blockage OD from VH, OS with leaking NV  - today, BCVA OD 20/50 from 20/60; OS 20/40 from 20/25  - VH clearing OD, but significant heme persists  - recommend PRP fill in OD today, 08.06.20  - pt wishes to proceed  - RBA of procedure discussed, questions answered  - informed consent obtained and signed  - see procedure note  - start PF QID OD x7 days  - f/u 2-3 weeks  3. Vitreous Hemorrhage OD  - secondary to PDR as above  - s/p IVA OD #1 06.24.20, #2 (07.22.20)  - VH clearing and responding well to IVA  - VH precautions reviewed -- minimize activities, keep head elevated, avoid ASA/NSAIDs/blood thinners as able  - recommend PRP fill in OD today, 08.06.20  - pt wishes to proceed  - RBA of procedure discussed, questions answered  - informed consent obtained and signed  - see procedure note  - start PF qid OD x7 days  - f/u 2-3 weeks  4,5. Hypertensive retinopathy OU  - discussed importance of tight BP control  - monitor  6. Mixed form age related cataract OU  - The symptoms of cataract, surgical options, and treatments and risks were discussed with patient.  - discussed diagnosis and progression  - not yet visually significant  - monitor for now   Ophthalmic Meds Ordered this visit:  Meds ordered this encounter  Medications  . prednisoLONE acetate (PRED FORTE) 1 % ophthalmic suspension     Sig: Place 1 drop into the right eye 4 (four) times daily.    Dispense:  10 mL    Refill:  0       Return for f/u 2-3 weeks f/u PDR OU, DFE, OCT.  There are no Patient Instructions on file for this visit.   Explained the diagnoses, plan, and follow up with the patient and they expressed understanding.  Patient expressed understanding of the importance of proper follow up care.   This document serves as a record of services personally performed by Gardiner Sleeper, MD, PhD. It was created on their behalf by Ernest Mallick, OA, an ophthalmic assistant. The creation of this record is the provider's dictation and/or activities during the visit.    Electronically signed by: Ernest Mallick, OA  08.05.2020 9:04 PM    Gardiner Sleeper, M.D., Ph.D. Diseases & Surgery of the Retina and Vitreous [facility]  I have reviewed the above documentation for accuracy and completeness, and I agree with the above. Sharyon Cable  Coralyn Pear, M.D., Ph.D. 10/19/18 9:04 PM    Abbreviations: M myopia (nearsighted); A astigmatism; H hyperopia (farsighted); P presbyopia; Mrx spectacle prescription;  CTL contact lenses; OD right eye; OS left eye; OU both eyes  XT exotropia; ET esotropia; PEK punctate epithelial keratitis; PEE punctate epithelial erosions; DES dry eye syndrome; MGD meibomian gland dysfunction; ATs artificial tears; PFAT's preservative free artificial tears; [place] nuclear sclerotic cataract; PSC posterior subcapsular cataract; ERM epi-retinal membrane; PVD posterior vitreous detachment; RD retinal detachment; DM diabetes mellitus; DR diabetic retinopathy; NPDR non-proliferative diabetic retinopathy; PDR proliferative diabetic retinopathy; CSME clinically significant macular edema; DME diabetic macular edema; dbh dot blot hemorrhages; CWS cotton wool spot; POAG primary open angle glaucoma; C/D cup-to-disc ratio; HVF humphrey visual field; GVF goldmann visual field; OCT optical coherence  tomography; IOP intraocular pressure; BRVO Branch retinal vein occlusion; CRVO central retinal vein occlusion; CRAO central retinal artery occlusion; BRAO branch retinal artery occlusion; RT retinal tear; SB scleral buckle; PPV pars plana vitrectomy; VH Vitreous hemorrhage; PRP panretinal laser photocoagulation; IVK intravitreal kenalog; VMT vitreomacular traction; MH Macular hole;  NVD neovascularization of the disc; NVE neovascularization elsewhere; AREDS age related eye disease study; ARMD age related macular degeneration; POAG primary open angle glaucoma; EBMD epithelial/anterior basement membrane dystrophy; ACIOL anterior chamber intraocular lens; IOL intraocular lens; PCIOL posterior chamber intraocular lens; Phaco/IOL phacoemulsification with intraocular lens placement; [place] photorefractive keratectomy; LASIK laser assisted in situ keratomileusis; HTN hypertension; DM diabetes mellitus; COPD chronic obstructive pulmonary disease

## 2018-10-19 ENCOUNTER — Ambulatory Visit (INDEPENDENT_AMBULATORY_CARE_PROVIDER_SITE_OTHER): Payer: Medicare (Managed Care) | Admitting: Ophthalmology

## 2018-10-19 ENCOUNTER — Encounter (INDEPENDENT_AMBULATORY_CARE_PROVIDER_SITE_OTHER): Payer: Self-pay | Admitting: Ophthalmology

## 2018-10-19 ENCOUNTER — Other Ambulatory Visit: Payer: Self-pay

## 2018-10-19 DIAGNOSIS — E113593 Type 2 diabetes mellitus with proliferative diabetic retinopathy without macular edema, bilateral: Secondary | ICD-10-CM | POA: Diagnosis not present

## 2018-10-19 DIAGNOSIS — H4311 Vitreous hemorrhage, right eye: Secondary | ICD-10-CM | POA: Diagnosis not present

## 2018-10-19 DIAGNOSIS — H25813 Combined forms of age-related cataract, bilateral: Secondary | ICD-10-CM

## 2018-10-19 DIAGNOSIS — I10 Essential (primary) hypertension: Secondary | ICD-10-CM | POA: Diagnosis not present

## 2018-10-19 DIAGNOSIS — H3581 Retinal edema: Secondary | ICD-10-CM | POA: Diagnosis not present

## 2018-10-19 DIAGNOSIS — H35033 Hypertensive retinopathy, bilateral: Secondary | ICD-10-CM

## 2018-10-19 MED ORDER — PREDNISOLONE ACETATE 1 % OP SUSP: 1.0000 [drp] | Freq: Four times a day (QID) | OPHTHALMIC | 0 refills | Status: DC

## 2018-11-06 NOTE — Progress Notes (Signed)
[facility] Note  11/07/2018     CHIEF COMPLAINT Patient presents for Retina Follow Up   HISTORY OF PRESENT ILLNESS: Anna Wagner is a 58 y.o. female who presents to the clinic today for:   HPI    Retina Follow Up    Patient presents with  Diabetic Retinopathy.  In both eyes.  This started 2.5 weeks ago.  Severity is moderate.  I, the attending physician,  performed the HPI with the patient and updated documentation appropriately.          Comments    Patient here for 2 1/2 weeks retina follow up for PDR OU. Patient states vision is getting better. When it rained the last time both eye felt gummed up. No eye pain.       Last edited by [name], MD on 11/07/2018  8:56 AM. (History)    Patient states she feels like the blood is clearing, she states it's been a couple weeks since she noticed any floaters, she states her A1c was checked a couple weeks ago and is down to 7 from 10   Referring physician: [name], MD [place],  [place] [id number]  HISTORICAL INFORMATION:   Selected notes from the [facility] Referred by [facility] Former pt of Dr. Satira Mccallum LEE: 11.15.19 (N. Marchase) [BCVA: OD: 20/60 OS: 20/30] Ocular Hx-NPDR OS, PDR OD, vitreous heme, cataracts OU (pt received IVL OU and PRP OU on 11.15.19)  PMH-DM (A1c: 10.1 05.01.19, takes Humulin and metformin), arthritis, depression, HTN, HLD, neuropathy   CURRENT MEDICATIONS: Current Outpatient Medications (Ophthalmic Drugs)  Medication Sig  . LOTEMAX 0.5 % GEL APPLY 1 DROP TO AFFECTED EYE 4 TIMES A DAY FOR 7 DAYS  . prednisoLONE acetate (PRED FORTE) 1 % ophthalmic suspension Place 1 drop into the right eye 4 (four) times daily.   No current facility-administered medications for this visit.  (Ophthalmic Drugs)   Current Outpatient Medications (Other)  Medication Sig  . acetaminophen (TYLENOL) 500 MG tablet Take 1,000 mg by mouth every 6  (six) hours as needed for fever.  Marland Kitchen amitriptyline (ELAVIL) 100 MG tablet Take 1 tablet (100 mg total) by mouth at bedtime.  Marland Kitchen aspirin EC 81 MG tablet Take 81 mg by mouth at bedtime.   Marland Kitchen atorvastatin (LIPITOR) 40 MG tablet Take 1 tablet (40 mg total) by mouth daily.  . cephALEXin (KEFLEX) 500 MG capsule Take 1 capsule (500 mg total) by mouth 2 (two) times daily.  . cetirizine (ZYRTEC) 10 MG tablet Take 1 tablet (10 mg total) by mouth daily.  . clotrimazole (LOTRIMIN) 1 % cream Apply 1 application topically 2 (two) times daily.  . Cyanocobalamin (VITAMIN B-12 PO) Take 1 tablet by mouth 2 (two) times daily.  . DULoxetine (CYMBALTA) 60 MG capsule Take 1 capsule (60 mg total) by mouth daily.  . furosemide (LASIX) 20 MG tablet TAKE 1 TABLET (20 MG TOTAL) BY MOUTH DAILY.  Marland Kitchen guaiFENesin (MUCINEX) 600 MG 12 hr tablet Take 1 tablet (600 mg total) by mouth 2 (two) times daily.  . insulin NPH-regular Human (HUMULIN 70/30) (70-30) 100 UNIT/ML injection Inject subcutaneously twice daily 55 units before breakfast and 60 units dinner  . Insulin Syringe-Needle U-100 (TRUEPLUS INSULIN SYRINGE) 30G X 5/16" 1 ML MISC Use as directed  . lisinopril (PRINIVIL,ZESTRIL) 20 MG tablet Take 1 tablet (20 mg total) by mouth daily.  . metFORMIN (GLUCOPHAGE) 1000 MG tablet TAKE  1 TABLET BY MOUTH TWICE DAILY WITH A MEAL  . metoprolol tartrate (LOPRESSOR) 50 MG tablet Take 1 tablet (50 mg total) by mouth 2 (two) times daily.  Marland Kitchen oxybutynin (DITROPAN XL) 10 MG 24 hr tablet Take 1 tablet (10 mg total) by mouth at bedtime.  . pregabalin (LYRICA) 100 MG capsule Take 1 capsule (100 mg total) by mouth 3 (three) times daily.  . silver sulfADIAZINE (SILVADENE) 1 % cream Apply 1 application topically 2 (two) times daily.  . traMADol (ULTRAM) 50 MG tablet Take 1 tablet (50 mg total) by mouth at bedtime.   No current facility-administered medications for this visit.  (Other)      REVIEW OF SYSTEMS: ROS    Positive for: Genitourinary,  Endocrine, Eyes   Negative for: Constitutional, Gastrointestinal, Neurological, Skin, Musculoskeletal, HENT, Cardiovascular, Respiratory, Psychiatric, Allergic/Imm, Heme/Lymph   Last edited by Theodore Demark, COA on 11/07/2018  8:39 AM. (History)       ALLERGIES No Known Allergies  PAST MEDICAL HISTORY Past Medical History:  Diagnosis Date  . Bell's palsy 2005  . Cataract   . Diabetes mellitus without complication ([place])   . Diabetic retinopathy ([place])    PDR OU  . Hypertension   . Hypertensive retinopathy    OU  . Neuromuscular disorder ([place])    NEUROPATHY  . Neuropathy   . Sepsis ([place]) 02/11/2016   Past Surgical History:  Procedure Laterality Date  . AMPUTATION Left 03/15/2014   Procedure: AMPUTATION TRANSMETATARSAL;  Surgeon: Newt Minion, MD;  Location: [facility];  Service: Orthopedics;  Laterality: Left;  . CESAREAN SECTION    . CHOLECYSTECTOMY      FAMILY HISTORY Family History  Problem Relation Age of Onset  . Heart attack Mother   . Diabetes Father   . Diabetes Sister     SOCIAL HISTORY Social History   Tobacco Use  . Smoking status: Current Every Day Smoker    Packs/day: 1.00    Years: 38.00    Pack years: 38.00    Types: Cigarettes    Last attempt to quit: 09/19/2013    Years since quitting: 5.1  . Smokeless tobacco: Never Used  Substance Use Topics  . Alcohol use: No  . Drug use: Yes    Types: Marijuana         OPHTHALMIC EXAM:  Base Eye Exam    Visual Acuity (Snellen - Linear)      Right Left   Dist [place] 20/70 -2 20/30 -1   Dist ph [place] 20/30 -1 NI       Tonometry (Tonopen, 8:36 AM)      Right Left   Pressure 16 16       Pupils      Dark Light Shape React APD   Right 4 3 Round Brisk None   Left 4 3 Round Slow None       Visual Fields (Counting fingers)      Left Right    Full Full       Extraocular Movement      Right Left    Full, Ortho Full, Ortho       Neuro/Psych    Oriented x3: Yes   Mood/Affect: Normal        Dilation    Both eyes: 1.0% Mydriacyl, 2.5% Phenylephrine @ 8:36 AM        Slit Lamp and Fundus Exam    Slit Lamp Exam      Right Left  Lids/Lashes Dermatochalasis - upper lid Dermatochalasis - upper lid   Conjunctiva/Sclera nasal and temporal Pinguecula nasal and temporal Pinguecula   Cornea 2-3+ Punctate epithelial erosions, irregular tear film 3+ Punctate epithelial erosions, irregular epi   Anterior Chamber Deep and quiet Deep and quiet   Iris Round and moderately dilated, No NVI Round and dilated, No NVI   Lens 2+ Nuclear sclerosis, 2-3+ Cortical cataract 1-2+ Nuclear sclerosis, 2+ Cortical cataract   Vitreous Vitreous syneresis, RBCs, diffuse VH clearing centrally and settling inferiorly, blood clot inferiorly  Vitreous syneresis       Fundus Exam      Right Left   Disc Pink and Sharp Pink and Sharp   C/D Ratio 0.2 0.3   Macula Flat, Blunted foveal reflex, RPE mottling and clumping Flat, blunted foveal relfex, scattered MA, Retinal pigment epithelial mottling, cluster of IRH and NVE sup mac, no edema   Vessels Attenuated, tortuous Vascular attenuation, Tortuous, AV crossing changes   Periphery Attached 360, hazy view w/ inferior quadrant obscured by VH, scattered PRP, scattered IRH, focal area of fibrosis inferior to disc with blood clot Attached, scattered IRH, good 360 PRP          IMAGING AND PROCEDURES  Imaging and Procedures for _0 @  OCT, Retina - OU - Both Eyes       Right Eye Quality was borderline. Central Foveal Thickness: 256. Progression has been stable. Findings include normal foveal contour, no IRF, no SRF, epiretinal membrane, vitreous traction (Mild Interval improvement in vitreous opacities, tractional edema inferiorly caught on widefield scan, patchy ORA).   Left Eye Quality was good. Central Foveal Thickness: 275. Progression has been stable. Findings include normal foveal contour, no IRF, no SRF, outer retinal atrophy, vitreomacular adhesion ,  retinal drusen  (Patchy ORA).   Notes *Images captured and stored on drive  Diagnosis / Impression:  OD: NFP; no IRF/SRF; +ERM; tractional edema inferior periphery; mild interval improvement in vitreous opacities OS: NFP, no IRF/SRF; patchy ORA  Clinical management:  See below  Abbreviations: NFP - Normal foveal profile. CME - cystoid macular edema. PED - pigment epithelial detachment. IRF - intraretinal fluid. SRF - subretinal fluid. EZ - ellipsoid zone. ERM - epiretinal membrane. ORA - outer retinal atrophy. ORT - outer retinal tubulation. SRHM - subretinal hyper-reflective material        Intravitreal Injection, Pharmacologic Agent - OD - Right Eye       Time Out 11/07/2018. 8:31 AM. Confirmed correct patient, procedure, site, and patient consented.   Anesthesia Topical anesthesia was used. Anesthetic medications included Lidocaine 2%, Proparacaine 0.5%.   Procedure Preparation included 5% betadine to ocular surface, eyelid speculum. A supplied needle was used.   Injection:  1.25 mg Bevacizumab (AVASTIN) SOLN   NDC: 82993-716-96, Lot: [id number] , Expiration date: 12/27/2018   Route: Intravitreal, Site: Right Eye, Waste: 0 mL  Post-op Post injection exam found visual acuity of at least counting fingers. The patient tolerated the procedure well. There were no complications. The patient received written and verbal post procedure care education.                 ASSESSMENT/PLAN:    ICD-10-CM   1. Proliferative diabetic retinopathy of both eyes without macular edema associated with type 2 diabetes mellitus (HCC)  V89.3810 Intravitreal Injection, Pharmacologic Agent - OD - Right Eye    Bevacizumab (AVASTIN) SOLN 1.25 mg  2. Retinal edema  H35.81 OCT, Retina - OU - Both Eyes  3. Vitreous hemorrhage of  right eye ([place])  H43.11   4. Essential hypertension  I10   5. Hypertensive retinopathy of both eyes  H35.033   6. Combined forms of age-related cataract of both eyes   H25.813     1,2. Proliferative diabetic retinopathy w/o DME, OU  - formerly pt of Dr. Noel [facility] at [facility]  - s/p [place] 11.15.19 -- has not been seen since then  - pt presented 6.24.20 with progressive vision loss OD since last visit with Dr. Noel Journey in 2019  - OD: diffuse VH  - OS: scattered flat NV  - OCT without diabetic macular edema, both eyes   - B-scan u/s 6.24.20 shows diffuse VH OD; no RT/RD or mass  - s/p PRP OS (06.24.20), fill in (07.07.20)  - s/p PRP OD fill in (08.06.20)  - s/p IVA OD #1 (06.24.20), #2 (07.22.20)  - FA 7.7.20 shows blockage OD from VH, OS with leaking NV  - today, BCVA OD 20/30 from 20/50; OS 20/30 from 20/40  - VH clearing OD, but significant heme persists  - recommend IVA OD #3 today, 08.25.20  - pt wishes to proceed  - RBA of procedure discussed, questions answered  - informed consent obtained and signed  - see procedure note  - completed PF QID OD x7 days  - f/u 4 weeks, DFE, OCT, possible injection  3. Vitreous Hemorrhage OD  - secondary to PDR as above  - s/p IVA OD #1 06.24.20, #2 (07.22.20)  - s/p PRP OD fill in (08.06.20)  - VH clearing and responding well to IVA  - VH precautions reviewed -- minimize activities, keep head elevated, avoid ASA/NSAIDs/blood thinners as able  - completed PF qid OD x7 days  - f/u 2-3 weeks  4,5. Hypertensive retinopathy OU  - discussed importance of tight BP control  - monitor  6. Mixed form age related cataract OU  - The symptoms of cataract, surgical options, and treatments and risks were discussed with patient.  - discussed diagnosis and progression  - not yet visually significant  - monitor for now   Ophthalmic Meds Ordered this visit:  Meds ordered this encounter  Medications  . Bevacizumab (AVASTIN) SOLN 1.25 mg       Return in about 4 weeks (around 12/05/2018) for f/u PDR OU, DFE, OCT, FA (optos, transit OD).  There are no Patient Instructions on file for this  visit.   Explained the diagnoses, plan, and follow up with the patient and they expressed understanding.  Patient expressed understanding of the importance of proper follow up care.   This document serves as a record of services personally performed by Gardiner Sleeper, MD, PhD. It was created on their behalf by Ernest Mallick, OA, an ophthalmic assistant. The creation of this record is the provider's dictation and/or activities during the visit.    Electronically signed by: Ernest Mallick, OA  08.24.2020 9:18 AM    Gardiner Sleeper, M.D., Ph.D. Diseases & Surgery of the Retina and Vitreous [facility]  I have reviewed the above documentation for accuracy and completeness, and I agree with the above. Gardiner Sleeper, M.D., Ph.D. 11/07/18 9:18 AM    Abbreviations: M myopia (nearsighted); A astigmatism; H hyperopia (farsighted); P presbyopia; Mrx spectacle prescription;  CTL contact lenses; OD right eye; OS left eye; OU both eyes  XT exotropia; ET esotropia; PEK punctate epithelial keratitis; PEE punctate epithelial erosions; DES dry eye syndrome; MGD meibomian gland dysfunction; ATs artificial  tears; PFAT's preservative free artificial tears; [place] nuclear sclerotic cataract; PSC posterior subcapsular cataract; ERM epi-retinal membrane; PVD posterior vitreous detachment; RD retinal detachment; DM diabetes mellitus; DR diabetic retinopathy; NPDR non-proliferative diabetic retinopathy; PDR proliferative diabetic retinopathy; CSME clinically significant macular edema; DME diabetic macular edema; dbh dot blot hemorrhages; CWS cotton wool spot; POAG primary open angle glaucoma; C/D cup-to-disc ratio; HVF humphrey visual field; GVF goldmann visual field; OCT optical coherence tomography; IOP intraocular pressure; BRVO Branch retinal vein occlusion; CRVO central retinal vein occlusion; CRAO central retinal artery occlusion; BRAO branch retinal artery occlusion; RT retinal tear; SB scleral  buckle; PPV pars plana vitrectomy; VH Vitreous hemorrhage; PRP panretinal laser photocoagulation; IVK intravitreal kenalog; VMT vitreomacular traction; MH Macular hole;  NVD neovascularization of the disc; NVE neovascularization elsewhere; AREDS age related eye disease study; ARMD age related macular degeneration; POAG primary open angle glaucoma; EBMD epithelial/anterior basement membrane dystrophy; ACIOL anterior chamber intraocular lens; IOL intraocular lens; PCIOL posterior chamber intraocular lens; Phaco/IOL phacoemulsification with intraocular lens placement; [place] photorefractive keratectomy; LASIK laser assisted in situ keratomileusis; HTN hypertension; DM diabetes mellitus; COPD chronic obstructive pulmonary disease

## 2018-11-07 ENCOUNTER — Other Ambulatory Visit: Payer: Self-pay

## 2018-11-07 ENCOUNTER — Encounter (INDEPENDENT_AMBULATORY_CARE_PROVIDER_SITE_OTHER): Payer: Self-pay | Admitting: Ophthalmology

## 2018-11-07 ENCOUNTER — Ambulatory Visit (INDEPENDENT_AMBULATORY_CARE_PROVIDER_SITE_OTHER): Payer: Medicare (Managed Care) | Admitting: Ophthalmology

## 2018-11-07 DIAGNOSIS — E113593 Type 2 diabetes mellitus with proliferative diabetic retinopathy without macular edema, bilateral: Secondary | ICD-10-CM | POA: Diagnosis not present

## 2018-11-07 DIAGNOSIS — H3581 Retinal edema: Secondary | ICD-10-CM | POA: Diagnosis not present

## 2018-11-07 DIAGNOSIS — H4311 Vitreous hemorrhage, right eye: Secondary | ICD-10-CM

## 2018-11-07 DIAGNOSIS — I10 Essential (primary) hypertension: Secondary | ICD-10-CM

## 2018-11-07 DIAGNOSIS — H25813 Combined forms of age-related cataract, bilateral: Secondary | ICD-10-CM

## 2018-11-07 DIAGNOSIS — H35033 Hypertensive retinopathy, bilateral: Secondary | ICD-10-CM

## 2018-11-07 MED ORDER — BEVACIZUMAB CHEMO INJECTION 1.25MG/0.05ML SYRINGE FOR KALEIDOSCOPE
1.2500 mg | INTRAVITREAL | Status: AC | PRN
Start: 2018-11-07 — End: 2018-11-07
  Administered 2018-11-07: 1.25 mg via INTRAVITREAL

## 2018-12-05 ENCOUNTER — Encounter (INDEPENDENT_AMBULATORY_CARE_PROVIDER_SITE_OTHER): Payer: Medicare (Managed Care) | Admitting: Ophthalmology

## 2018-12-10 NOTE — Progress Notes (Signed)
[facility] Note  12/11/2018     CHIEF COMPLAINT Patient presents for Retina Follow Up   HISTORY OF PRESENT ILLNESS: Anna Wagner is a 58 y.o. female who presents to the clinic today for:   HPI    Retina Follow Up    Patient presents with  Diabetic Retinopathy.  In both eyes.  This started 3 months ago.  Since onset it is stable.  I, the attending physician,  performed the HPI with the patient and updated documentation appropriately.          Comments    F/U PDR OU. Patient states her vision is better,blurrines has decreased OU. BS 159 this am, BS WINL per patient. Patient ready for tx today if indicted.       Last edited by [name], MD on 12/11/2018  1:29 PM. (History)    Patient states she is doing well   Referring physician: [name], MD [place],  [place] [phone]  HISTORICAL INFORMATION:   Selected notes from the MEDICAL RECORD NUMBER Referred by [facility] Former pt of Dr. Satira Mccallum LEE: 11.15.19 (N. Marchase) [BCVA: OD: 20/60 OS: 20/30] Ocular Hx-NPDR OS, PDR OD, vitreous heme, cataracts OU (pt received IVL OU and PRP OU on 11.15.19)  PMH-DM (A1c: 10.1 05.01.19, takes Humulin and metformin), arthritis, depression, HTN, HLD, neuropathy   CURRENT MEDICATIONS: Current Outpatient Medications (Ophthalmic Drugs)  Medication Sig  . LOTEMAX 0.5 % GEL APPLY 1 DROP TO AFFECTED EYE 4 TIMES A DAY FOR 7 DAYS  . prednisoLONE acetate (PRED FORTE) 1 % ophthalmic suspension Place 1 drop into the right eye 4 (four) times daily.   No current facility-administered medications for this visit.  (Ophthalmic Drugs)   Current Outpatient Medications (Other)  Medication Sig  . acetaminophen (TYLENOL) 500 MG tablet Take 1,000 mg by mouth every 6 (six) hours as needed for fever.  Marland Kitchen amitriptyline (ELAVIL) 100 MG tablet Take 1 tablet (100 mg total) by mouth at bedtime.  Marland Kitchen aspirin EC 81 MG tablet Take 81 mg by mouth  at bedtime.   Marland Kitchen atorvastatin (LIPITOR) 40 MG tablet Take 1 tablet (40 mg total) by mouth daily.  . cephALEXin (KEFLEX) 500 MG capsule Take 1 capsule (500 mg total) by mouth 2 (two) times daily.  . cetirizine (ZYRTEC) 10 MG tablet Take 1 tablet (10 mg total) by mouth daily.  . clotrimazole (LOTRIMIN) 1 % cream Apply 1 application topically 2 (two) times daily.  . Cyanocobalamin (VITAMIN B-12 PO) Take 1 tablet by mouth 2 (two) times daily.  . DULoxetine (CYMBALTA) 60 MG capsule Take 1 capsule (60 mg total) by mouth daily.  . furosemide (LASIX) 20 MG tablet TAKE 1 TABLET (20 MG TOTAL) BY MOUTH DAILY.  Marland Kitchen guaiFENesin (MUCINEX) 600 MG 12 hr tablet Take 1 tablet (600 mg total) by mouth 2 (two) times daily.  . insulin NPH-regular Human (HUMULIN 70/30) (70-30) 100 UNIT/ML injection Inject subcutaneously twice daily 55 units before breakfast and 60 units dinner  . Insulin Syringe-Needle U-100 (TRUEPLUS INSULIN SYRINGE) 30G X 5/16" 1 ML MISC Use as directed  . lisinopril (PRINIVIL,ZESTRIL) 20 MG tablet Take 1 tablet (20 mg total) by mouth daily.  . metFORMIN (GLUCOPHAGE) 1000 MG tablet TAKE 1 TABLET BY MOUTH TWICE DAILY WITH A MEAL  . metoprolol tartrate (LOPRESSOR) 50 MG tablet Take 1 tablet (50 mg total) by mouth 2 (two) times daily.  Marland Kitchen oxybutynin (DITROPAN XL) 10  MG 24 hr tablet Take 1 tablet (10 mg total) by mouth at bedtime.  . pregabalin (LYRICA) 100 MG capsule Take 1 capsule (100 mg total) by mouth 3 (three) times daily.  . silver sulfADIAZINE (SILVADENE) 1 % cream Apply 1 application topically 2 (two) times daily.  . traMADol (ULTRAM) 50 MG tablet Take 1 tablet (50 mg total) by mouth at bedtime.   No current facility-administered medications for this visit.  (Other)      REVIEW OF SYSTEMS: ROS    Positive for: Endocrine, Eyes   Negative for: Constitutional, Gastrointestinal, Neurological, Skin, Genitourinary, Musculoskeletal, HENT, Cardiovascular, Respiratory, Psychiatric, Allergic/Imm,  Heme/Lymph   Last edited by Zenovia Jordan, LPN on 03/17/1115  3:56 PM. (History)       ALLERGIES No Known Allergies  PAST MEDICAL HISTORY Past Medical History:  Diagnosis Date  . Bell's palsy 2005  . Cataract   . Diabetes mellitus without complication ([place])   . Diabetic retinopathy ([place])    PDR OU  . Hypertension   . Hypertensive retinopathy    OU  . Neuromuscular disorder ([place])    NEUROPATHY  . Neuropathy   . Sepsis ([place]) 02/11/2016   Past Surgical History:  Procedure Laterality Date  . AMPUTATION Left 03/15/2014   Procedure: AMPUTATION TRANSMETATARSAL;  Surgeon: Newt Minion, MD;  Location: [facility];  Service: Orthopedics;  Laterality: Left;  . CESAREAN SECTION    . CHOLECYSTECTOMY      FAMILY HISTORY Family History  Problem Relation Age of Onset  . Heart attack Mother   . Diabetes Father   . Diabetes Sister     SOCIAL HISTORY Social History   Tobacco Use  . Smoking status: Current Every Day Smoker    Packs/day: 1.00    Years: 38.00    Pack years: 38.00    Types: Cigarettes    Last attempt to quit: 09/19/2013    Years since quitting: 5.2  . Smokeless tobacco: Never Used  Substance Use Topics  . Alcohol use: No  . Drug use: Yes    Types: Marijuana         OPHTHALMIC EXAM:  Base Eye Exam    Visual Acuity (Snellen - Linear)      Right Left   Dist [place] 20/50 -1 20/30 +2   Dist ph [place] 20/30 -2 20/25       Tonometry (Tonopen, 1:19 PM)      Right Left   Pressure 12 12       Pupils      Dark Light Shape React APD   Right 3 2 Round Brisk None   Left 3 2 Round Brisk None       Visual Fields (Counting fingers)      Left Right    Full Full       Extraocular Movement      Right Left    Full, Ortho Full, Ortho       Neuro/Psych    Oriented x3: Yes   Mood/Affect: Normal       Dilation    Both eyes: 1.0% Mydriacyl, 2.5% Phenylephrine @ 1:15 PM        Slit Lamp and Fundus Exam    Slit Lamp Exam      Right Left   Lids/Lashes  Dermatochalasis - upper lid Dermatochalasis - upper lid   Conjunctiva/Sclera nasal and temporal Pinguecula nasal and temporal Pinguecula   Cornea 2-3+ Punctate epithelial erosions, irregular tear film 3+ Punctate epithelial erosions, irregular epi  Anterior Chamber Deep and quiet Deep and quiet   Iris Round and moderately dilated, No NVI Round and dilated, No NVI   Lens 2+ Nuclear sclerosis, 2-3+ Cortical cataract 1-2+ Nuclear sclerosis, 2+ Cortical cataract   Vitreous Vitreous syneresis, RBCs, diffuse VH clearing centrally and settling inferiorly, blood clots inferiorly , Posterior vitreous detachment Vitreous syneresis       Fundus Exam      Right Left   Disc Pink and Sharp Pink and Sharp   C/D Ratio 0.2 0.3   Macula Flat, Blunted foveal reflex, RPE mottling and clumping Flat, good foveal relfex, mild scattered MA, Retinal pigment epithelial mottling, cluster of IRH and NVE sup mac, no edema   Vessels Attenuated, tortuous Vascular attenuation, Tortuous, AV crossing changes   Periphery Attached 360, hazy view w/ inferior quadrant obscured by VH, scattered PRP, scattered IRH, focal area of fibrosis inferior to disc with blood clot Attached, scattered IRH, good 360 PRP          IMAGING AND PROCEDURES  Imaging and Procedures for _0 @  OCT, Retina - OU - Both Eyes       Right Eye Quality was good. Central Foveal Thickness: 244. Progression has been stable. Findings include normal foveal contour, no IRF, no SRF, epiretinal membrane, vitreous traction (Mild Interval improvement in vitreous opacities, tractional edema inferiorly caught on widefield scan, patchy ORA).   Left Eye Quality was good. Central Foveal Thickness: 268. Progression has been stable. Findings include normal foveal contour, no IRF, no SRF, outer retinal atrophy, vitreomacular adhesion , retinal drusen  (Patchy ORA).   Notes *Images captured and stored on drive  Diagnosis / Impression:  OD: NFP; no IRF/SRF;  +ERM; tractional edema inferior periphery; mild interval improvement in vitreous opacities OS: NFP, no IRF/SRF; patchy ORA  Clinical management:  See below  Abbreviations: NFP - Normal foveal profile. CME - cystoid macular edema. PED - pigment epithelial detachment. IRF - intraretinal fluid. SRF - subretinal fluid. EZ - ellipsoid zone. ERM - epiretinal membrane. ORA - outer retinal atrophy. ORT - outer retinal tubulation. SRHM - subretinal hyper-reflective material        Intravitreal Injection, Pharmacologic Agent - OS - Left Eye       Time Out 12/11/2018. 2:16 PM. Confirmed correct patient, procedure, site, and patient consented.   Anesthesia Topical anesthesia was used. Anesthetic medications included Lidocaine 2%, Proparacaine 0.5%.   Procedure Preparation included 5% betadine to ocular surface, eyelid speculum. A 30 gauge needle was used.   Injection:  1.25 mg Bevacizumab (AVASTIN) SOLN   NDC: 54008-676-19, Lot: [id number] , Expiration date: 01/31/2019   Route: Intravitreal, Site: Left Eye, Waste: 0 mL  Post-op Post injection exam found visual acuity of at least counting fingers. The patient tolerated the procedure well. There were no complications. The patient received written and verbal post procedure care education.        Fluorescein Angiography Optos (Transit OD)       Right Eye   Progression has no prior data. Early phase findings include delayed filling, leakage, staining, blockage. Mid/Late phase findings include blockage, staining, leakage (No retinal NV).   Left Eye   Progression has no prior data. Early phase findings include microaneurysm, leakage, staining, retinal neovascularization. Mid/Late phase findings include microaneurysm, retinal neovascularization, leakage, staining.   Notes Images stored on drive;   Impression: PDR OU OD: no active NV OS: +active NV         Intravitreal Injection, Pharmacologic Agent - OD - Right  Eye        Time Out 12/11/2018. 2:14 PM. Confirmed correct patient, procedure, site, and patient consented.   Anesthesia Topical anesthesia was used. Anesthetic medications included Lidocaine 2%, Proparacaine 0.5%.   Procedure Preparation included 5% betadine to ocular surface, eyelid speculum. A 30 gauge needle was used.   Injection:  1.25 mg Bevacizumab (AVASTIN) SOLN   NDC: 62836-629-47, Lot: [id number] , Expiration date: 03/01/2019   Route: Intravitreal, Site: Right Eye, Waste: 0 mL  Post-op Post injection exam found visual acuity of at least counting fingers. The patient tolerated the procedure well. There were no complications. The patient received written and verbal post procedure care education.                 ASSESSMENT/PLAN:    ICD-10-CM   1. 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 Retinal edema  H35.81 OCT, Retina - OU - Both Eyes  3. Vitreous hemorrhage of right eye (HCC)  H43.11 Intravitreal Injection, Pharmacologic Agent - OD - Right Eye    Bevacizumab (AVASTIN) SOLN 1.25 mg  4. Essential hypertension  I10   5. Hypertensive retinopathy of both eyes  H35.033 Fluorescein Angiography Optos (Transit OD)  6. Combined forms of age-related cataract of both eyes  H25.813     1,2. Proliferative diabetic retinopathy w/o DME, OU  - formerly [facility] of [facility]el [facility]  - s/p [place] 11.15.19 -- has not been seen since then  - pt presented 6.24.20 with progressive vision loss OD since last visit with Dr. Noel Journey in 2019  - OD: diffuse VH  - OS: scattered flat NV  - OCT without diabetic macular edema, both eyes   - B-scan u/s 6.24.20 shows diffuse VH OD; no RT/RD or  mass  - s/p PRP OS (06.24.20), fill in (07.07.20)  - s/p PRP OD fill in (08.06.20)  - s/p IVA OD #1 (06.24.20), #2 (07.22.20), #3 (08.25.20)  - FA 9.28.20 shows focal blockage OD from residual VH, no NV; OS with persistent leaking NV  - today, BCVA OD 20/30 from 20/50; OS 20/30 from 20/40  - VH clearing OD  - recommend IVA OU (OD #3; OS #1) today, 08.25.20 -- OD for VH, OS for active NVE  - pt wishes to proceed  - RBA of procedure discussed, questions answered  - informed consent obtained and signed  - see procedure note  - f/u 2 weeks, DFE, OCT, PRP fill in   3. Vitreous Hemorrhage OD  - secondary to PDR as above  - s/p IVA OD #1 06.24.20, #2 (07.22.20)  - s/p PRP OD fill in (08.06.20)  - VH clearing and responding well to IVA  - VH precautions reviewed -- minimize activities, keep head elevated, avoid ASA/NSAIDs/blood thinners as able  4,5. Hypertensive retinopathy OU  - discussed importance of tight BP control  - monitor  6. Mixed form age related cataract OU  - The symptoms of cataract, surgical options, and treatments and risks were discussed with patient.  - discussed diagnosis and progression  - not yet visually significant  - monitor for now   Ophthalmic Meds Ordered this visit:  Meds ordered this encounter  Medications  .  Bevacizumab (AVASTIN) SOLN 1.25 mg  . Bevacizumab (AVASTIN) SOLN 1.25 mg       Return in about 4 weeks (around 01/08/2019) for f/u PDR OU, DFE, OCT.  There are no Patient Instructions on file for this visit.   Explained the diagnoses, plan, and follow up with the patient and they expressed understanding.  Patient expressed understanding of the importance of proper follow up care.   This document serves as a record of services personally performed by Gardiner Sleeper, MD, PhD. It was created on their behalf by Ernest Mallick, OA, an ophthalmic assistant. The creation of this record is the provider's dictation and/or activities during the visit.     Electronically signed by: Ernest Mallick, OA  09.27.2020 2:37 PM    Gardiner Sleeper, M.D., Ph.D. Diseases & Surgery of the Retina and Vitreous [facility]  I have reviewed the above documentation for accuracy and completeness, and I agree with the above. Gardiner Sleeper, M.D., Ph.D. 12/11/18 2:37 PM    Abbreviations: M myopia (nearsighted); A astigmatism; H hyperopia (farsighted); P presbyopia; Mrx spectacle prescription;  CTL contact lenses; OD right eye; OS left eye; OU both eyes  XT exotropia; ET esotropia; PEK punctate epithelial keratitis; PEE punctate epithelial erosions; DES dry eye syndrome; MGD meibomian gland dysfunction; ATs artificial tears; PFAT's preservative free artificial tears; [place] nuclear sclerotic cataract; PSC posterior subcapsular cataract; ERM epi-retinal membrane; PVD posterior vitreous detachment; RD retinal detachment; DM diabetes mellitus; DR diabetic retinopathy; NPDR non-proliferative diabetic retinopathy; PDR proliferative diabetic retinopathy; CSME clinically significant macular edema; DME diabetic macular edema; dbh dot blot hemorrhages; CWS cotton wool spot; POAG primary open angle glaucoma; C/D cup-to-disc ratio; HVF humphrey visual field; GVF goldmann visual field; OCT optical coherence tomography; IOP intraocular pressure; BRVO Branch retinal vein occlusion; CRVO central retinal vein occlusion; CRAO central retinal artery occlusion; BRAO branch retinal artery occlusion; RT retinal tear; SB scleral buckle; PPV pars plana vitrectomy; VH Vitreous hemorrhage; PRP panretinal laser photocoagulation; IVK intravitreal kenalog; VMT vitreomacular traction; MH Macular hole;  NVD neovascularization of the disc; NVE neovascularization elsewhere; AREDS age related eye disease study; ARMD age related macular degeneration; POAG primary open angle glaucoma; EBMD epithelial/anterior basement membrane dystrophy; ACIOL anterior chamber intraocular lens; IOL  intraocular lens; PCIOL posterior chamber intraocular lens; Phaco/IOL phacoemulsification with intraocular lens placement; [place] photorefractive keratectomy; LASIK laser assisted in situ keratomileusis; HTN hypertension; DM diabetes mellitus; COPD chronic obstructive pulmonary disease

## 2018-12-11 ENCOUNTER — Ambulatory Visit (INDEPENDENT_AMBULATORY_CARE_PROVIDER_SITE_OTHER): Payer: Medicare (Managed Care) | Admitting: Ophthalmology

## 2018-12-11 ENCOUNTER — Other Ambulatory Visit: Payer: Self-pay

## 2018-12-11 ENCOUNTER — Encounter (INDEPENDENT_AMBULATORY_CARE_PROVIDER_SITE_OTHER): Payer: Self-pay | Admitting: Ophthalmology

## 2018-12-11 DIAGNOSIS — H25813 Combined forms of age-related cataract, bilateral: Secondary | ICD-10-CM

## 2018-12-11 DIAGNOSIS — E113593 Type 2 diabetes mellitus with proliferative diabetic retinopathy without macular edema, bilateral: Secondary | ICD-10-CM | POA: Diagnosis not present

## 2018-12-11 DIAGNOSIS — H4311 Vitreous hemorrhage, right eye: Secondary | ICD-10-CM | POA: Diagnosis not present

## 2018-12-11 DIAGNOSIS — H35033 Hypertensive retinopathy, bilateral: Secondary | ICD-10-CM | POA: Diagnosis not present

## 2018-12-11 DIAGNOSIS — I10 Essential (primary) hypertension: Secondary | ICD-10-CM | POA: Diagnosis not present

## 2018-12-11 DIAGNOSIS — H3581 Retinal edema: Secondary | ICD-10-CM | POA: Diagnosis not present

## 2018-12-11 MED ORDER — BEVACIZUMAB CHEMO INJECTION 1.25MG/0.05ML SYRINGE FOR KALEIDOSCOPE
1.2500 mg | INTRAVITREAL | Status: AC | PRN
  Administered 2018-12-11: 1.25 mg via INTRAVITREAL

## 2018-12-19 NOTE — Progress Notes (Signed)
[facility] Note  12/25/2018     CHIEF COMPLAINT Patient presents for Retina Follow Up   HISTORY OF PRESENT ILLNESS: Anna Wagner is a 58 y.o. female who presents to the clinic today for:   HPI    Retina Follow Up    Patient presents with  Diabetic Retinopathy.  In both eyes.  This started weeks ago.  Severity is moderate.  Duration of weeks.  Since onset it is stable.  I, the attending physician,  performed the HPI with the patient and updated documentation appropriately.          Comments    BS: 222 yesterday Patient states her vision is stable in both eyes.  She denies eye pain or discomfort and denies any new or worsening floaters or fol OU.       Last edited by [name], MD on 12/25/2018  8:15 AM. (History)    Patient here for fill-in PRP OD, pt states her vision is improving   Referring physician: [name], MD [place],  [place] [address]  HISTORICAL INFORMATION:   Selected notes from the MEDICAL RECORD NUMBER Referred by [facility] Former pt of Dr. Satira Mccallum LEE: 11.15.19 (N. Marchase) [BCVA: OD: 20/60 OS: 20/30] Ocular Hx-NPDR OS, PDR OD, vitreous heme, cataracts OU (pt received IVL OU and PRP OU on 11.15.19)  PMH-DM (A1c: 10.1 05.01.19, takes Humulin and metformin), arthritis, depression, HTN, HLD, neuropathy   CURRENT MEDICATIONS: Current Outpatient Medications (Ophthalmic Drugs)  Medication Sig  . LOTEMAX 0.5 % GEL APPLY 1 DROP TO AFFECTED EYE 4 TIMES A DAY FOR 7 DAYS  . prednisoLONE acetate (PRED FORTE) 1 % ophthalmic suspension Place 1 drop into the right eye 4 (four) times daily.   No current facility-administered medications for this visit.  (Ophthalmic Drugs)   Current Outpatient Medications (Other)  Medication Sig  . acetaminophen (TYLENOL) 500 MG tablet Take 1,000 mg by mouth every 6 (six) hours as needed for fever.  Marland Kitchen amitriptyline (ELAVIL) 100 MG tablet Take 1 tablet (100  mg total) by mouth at bedtime.  Marland Kitchen aspirin EC 81 MG tablet Take 81 mg by mouth at bedtime.   Marland Kitchen atorvastatin (LIPITOR) 40 MG tablet Take 1 tablet (40 mg total) by mouth daily.  . cephALEXin (KEFLEX) 500 MG capsule Take 1 capsule (500 mg total) by mouth 2 (two) times daily.  . cetirizine (ZYRTEC) 10 MG tablet Take 1 tablet (10 mg total) by mouth daily.  . clotrimazole (LOTRIMIN) 1 % cream Apply 1 application topically 2 (two) times daily.  . Cyanocobalamin (VITAMIN B-12 PO) Take 1 tablet by mouth 2 (two) times daily.  . DULoxetine (CYMBALTA) 60 MG capsule Take 1 capsule (60 mg total) by mouth daily.  . furosemide (LASIX) 20 MG tablet TAKE 1 TABLET (20 MG TOTAL) BY MOUTH DAILY.  Marland Kitchen guaiFENesin (MUCINEX) 600 MG 12 hr tablet Take 1 tablet (600 mg total) by mouth 2 (two) times daily.  . insulin NPH-regular Human (HUMULIN 70/30) (70-30) 100 UNIT/ML injection Inject subcutaneously twice daily 55 units before breakfast and 60 units dinner  . Insulin Syringe-Needle U-100 (TRUEPLUS INSULIN SYRINGE) 30G X 5/16" 1 ML MISC Use as directed  . lisinopril (PRINIVIL,ZESTRIL) 20 MG tablet Take 1 tablet (20 mg total) by mouth daily.  . metFORMIN (GLUCOPHAGE) 1000 MG tablet TAKE 1 TABLET BY MOUTH TWICE DAILY WITH A MEAL  . metoprolol tartrate (LOPRESSOR) 50 MG tablet Take 1 tablet (  50 mg total) by mouth 2 (two) times daily.  Marland Kitchen oxybutynin (DITROPAN XL) 10 MG 24 hr tablet Take 1 tablet (10 mg total) by mouth at bedtime.  . pregabalin (LYRICA) 100 MG capsule Take 1 capsule (100 mg total) by mouth 3 (three) times daily.  . silver sulfADIAZINE (SILVADENE) 1 % cream Apply 1 application topically 2 (two) times daily.  . traMADol (ULTRAM) 50 MG tablet Take 1 tablet (50 mg total) by mouth at bedtime.   No current facility-administered medications for this visit.  (Other)      REVIEW OF SYSTEMS: ROS    Positive for: Endocrine, Eyes   Negative for: Constitutional, Gastrointestinal, Neurological, Skin, Genitourinary,  Musculoskeletal, HENT, Cardiovascular, Respiratory, Psychiatric, Allergic/Imm, Heme/Lymph   Last edited by Doneen Poisson on 12/25/2018  8:03 AM. (History)       ALLERGIES No Known Allergies  PAST MEDICAL HISTORY Past Medical History:  Diagnosis Date  . Bell's palsy 2005  . Cataract   . Diabetes mellitus without complication ([place])   . Diabetic retinopathy ([place])    PDR OU  . Hypertension   . Hypertensive retinopathy    OU  . Neuromuscular disorder ([place])    NEUROPATHY  . Neuropathy   . Sepsis ([place]) 02/11/2016   Past Surgical History:  Procedure Laterality Date  . AMPUTATION Left 03/15/2014   Procedure: AMPUTATION TRANSMETATARSAL;  Surgeon: Newt Minion, MD;  Location: [facility];  Service: Orthopedics;  Laterality: Left;  . CESAREAN SECTION    . CHOLECYSTECTOMY      FAMILY HISTORY Family History  Problem Relation Age of Onset  . Heart attack Mother   . Diabetes Father   . Diabetes Sister     SOCIAL HISTORY Social History   Tobacco Use  . Smoking status: Current Every Day Smoker    Packs/day: 1.00    Years: 38.00    Pack years: 38.00    Types: Cigarettes    Last attempt to quit: 09/19/2013    Years since quitting: 5.2  . Smokeless tobacco: Never Used  Substance Use Topics  . Alcohol use: No  . Drug use: Yes    Types: Marijuana         OPHTHALMIC EXAM:  Base Eye Exam    Visual Acuity (Snellen - Linear)      Right Left   Dist [place] 20/50 -2 20/30 -1   Dist ph [place] 20/30 -1 20/25 -2       Tonometry (Tonopen, 8:07 AM)      Right Left   Pressure 17 17       Pupils      Dark Light Shape React APD   Right 3 2 Round Brisk 0   Left 3 2 Round Brisk 0       Visual Fields      Left Right    Full Full       Extraocular Movement      Right Left    Full Full       Neuro/Psych    Oriented x3: Yes   Mood/Affect: Normal       Dilation    Both eyes: 1.0% Mydriacyl, 2.5% Phenylephrine @ 8:07 AM        Slit Lamp and Fundus Exam    Slit Lamp Exam       Right Left   Lids/Lashes Dermatochalasis - upper lid Dermatochalasis - upper lid   Conjunctiva/Sclera nasal and temporal Pinguecula nasal and temporal Pinguecula   Cornea 2-3+  Punctate epithelial erosions, irregular tear film 3+ Punctate epithelial erosions, irregular epi   Anterior Chamber Deep and quiet Deep and quiet   Iris Round and moderately dilated, No NVI Round and dilated, No NVI   Lens 2+ Nuclear sclerosis, 2-3+ Cortical cataract 1-2+ Nuclear sclerosis, 2+ Cortical cataract   Vitreous Vitreous syneresis, RBCs, diffuse VH clearing centrally and settling inferiorly, blood clots inferiorly , Posterior vitreous detachment Vitreous syneresis       Fundus Exam      Right Left   Disc Pink and Sharp Pink and Sharp   C/D Ratio 0.2 0.3   Macula Flat, Blunted foveal reflex, RPE mottling and clumping Flat, good foveal relfex, mild scattered MA, Retinal pigment epithelial mottling, cluster of IRH and NVE sup mac, no edema   Vessels Attenuated, tortuous Vascular attenuation, Tortuous, AV crossing changes   Periphery Attached 360, hazy view w/ inferior quadrant obscured by VH, scattered PRP, scattered IRH, focal area of fibrosis inferior to disc with blood clot Attached, scattered IRH, good 360 PRP -- room for fill in if needed          IMAGING AND PROCEDURES  Imaging and Procedures for _0 @  OCT, Retina - OU - Both Eyes       Right Eye Quality was good. Central Foveal Thickness: 240. Progression has been stable. Findings include normal foveal contour, no IRF, no SRF, epiretinal membrane, vitreous traction (Mild Interval improvement in vitreous opacities, tractional edema inferiorly caught on widefield scan -- stable, patchy ORA).   Left Eye Quality was good. Central Foveal Thickness: 261. Progression has been stable. Findings include normal foveal contour, no IRF, no SRF, outer retinal atrophy, vitreomacular adhesion , retinal drusen  (Patchy ORA).   Notes *Images captured and  stored on drive  Diagnosis / Impression:  OD: NFP; no IRF/SRF; +ERM; tractional edema inferior periphery; mild interval improvement in vitreous opacities OS: NFP, no IRF/SRF; patchy ORA  Clinical management:  See below  Abbreviations: NFP - Normal foveal profile. CME - cystoid macular edema. PED - pigment epithelial detachment. IRF - intraretinal fluid. SRF - subretinal fluid. EZ - ellipsoid zone. ERM - epiretinal membrane. ORA - outer retinal atrophy. ORT - outer retinal tubulation. SRHM - subretinal hyper-reflective material                 ASSESSMENT/PLAN:    ICD-10-CM   1. Proliferative diabetic retinopathy of both eyes without macular edema associated with type 2 diabetes mellitus (HCC)  Z61.0960 Panretinal Photocoagulation - OD - Right Eye    CANCELED: Intravitreal Injection, Pharmacologic Agent - OD - Right Eye  2. Retinal edema  H35.81 OCT, Retina - OU - Both Eyes  3. Vitreous hemorrhage of right eye ([place])  H43.11   4. Essential hypertension  I10   5. Hypertensive retinopathy of both eyes  H35.033   6. Combined forms of age-related cataract of both eyes  H25.813     1,2. Proliferative diabetic retinopathy w/o DME, OU  - formerly pt of Dr[facility] at [facility]  - s/p [place] 11.15.19 -- has not been seen since then  - pt presented 6.24.20 with progressive vision loss OD since last visit with Dr. Noel Journey in 2019  - OD: diffuse VH  - OS: scattered flat NV  - OCT without diabetic macular edema, both eyes   - B-scan u/s 6.24.20 shows diffuse VH OD; no RT/RD or mass  - s/p PRP OS (06.24.20), fill in (07.07.20)  - s/p PRP  OD fill in (08.06.20)  - s/p IVA OD #1 (06.24.20), #2 (07.22.20), #3 (08.25.20), #4 (09.28.20)  - FA 9.28.20 shows focal blockage OD from residual VH, no NV; OS with persistent leaking NV -- would likely benefit from fill in PRP OU  - today, BCVA OD stable at 20/30; OS 20/25 from 20/30  - VH clearing OD  - recommend PRP OD fill-in today,  10.12.20  - pt wishes to proceed  - RBA of procedure discussed, questions answered  - informed consent obtained and signed  - see procedure note  - f/u 2 weeks, DFE, OCT, possible injections   3. Vitreous Hemorrhage OD  - secondary to PDR as above  - s/p IVA OD #1 06.24.20, #2 (07.22.20),  #3 (08.25.20), #4 (09.28.20)  - s/p PRP OD fill in (08.06.20)  - VH clearing and responding well to IVA  - VH precautions reviewed -- minimize activities, keep head elevated, avoid ASA/NSAIDs/blood thinners as able  4,5. Hypertensive retinopathy OU  - discussed importance of tight BP control  - monitor  6. Mixed form age related cataract OU  - The symptoms of cataract, surgical options, and treatments and risks were discussed with patient.  - discussed diagnosis and progression  - not yet visually significant  - monitor for now   Ophthalmic Meds Ordered this visit:  No orders of the defined types were placed in this encounter.      Return in about 2 weeks (around 01/08/2019) for Dilated Exam, OCT, Possible Injxn.  There are no Patient Instructions on file for this visit.   Explained the diagnoses, plan, and follow up with the patient and they expressed understanding.  Patient expressed understanding of the importance of proper follow up care.    Electronically signed by: Leeann Must, Cecilia 12/19/2018 10:04  This document serves as a record of services personally performed by Gardiner Sleeper, MD, PhD. It was created on their behalf by Ernest Mallick, OA, an ophthalmic assistant. The creation of this record is the provider's dictation and/or activities during the visit.    Electronically signed by: Ernest Mallick, OA 10.12.2020 8:33 AM    Gardiner Sleeper, M.D., Ph.D. Diseases & Surgery of the Retina and Vitreous [facility]  I have reviewed the above documentation for accuracy and completeness, and I agree with the above. Gardiner Sleeper, M.D., Ph.D. 12/25/18  8:50 AM     Abbreviations: M myopia (nearsighted); A astigmatism; H hyperopia (farsighted); P presbyopia; Mrx spectacle prescription;  CTL contact lenses; OD right eye; OS left eye; OU both eyes  XT exotropia; ET esotropia; PEK punctate epithelial keratitis; PEE punctate epithelial erosions; DES dry eye syndrome; MGD meibomian gland dysfunction; ATs artificial tears; PFAT's preservative free artificial tears; [place] nuclear sclerotic cataract; PSC posterior subcapsular cataract; ERM epi-retinal membrane; PVD posterior vitreous detachment; RD retinal detachment; DM diabetes mellitus; DR diabetic retinopathy; NPDR non-proliferative diabetic retinopathy; PDR proliferative diabetic retinopathy; CSME clinically significant macular edema; DME diabetic macular edema; dbh dot blot hemorrhages; CWS cotton wool spot; POAG primary open angle glaucoma; C/D cup-to-disc ratio; HVF humphrey visual field; GVF goldmann visual field; OCT optical coherence tomography; IOP intraocular pressure; BRVO Branch retinal vein occlusion; CRVO central retinal vein occlusion; CRAO central retinal artery occlusion; BRAO branch retinal artery occlusion; RT retinal tear; SB scleral buckle; PPV pars plana vitrectomy; VH Vitreous hemorrhage; PRP panretinal laser photocoagulation; IVK intravitreal kenalog; VMT vitreomacular traction; MH Macular hole;  NVD neovascularization of the disc; NVE neovascularization elsewhere;  AREDS age related eye disease study; ARMD age related macular degeneration; POAG primary open angle glaucoma; EBMD epithelial/anterior basement membrane dystrophy; ACIOL anterior chamber intraocular lens; IOL intraocular lens; PCIOL posterior chamber intraocular lens; Phaco/IOL phacoemulsification with intraocular lens placement; [place] photorefractive keratectomy; LASIK laser assisted in situ keratomileusis; HTN hypertension; DM diabetes mellitus; COPD chronic obstructive pulmonary disease

## 2018-12-25 ENCOUNTER — Encounter (INDEPENDENT_AMBULATORY_CARE_PROVIDER_SITE_OTHER): Payer: Self-pay | Admitting: Ophthalmology

## 2018-12-25 ENCOUNTER — Ambulatory Visit (INDEPENDENT_AMBULATORY_CARE_PROVIDER_SITE_OTHER): Payer: Medicare (Managed Care) | Admitting: Ophthalmology

## 2018-12-25 DIAGNOSIS — E113593 Type 2 diabetes mellitus with proliferative diabetic retinopathy without macular edema, bilateral: Secondary | ICD-10-CM | POA: Diagnosis not present

## 2018-12-25 DIAGNOSIS — H3581 Retinal edema: Secondary | ICD-10-CM | POA: Diagnosis not present

## 2018-12-25 DIAGNOSIS — H4311 Vitreous hemorrhage, right eye: Secondary | ICD-10-CM

## 2018-12-25 DIAGNOSIS — H25813 Combined forms of age-related cataract, bilateral: Secondary | ICD-10-CM

## 2018-12-25 DIAGNOSIS — I10 Essential (primary) hypertension: Secondary | ICD-10-CM

## 2018-12-25 DIAGNOSIS — H35033 Hypertensive retinopathy, bilateral: Secondary | ICD-10-CM

## 2018-12-25 MED ORDER — PREDNISOLONE ACETATE 1 % OP SUSP: 1.0000 [drp] | Freq: Four times a day (QID) | OPHTHALMIC | 0 refills | Status: AC

## 2019-01-08 ENCOUNTER — Ambulatory Visit (INDEPENDENT_AMBULATORY_CARE_PROVIDER_SITE_OTHER): Payer: Medicare (Managed Care) | Admitting: Ophthalmology

## 2019-01-08 ENCOUNTER — Encounter (INDEPENDENT_AMBULATORY_CARE_PROVIDER_SITE_OTHER): Payer: Self-pay | Admitting: Ophthalmology

## 2019-01-08 DIAGNOSIS — H3581 Retinal edema: Secondary | ICD-10-CM | POA: Diagnosis not present

## 2019-01-08 DIAGNOSIS — H35033 Hypertensive retinopathy, bilateral: Secondary | ICD-10-CM

## 2019-01-08 DIAGNOSIS — E113593 Type 2 diabetes mellitus with proliferative diabetic retinopathy without macular edema, bilateral: Secondary | ICD-10-CM

## 2019-01-08 DIAGNOSIS — I10 Essential (primary) hypertension: Secondary | ICD-10-CM | POA: Diagnosis not present

## 2019-01-08 DIAGNOSIS — H4311 Vitreous hemorrhage, right eye: Secondary | ICD-10-CM | POA: Diagnosis not present

## 2019-01-08 DIAGNOSIS — H25813 Combined forms of age-related cataract, bilateral: Secondary | ICD-10-CM

## 2019-01-08 MED ORDER — BEVACIZUMAB CHEMO INJECTION 1.25MG/0.05ML SYRINGE FOR KALEIDOSCOPE
1.2500 mg | INTRAVITREAL | Status: AC | PRN
  Administered 2019-01-08: 21:00:00 1.25 mg via INTRAVITREAL

## 2019-01-08 NOTE — Progress Notes (Signed)
[facility] Note  01/08/2019     CHIEF COMPLAINT Patient presents for Retina Follow Up   HISTORY OF PRESENT ILLNESS: Anna Wagner is a 58 y.o. female who presents to the clinic today for:   HPI    Retina Follow Up    Patient presents with  Diabetic Retinopathy.  In both eyes.  This started months ago.  Severity is moderate.  Duration of 2 weeks.  Since onset it is gradually improving.  I, the attending physician,  performed the HPI with the patient and updated documentation appropriately.          Comments    58 y/o female pt here for 2 wk f/u for PDR OU.  S/p PRP fill-in OD 10.12.20.  Feels VA OU is gradually getting better.  Denies pain, flashes, floaters.  Systane prn OU.  BS 192 this a.m.  A1C 7.4 1 wk ago.       Last edited by [name], MD on 01/08/2019  9:06 PM. (History)    Patient    Referring physician: [facility], [address] [place],  [place] [id number]  HISTORICAL INFORMATION:   Selected notes from the MEDICAL RECORD NUMBER Referred by [facility] Former pt of Dr. Satira Mccallum LEE: 11.15.19 (N. Marchase) [BCVA: OD: 20/60 OS: 20/30] Ocular Hx-NPDR OS, PDR OD, vitreous heme, cataracts OU (pt received IVL OU and PRP OU on 11.15.19)  PMH-DM (A1c: 10.1 05.01.19, takes Humulin and metformin), arthritis, depression, HTN, HLD, neuropathy   CURRENT MEDICATIONS: Current Outpatient Medications (Ophthalmic Drugs)  Medication Sig  . LOTEMAX 0.5 % GEL APPLY 1 DROP TO AFFECTED EYE 4 TIMES A DAY FOR 7 DAYS   No current facility-administered medications for this visit.  (Ophthalmic Drugs)   Current Outpatient Medications (Other)  Medication Sig  . acetaminophen (TYLENOL) 500 MG tablet Take 1,000 mg by mouth every 6 (six) hours as needed for fever.  Marland Kitchen amitriptyline (ELAVIL) 100 MG tablet Take 1 tablet (100 mg total) by mouth at bedtime.  Marland Kitchen aspirin EC 81 MG tablet Take 81 mg by mouth at bedtime.    Marland Kitchen atorvastatin (LIPITOR) 40 MG tablet Take 1 tablet (40 mg total) by mouth daily.  . cephALEXin (KEFLEX) 500 MG capsule Take 1 capsule (500 mg total) by mouth 2 (two) times daily.  . cetirizine (ZYRTEC) 10 MG tablet Take 1 tablet (10 mg total) by mouth daily.  . clotrimazole (LOTRIMIN) 1 % cream Apply 1 application topically 2 (two) times daily.  . Cyanocobalamin (VITAMIN B-12 PO) Take 1 tablet by mouth 2 (two) times daily.  . DULoxetine (CYMBALTA) 60 MG capsule Take 1 capsule (60 mg total) by mouth daily.  . furosemide (LASIX) 20 MG tablet TAKE 1 TABLET (20 MG TOTAL) BY MOUTH DAILY.  Marland Kitchen guaiFENesin (MUCINEX) 600 MG 12 hr tablet Take 1 tablet (600 mg total) by mouth 2 (two) times daily.  . insulin NPH-regular Human (HUMULIN 70/30) (70-30) 100 UNIT/ML injection Inject subcutaneously twice daily 55 units before breakfast and 60 units dinner  . Insulin Syringe-Needle U-100 (TRUEPLUS INSULIN SYRINGE) 30G X 5/16" 1 ML MISC Use as directed  . lisinopril (PRINIVIL,ZESTRIL) 20 MG tablet Take 1 tablet (20 mg total) by mouth daily.  . metFORMIN (GLUCOPHAGE) 1000 MG tablet TAKE 1 TABLET BY MOUTH TWICE DAILY WITH A MEAL  . metoprolol tartrate (LOPRESSOR) 50 MG tablet Take 1 tablet (50 mg total) by mouth 2 (two) times  daily.  . oxybutynin (DITROPAN XL) 10 MG 24 hr tablet Take 1 tablet (10 mg total) by mouth at bedtime.  . pregabalin (LYRICA) 100 MG capsule Take 1 capsule (100 mg total) by mouth 3 (three) times daily.  . silver sulfADIAZINE (SILVADENE) 1 % cream Apply 1 application topically 2 (two) times daily.  . traMADol (ULTRAM) 50 MG tablet Take 1 tablet (50 mg total) by mouth at bedtime.   No current facility-administered medications for this visit.  (Other)      REVIEW OF SYSTEMS: ROS    Positive for: Endocrine, Eyes   Negative for: Constitutional, Gastrointestinal, Neurological, Skin, Genitourinary, Musculoskeletal, HENT, Cardiovascular, Respiratory, Psychiatric, Allergic/Imm, Heme/Lymph    Last edited by [name], COA on 01/08/2019  3:11 PM. (History)       ALLERGIES No Known Allergies  PAST MEDICAL HISTORY Past Medical History:  Diagnosis Date  . Bell's palsy 2005  . Cataract   . Diabetes mellitus without complication ([place])   . Diabetic retinopathy ([place])    PDR OU  . Hypertension   . Hypertensive retinopathy    OU  . Neuromuscular disorder ([place])    NEUROPATHY  . Neuropathy   . Sepsis ([place]) 02/11/2016   Past Surgical History:  Procedure Laterality Date  . AMPUTATION Left 03/15/2014   Procedure: AMPUTATION TRANSMETATARSAL;  Surgeon: Newt Minion, MD;  Location: [facility];  Service: Orthopedics;  Laterality: Left;  . CESAREAN SECTION    . CHOLECYSTECTOMY      FAMILY HISTORY Family History  Problem Relation Age of Onset  . Heart attack Mother   . Diabetes Father   . Diabetes Sister     SOCIAL HISTORY Social History   Tobacco Use  . Smoking status: Current Every Day Smoker    Packs/day: 1.00    Years: 38.00    Pack years: 38.00    Types: Cigarettes    Last attempt to quit: 09/19/2013    Years since quitting: 5.3  . Smokeless tobacco: Never Used  Substance Use Topics  . Alcohol use: No  . Drug use: Yes    Types: Marijuana         OPHTHALMIC EXAM:  Base Eye Exam    Visual Acuity (Snellen - Linear)      Right Left   Dist [place] 20/40 20/30   Dist ph [place] 20/25 20/25 +2       Tonometry (Tonopen, 3:14 PM)      Right Left   Pressure 16 17       Pupils      Dark Light Shape React APD   Right 3 2 Round Brisk None   Left 3 2 Round Brisk None       Visual Fields (Counting fingers)      Left Right    Full Full       Extraocular Movement      Right Left    Full, Ortho Full, Ortho       Neuro/Psych    Oriented x3: Yes   Mood/Affect: Normal       Dilation    Both eyes: 1.0% Mydriacyl, 2.5% Phenylephrine @ 3:14 PM        Slit Lamp and Fundus Exam    Slit Lamp Exam      Right Left   Lids/Lashes Dermatochalasis - upper lid  Dermatochalasis - upper lid   Conjunctiva/Sclera nasal and temporal Pinguecula nasal and temporal Pinguecula   Cornea 2-3+ Punctate epithelial erosions, irregular tear film  3+ Punctate epithelial erosions, irregular epi   Anterior Chamber Deep and quiet Deep and quiet   Iris Round and moderately dilated, No NVI Round and dilated, No NVI   Lens 2+ Nuclear sclerosis, 2-3+ Cortical cataract 1-2+ Nuclear sclerosis, 2+ Cortical cataract   Vitreous Vitreous syneresis, RBCs, diffuse VH clearing centrally and settling inferiorly, blood clots inferiorly, Posterior vitreous detachment Vitreous syneresis       Fundus Exam      Right Left   Disc Pink and Sharp Pink and Sharp   C/D Ratio 0.2 0.3   Macula Flat, Blunted foveal reflex, RPE mottling and clumping Flat, good foveal relfex, mild scattered MA, Retinal pigment epithelial mottling, cluster of IRH and NVE sup mac, no edema   Vessels Attenuated, tortuous Vascular attenuation, Tortuous, AV crossing changes   Periphery Attached 360, inferior periphery obscured by VH, 360 PRP w/ good posterior fill in, scattered IRH, focal area of fibrosis inferior to disc with blood clot Attached, scattered IRH, good 360 PRP          IMAGING AND PROCEDURES  Imaging and Procedures for _0 @  OCT, Retina - OU - Both Eyes       Right Eye Quality was good. Central Foveal Thickness: 245. Progression has been stable. Findings include normal foveal contour, no IRF, no SRF, epiretinal membrane, vitreous traction, outer retinal atrophy (Mild Interval improvement in vitreous opacities, tractional edema inferiorly caught on widefield scan -- stable, patchy ORA).   Left Eye Quality was good. Central Foveal Thickness: 263. Progression has been stable. Findings include normal foveal contour, no IRF, no SRF, outer retinal atrophy, vitreomacular adhesion , retinal drusen  (Patchy ORA).   Notes *Images captured and stored on drive  Diagnosis / Impression:  OD: NFP; no  IRF/SRF; +ERM; tractional edema inferior periphery; mild interval improvement in vitreous opacities OS: NFP, no IRF/SRF; patchy ORA  Clinical management:  See below  Abbreviations: NFP - Normal foveal profile. CME - cystoid macular edema. PED - pigment epithelial detachment. IRF - intraretinal fluid. SRF - subretinal fluid. EZ - ellipsoid zone. ERM - epiretinal membrane. ORA - outer retinal atrophy. ORT - outer retinal tubulation. SRHM - subretinal hyper-reflective material        Intravitreal Injection, Pharmacologic Agent - OD - Right Eye       Time Out 01/08/2019. 3:36 PM. Confirmed correct patient, procedure, site, and patient consented.   Anesthesia Topical anesthesia was used. Anesthetic medications included Lidocaine 2%, Proparacaine 0.5%.   Procedure Preparation included 5% betadine to ocular surface, eyelid speculum. A supplied needle was used.   Injection:  1.25 mg Bevacizumab (AVASTIN) SOLN   NDC: 37048-889-16, Lot: [id number] , Expiration date: 02/28/2019   Route: Intravitreal, Site: Right Eye, Waste: 0 mL  Post-op Post injection exam found visual acuity of at least counting fingers. The patient tolerated the procedure well. There were no complications. The patient received written and verbal post procedure care education.                 ASSESSMENT/PLAN:    ICD-10-CM   1. 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 Retinal edema  H35.81 OCT, Retina - OU - Both Eyes  3. Vitreous hemorrhage of right eye ([place])  H43.11   4. Essential hypertension  I10   5. Hypertensive retinopathy of both eyes  H35.033   6. Combined forms of age-related cataract of both eyes  H25.813     1,2. Proliferative diabetic retinopathy w/o DME, OU  - formerly pt of Dr. Noel Journey at [facility]  - s/p [place] 11.15.19 -- has not been seen since then  - pt presented 6.24.20 with progressive vision loss OD since last visit with Dr. Noel Journey in 2019  - OD: diffuse VH  - OS: scattered flat NV  - OCT without diabetic macular edema, both eyes   - B-scan u/s 6.24.20 shows diffuse VH OD; no RT/RD or mass  - s/p PRP OS (06.24.20), fill in (07.07.20)  - s/p PRP OD fill in (08.06.20), fill-in (10.12.20)  - s/p IVA OD #1 (06.24.20), #2 (07.22.20), #3 (08.25.20), #4 (09.28.20)  - FA 9.28.20 shows focal blockage OD from residual VH, no NV; OS with persistent leaking NV -- would likely benefit from fill in PRP OU  - today, BCVA OD improved to 20/25 from 20/30; OS stable at 20/25  - VH clearing OD  - recommend IVA OD today, 10.26.20  - pt wishes to proceed  - RBA of procedure discussed, questions answered  - informed consent obtained and signed  - see procedure note  - f/u 6 weeks, DFE, OCT, possible injection OD, PRP fill in OS   3. Vitreous Hemorrhage OD  - secondary to PDR as above  - s/p IVA OD #1 06.24.20, #2 (07.22.20),  #3 (08.25.20), #4 (09.28.20)  - s/p PRP OD fill in (08.06.20, 10.12.20)  - VH clearing and responding well to IVA  - IVA OD #5 today (10.26.20) as above  - VH precautions reviewed -- minimize activities, keep head elevated, avoid ASA/NSAIDs/blood thinners as able    4,5. Hypertensive retinopathy OU  - discussed importance of tight BP control  - monitor  6. Mixed form age related cataract OU  - The symptoms of cataract, surgical options, and treatments and risks were discussed with patient.  - discussed diagnosis and progression  - not yet visually significant  - monitor for now   Ophthalmic Meds Ordered this visit:  Meds ordered this encounter  Medications  . Bevacizumab (AVASTIN) SOLN 1.25 mg       Return in about 6 weeks (around 02/19/2019) for f/u PDR OU, DFE, OCT.  There are no Patient Instructions on file for this visit.   Explained  the diagnoses, plan, and follow up with the patient and they expressed understanding.  Patient expressed understanding of the importance of proper follow up care.    This document serves as a record of services personally performed by Gardiner Sleeper, MD, PhD. It was created on their behalf by Ernest Mallick, OA, an ophthalmic assistant. The creation of this record is the provider's dictation and/or activities during the visit.    Electronically signed by: Ernest Mallick, OA 10.26.2020 9:31 PM   Gardiner Sleeper, M.D., Ph.D. Diseases & Surgery of the Retina and Vitreous [facility]  I have reviewed the above documentation for accuracy and completeness, and I agree with the above. Gardiner Sleeper, M.D., Ph.D. 01/08/19 9:31 PM    Abbreviations: M myopia (nearsighted); A astigmatism; H hyperopia (farsighted); P presbyopia; Mrx spectacle prescription;  CTL contact lenses; OD right eye; OS left eye; OU both eyes  XT exotropia; ET esotropia; PEK punctate epithelial keratitis; PEE punctate epithelial erosions; DES dry eye syndrome; MGD meibomian gland dysfunction; ATs artificial tears; PFAT's preservative free artificial tears; [place]  nuclear sclerotic cataract; PSC posterior subcapsular cataract; ERM epi-retinal membrane; PVD posterior vitreous detachment; RD retinal detachment; DM diabetes mellitus; DR diabetic retinopathy; NPDR non-proliferative diabetic retinopathy; PDR proliferative diabetic retinopathy; CSME clinically significant macular edema; DME diabetic macular edema; dbh dot blot hemorrhages; CWS cotton wool spot; POAG primary open angle glaucoma; C/D cup-to-disc ratio; HVF humphrey visual field; GVF goldmann visual field; OCT optical coherence tomography; IOP intraocular pressure; BRVO Branch retinal vein occlusion; CRVO central retinal vein occlusion; CRAO central retinal artery occlusion; BRAO branch retinal artery occlusion; RT retinal tear; SB scleral buckle; PPV pars plana  vitrectomy; VH Vitreous hemorrhage; PRP panretinal laser photocoagulation; IVK intravitreal kenalog; VMT vitreomacular traction; MH Macular hole;  NVD neovascularization of the disc; NVE neovascularization elsewhere; AREDS age related eye disease study; ARMD age related macular degeneration; POAG primary open angle glaucoma; EBMD epithelial/anterior basement membrane dystrophy; ACIOL anterior chamber intraocular lens; IOL intraocular lens; PCIOL posterior chamber intraocular lens; Phaco/IOL phacoemulsification with intraocular lens placement; [place] photorefractive keratectomy; LASIK laser assisted in situ keratomileusis; HTN hypertension; DM diabetes mellitus; COPD chronic obstructive pulmonary disease

## 2019-02-19 ENCOUNTER — Encounter (INDEPENDENT_AMBULATORY_CARE_PROVIDER_SITE_OTHER): Payer: Medicare (Managed Care) | Admitting: Ophthalmology

## 2019-02-19 NOTE — Progress Notes (Addendum)
[facility] Note  02/21/2019     CHIEF COMPLAINT Patient presents for Retina Follow Up   HISTORY OF PRESENT ILLNESS: Anna Wagner is a 58 y.o. female who presents to the clinic today for:   HPI    Retina Follow Up    Patient presents with  Diabetic Retinopathy.  In both eyes.  This started weeks ago.  Severity is moderate.  Duration of weeks.  Since onset it is stable.  I, the attending physician,  performed the HPI with the patient and updated documentation appropriately.          Comments    Pt states her vision is the same OU.  She denies eye pain or discomfort and denies any new or worsening floaters or fol OU.       Last edited by [name], MD on 02/21/2019  3:22 PM. (History)    Patient states her vision is doing well, she is using Systane for dry eyes as needed   Referring physician: [name], MD [place],  [place] [address]  HISTORICAL INFORMATION:   Selected notes from the MEDICAL RECORD NUMBER Referred by [facility] Former pt of Dr. Satira Mccallum LEE: 11.15.19 (N. Marchase) [BCVA: OD: 20/60 OS: 20/30] Ocular Hx-NPDR OS, PDR OD, vitreous heme, cataracts OU (pt received IVL OU and PRP OU on 11.15.19)  PMH-DM (A1c: 10.1 05.01.19, takes Humulin and metformin), arthritis, depression, HTN, HLD, neuropathy   CURRENT MEDICATIONS: Current Outpatient Medications (Ophthalmic Drugs)  Medication Sig  . LOTEMAX 0.5 % GEL APPLY 1 DROP TO AFFECTED EYE 4 TIMES A DAY FOR 7 DAYS   No current facility-administered medications for this visit.  (Ophthalmic Drugs)   Current Outpatient Medications (Other)  Medication Sig  . acetaminophen (TYLENOL) 500 MG tablet Take 1,000 mg by mouth every 6 (six) hours as needed for fever.  Marland Kitchen amitriptyline (ELAVIL) 100 MG tablet Take 1 tablet (100 mg total) by mouth at bedtime.  Marland Kitchen aspirin EC 81 MG tablet Take 81 mg by mouth at bedtime.   Marland Kitchen atorvastatin (LIPITOR) 40 MG tablet  Take 1 tablet (40 mg total) by mouth daily.  . cephALEXin (KEFLEX) 500 MG capsule Take 1 capsule (500 mg total) by mouth 2 (two) times daily.  . cetirizine (ZYRTEC) 10 MG tablet Take 1 tablet (10 mg total) by mouth daily.  . clotrimazole (LOTRIMIN) 1 % cream Apply 1 application topically 2 (two) times daily.  . Cyanocobalamin (VITAMIN B-12 PO) Take 1 tablet by mouth 2 (two) times daily.  . DULoxetine (CYMBALTA) 60 MG capsule Take 1 capsule (60 mg total) by mouth daily.  . furosemide (LASIX) 20 MG tablet TAKE 1 TABLET (20 MG TOTAL) BY MOUTH DAILY.  Marland Kitchen guaiFENesin (MUCINEX) 600 MG 12 hr tablet Take 1 tablet (600 mg total) by mouth 2 (two) times daily.  . insulin NPH-regular Human (HUMULIN 70/30) (70-30) 100 UNIT/ML injection Inject subcutaneously twice daily 55 units before breakfast and 60 units dinner  . Insulin Syringe-Needle U-100 (TRUEPLUS INSULIN SYRINGE) 30G X 5/16" 1 ML MISC Use as directed  . lisinopril (PRINIVIL,ZESTRIL) 20 MG tablet Take 1 tablet (20 mg total) by mouth daily.  . metFORMIN (GLUCOPHAGE) 1000 MG tablet TAKE 1 TABLET BY MOUTH TWICE DAILY WITH A MEAL  . metoprolol tartrate (LOPRESSOR) 50 MG tablet Take 1 tablet (50 mg total) by mouth 2 (two) times daily.  Marland Kitchen oxybutynin (DITROPAN XL) 10 MG 24 hr tablet Take 1  tablet (10 mg total) by mouth at bedtime.  . pregabalin (LYRICA) 100 MG capsule Take 1 capsule (100 mg total) by mouth 3 (three) times daily.  . silver sulfADIAZINE (SILVADENE) 1 % cream Apply 1 application topically 2 (two) times daily.  . traMADol (ULTRAM) 50 MG tablet Take 1 tablet (50 mg total) by mouth at bedtime.   No current facility-administered medications for this visit.  (Other)      REVIEW OF SYSTEMS: ROS    Positive for: Endocrine, Eyes   Negative for: Constitutional, Gastrointestinal, Neurological, Skin, Genitourinary, Musculoskeletal, HENT, Cardiovascular, Respiratory, Psychiatric, Allergic/Imm, Heme/Lymph   Last edited by Doneen Poisson on  02/21/2019  1:43 PM. (History)       ALLERGIES No Known Allergies  PAST MEDICAL HISTORY Past Medical History:  Diagnosis Date  . Bell's palsy 2005  . Cataract   . Diabetes mellitus without complication ([place])   . Diabetic retinopathy ([place])    PDR OU  . Hypertension   . Hypertensive retinopathy    OU  . Neuromuscular disorder ([place])    NEUROPATHY  . Neuropathy   . Sepsis ([place]) 02/11/2016   Past Surgical History:  Procedure Laterality Date  . AMPUTATION Left 03/15/2014   Procedure: AMPUTATION TRANSMETATARSAL;  Surgeon: Newt Minion, MD;  Location: [facility];  Service: Orthopedics;  Laterality: Left;  . CESAREAN SECTION    . CHOLECYSTECTOMY      FAMILY HISTORY Family History  Problem Relation Age of Onset  . Heart attack Mother   . Diabetes Father   . Diabetes Sister     SOCIAL HISTORY Social History   Tobacco Use  . Smoking status: Current Every Day Smoker    Packs/day: 1.00    Years: 38.00    Pack years: 38.00    Types: Cigarettes    Last attempt to quit: 09/19/2013    Years since quitting: 5.4  . Smokeless tobacco: Never Used  Substance Use Topics  . Alcohol use: No  . Drug use: Yes    Types: Marijuana         OPHTHALMIC EXAM:  Base Eye Exam    Visual Acuity (Snellen - Linear)      Right Left   Dist [place] 20/50 -2 20/40 -2   Dist ph [place] 20/30 20/25 -2       Tonometry (Tonopen, 1:47 PM)      Right Left   Pressure 17 17       Pupils      Dark Light Shape React APD   Right 3 2 Round Brisk 0   Left 3 2 Round Brisk 0       Visual Fields      Left Right    Full Full       Extraocular Movement      Right Left    Full Full       Neuro/Psych    Oriented x3: Yes   Mood/Affect: Normal       Dilation    Both eyes: 1.0% Mydriacyl, 2.5% Phenylephrine @ 1:47 PM        Slit Lamp and Fundus Exam    Slit Lamp Exam      Right Left   Lids/Lashes Dermatochalasis - upper lid Dermatochalasis - upper lid   Conjunctiva/Sclera nasal and temporal  Pinguecula nasal and temporal Pinguecula   Cornea 2-3+ inferior Punctate epithelial erosions, irregular tear film 2+ Punctate epithelial erosions, irregular epi   Anterior Chamber Deep and quiet Deep and  quiet   Iris Round and moderately dilated, No NVI Round and dilated, No NVI   Lens 2-3+ Nuclear sclerosis, 2-3+ Cortical cataract 2-3+ Nuclear sclerosis, 2-3+ Cortical cataract   Vitreous Vitreous syneresis, RBCs, old VH turning white and settling inferiorly, Posterior vitreous detachment Vitreous syneresis       Fundus Exam      Right Left   Disc Pink and Sharp Pink and Sharp, mild tilt   C/D Ratio 0.2 0.2   Macula Flat, Blunted foveal reflex, RPE mottling and clumping Flat, good foveal relfex, mild scattered MA, Retinal pigment epithelial mottling, cluster of IRH and NVE sup mac, no edema   Vessels Attenuated, tortuous Vascular attenuation, Tortuous   Periphery Attached 360, inferior periphery obscured by old VH, 360 PRP w/ room for posterior fill in if needed, scattered IRH, focal area of fibrosis inferior to disc with blood clot Attached, scattered IRH, good 360 PRP with room for posterior fill in           IMAGING AND PROCEDURES  Imaging and Procedures for _0 @  OCT, Retina - OU - Both Eyes       Right Eye Quality was good. Central Foveal Thickness: 239. Progression has been stable. Findings include normal foveal contour, no IRF, no SRF, epiretinal membrane, vitreous traction, outer retinal atrophy (Persistent vitreous opacities - mild, tractional edema inferiorly caught on widefield scan -- stable, patchy ORA).   Left Eye Quality was good. Central Foveal Thickness: 256. Progression has been stable. Findings include normal foveal contour, no IRF, no SRF, outer retinal atrophy, vitreomacular adhesion , retinal drusen  (Patchy ORA).   Notes *Images captured and stored on drive  Diagnosis / Impression:  OD: NFP; no IRF/SRF; +ERM; tractional edema inferior periphery;  persistent vitreous opacities OS: NFP, no IRF/SRF; patchy ORA  Clinical management:  See below  Abbreviations: NFP - Normal foveal profile. CME - cystoid macular edema. PED - pigment epithelial detachment. IRF - intraretinal fluid. SRF - subretinal fluid. EZ - ellipsoid zone. ERM - epiretinal membrane. ORA - outer retinal atrophy. ORT - outer retinal tubulation. SRHM - subretinal hyper-reflective material        Intravitreal Injection, Pharmacologic Agent - OD - Right Eye       Time Out 02/21/2019. 1:43 PM. Confirmed correct patient, procedure, site, and patient consented.   Anesthesia Topical anesthesia was used. Anesthetic medications included Lidocaine 2%, Proparacaine 0.5%.   Procedure Preparation included 5% betadine to ocular surface, eyelid speculum. A 30 gauge needle was used.   Injection:  1.25 mg Bevacizumab (AVASTIN) SOLN   NDC: 88502-774-12, Lot: [id number] , Expiration date: 03/21/2019   Route: Intravitreal, Site: Right Eye, Waste: 0 mL  Post-op Post injection exam found visual acuity of at least counting fingers. The patient tolerated the procedure well. There were no complications. The patient received written and verbal post procedure care education.                 ASSESSMENT/PLAN:    ICD-10-CM   1. 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 Retinal edema  H35.81 OCT, Retina - OU - Both Eyes  3. Vitreous hemorrhage of right eye ([place])  H43.11   4. Essential hypertension  I10   5. Hypertensive retinopathy of both eyes  H35.033   6. Combined forms of age-related cataract of  both eyes  H25.813     1,2. Proliferative diabetic retinopathy w/o DME, OU  - formerly pt of Dr. Noel [facility] at [facility]  - s/p [place] 11.15.19 -- has not been seen since then  - pt  presented 6.24.20 with progressive vision loss OD since last visit with Dr. Noel Journey in 2019  - OD: diffuse VH  - OS: scattered flat NV  - OCT without diabetic macular edema, both eyes   - B-scan u/s 6.24.20 shows diffuse VH OD; no RT/RD or mass  - s/p PRP OS (06.24.20), fill in (07.07.20)  - s/p PRP OD fill in (08.06.20), fill-in (10.12.20)  - s/p IVA OD #1 (06.24.20), #2 (07.22.20), #3 (08.25.20), #4 (09.28.20), #5 (10.26.20)  - FA 9.28.20 shows focal blockage OD from residual VH, no NV; OS with persistent leaking NV -- would likely benefit from fill in PRP OU  - today, BCVA OD decreased to 20/30 from 20/25; OS stable at 20/25  - VH clearing OD  - recommend IVA OD #6 and PRP OS today, 12.09.20  - pt wishes to proceed with IVA only today, will come back for PRP in January  - RBA of procedure discussed, questions answered  - informed consent obtained and signed  - see procedure note  - f/u 4 weeks, DFE, OCT, possible injection OD, PRP fill in OS   3. Vitreous Hemorrhage OD  - secondary to PDR as above  - s/p IVA OD #1 06.24.20, #2 (07.22.20),  #3 (08.25.20), #4 (09.28.20), # 5 (10.26.20)  - s/p PRP OD fill in (08.06.20, 10.12.20)  - VH clearing and responding well to IVA  - IVA OD #6 today (12.09.20) as above  - VH precautions reviewed -- minimize activities, keep head elevated, avoid ASA/NSAIDs/blood thinners as able   4,5. Hypertensive retinopathy OU  - discussed importance of tight BP control  - monitor  6. Mixed form age related cataract OU  - The symptoms of cataract, surgical options, and treatments and risks were discussed with patient.  - discussed diagnosis and progression  - not yet visually significant  - monitor for now   Ophthalmic Meds Ordered this visit:  Meds ordered this encounter  Medications  . Bevacizumab (AVASTIN) SOLN 1.25 mg       Return in about 4 weeks (around 03/21/2019) for f/u PDR OU, DFE, OCT.  There are no Patient Instructions on file for  this visit.   Explained the diagnoses, plan, and follow up with the patient and they expressed understanding.  Patient expressed understanding of the importance of proper follow up care.   This document serves as a record of services personally performed by Gardiner Sleeper, MD, PhD. It was created on their behalf by Roselee Nova, COMT. The creation of this record is the provider's dictation and/or activities during the visit.  Electronically signed by: Roselee Nova, COMT 02/21/19 6:22 PM   This document serves as a record of services personally performed by Gardiner Sleeper, MD, PhD. It was created on their behalf by Ernest Mallick, OA, an ophthalmic assistant. The creation of this record is the provider's dictation and/or activities during the visit.    Electronically signed by: Ernest Mallick, OA 12.09.2020 6:22 PM  Gardiner Sleeper, M.D., Ph.D. Diseases & Surgery of the Retina and [place] 02/21/2019   I have reviewed the above documentation for accuracy and completeness, and I agree with the above. Gardiner Sleeper, M.D., Ph.D. 02/21/19 6:22  PM    Abbreviations: M myopia (nearsighted); A astigmatism; H hyperopia (farsighted); P presbyopia; Mrx spectacle prescription;  CTL contact lenses; OD right eye; OS left eye; OU both eyes  XT exotropia; ET esotropia; PEK punctate epithelial keratitis; PEE punctate epithelial erosions; DES dry eye syndrome; MGD meibomian gland dysfunction; ATs artificial tears; PFAT's preservative free artificial tears; [place] nuclear sclerotic cataract; PSC posterior subcapsular cataract; ERM epi-retinal membrane; PVD posterior vitreous detachment; RD retinal detachment; DM diabetes mellitus; DR diabetic retinopathy; NPDR non-proliferative diabetic retinopathy; PDR proliferative diabetic retinopathy; CSME clinically significant macular edema; DME diabetic macular edema; dbh dot blot hemorrhages; CWS cotton wool spot; POAG primary open angle  glaucoma; C/D cup-to-disc ratio; HVF humphrey visual field; GVF goldmann visual field; OCT optical coherence tomography; IOP intraocular pressure; BRVO Branch retinal vein occlusion; CRVO central retinal vein occlusion; CRAO central retinal artery occlusion; BRAO branch retinal artery occlusion; RT retinal tear; SB scleral buckle; PPV pars plana vitrectomy; VH Vitreous hemorrhage; PRP panretinal laser photocoagulation; IVK intravitreal kenalog; VMT vitreomacular traction; MH Macular hole;  NVD neovascularization of the disc; NVE neovascularization elsewhere; AREDS age related eye disease study; ARMD age related macular degeneration; POAG primary open angle glaucoma; EBMD epithelial/anterior basement membrane dystrophy; ACIOL anterior chamber intraocular lens; IOL intraocular lens; PCIOL posterior chamber intraocular lens; Phaco/IOL phacoemulsification with intraocular lens placement; [place] photorefractive keratectomy; LASIK laser assisted in situ keratomileusis; HTN hypertension; DM diabetes mellitus; COPD chronic obstructive pulmonary disease

## 2019-02-21 ENCOUNTER — Other Ambulatory Visit: Payer: Self-pay

## 2019-02-21 ENCOUNTER — Encounter (INDEPENDENT_AMBULATORY_CARE_PROVIDER_SITE_OTHER): Payer: Self-pay | Admitting: Ophthalmology

## 2019-02-21 ENCOUNTER — Ambulatory Visit (INDEPENDENT_AMBULATORY_CARE_PROVIDER_SITE_OTHER): Payer: Medicare (Managed Care) | Admitting: Ophthalmology

## 2019-02-21 DIAGNOSIS — H35033 Hypertensive retinopathy, bilateral: Secondary | ICD-10-CM

## 2019-02-21 DIAGNOSIS — H4311 Vitreous hemorrhage, right eye: Secondary | ICD-10-CM

## 2019-02-21 DIAGNOSIS — I10 Essential (primary) hypertension: Secondary | ICD-10-CM

## 2019-02-21 DIAGNOSIS — E113593 Type 2 diabetes mellitus with proliferative diabetic retinopathy without macular edema, bilateral: Secondary | ICD-10-CM | POA: Diagnosis not present

## 2019-02-21 DIAGNOSIS — H3581 Retinal edema: Secondary | ICD-10-CM

## 2019-02-21 DIAGNOSIS — H25813 Combined forms of age-related cataract, bilateral: Secondary | ICD-10-CM

## 2019-02-21 MED ORDER — BEVACIZUMAB CHEMO INJECTION 1.25MG/0.05ML SYRINGE FOR KALEIDOSCOPE
1.2500 mg | INTRAVITREAL | Status: AC | PRN
  Administered 2019-02-21: 1.25 mg via INTRAVITREAL

## 2019-03-06 ENCOUNTER — Other Ambulatory Visit: Payer: Self-pay

## 2019-03-06 DIAGNOSIS — E1149 Type 2 diabetes mellitus with other diabetic neurological complication: Secondary | ICD-10-CM

## 2019-03-06 MED ORDER — PREGABALIN 100 MG PO CAPS: 100.0000 mg | ORAL_CAPSULE | Freq: Three times a day (TID) | ORAL | 0 refills | Status: DC

## 2019-03-21 ENCOUNTER — Other Ambulatory Visit: Payer: Self-pay | Admitting: Family Medicine

## 2019-03-21 ENCOUNTER — Encounter (INDEPENDENT_AMBULATORY_CARE_PROVIDER_SITE_OTHER): Payer: Medicare (Managed Care) | Admitting: Ophthalmology

## 2019-03-21 DIAGNOSIS — E1149 Type 2 diabetes mellitus with other diabetic neurological complication: Secondary | ICD-10-CM

## 2019-03-26 NOTE — Progress Notes (Signed)
[facility] - Clinic Note  03/28/2019     CHIEF COMPLAINT Patient presents for Retina Follow Up   HISTORY OF PRESENT ILLNESS: Anna Wagner is a 59 y.o. female who presents to the clinic today for:   HPI    Retina Follow Up    Patient presents with  Diabetic Retinopathy.  In both eyes.  This started months ago.  Severity is moderate.  Duration of 5 weeks.  Since onset it is stable.  I, the attending physician,  performed the HPI with the patient and updated documentation appropriately.          Comments    59 y/o female pt here for 5 wk f/u for PDR OU.  VA OU seems a little foggier.  Denies pain, flashes, floaters.  Systane prn OU.  BS 172 this a.m.  A1C 7.4 3 mos ago.       Last edited by Rennis Chris, MD on 03/28/2019  8:25 AM. (History)    Patient is doing well, she states she has not had any floaters   Referring physician: Hoy Register, MD [address] [place],  [place] [address]  HISTORICAL INFORMATION:   Selected notes from the MEDICAL RECORD NUMBER Referred by [facility] Former pt of Dr. Scharlene Corn LEE: 11.15.19 (N. Marchase) [BCVA: OD: 20/60 OS: 20/30] Ocular Hx-NPDR OS, PDR OD, vitreous heme, cataracts OU (pt received IVL OU and PRP OU on 11.15.19)  PMH-DM (A1c: 10.1 05.01.19, takes Humulin and metformin), arthritis, depression, HTN, HLD, neuropathy   CURRENT MEDICATIONS: Current Outpatient Medications (Ophthalmic Drugs)  Medication Sig  . LOTEMAX 0.5 % GEL APPLY 1 DROP TO AFFECTED EYE 4 TIMES A DAY FOR 7 DAYS   No current facility-administered medications for this visit. (Ophthalmic Drugs)   Current Outpatient Medications (Other)  Medication Sig  . acetaminophen (TYLENOL) 500 MG tablet Take 1,000 mg by mouth every 6 (six) hours as needed for fever.  Marland Kitchen amitriptyline (ELAVIL) 100 MG tablet Take 1 tablet (100 mg total) by mouth at bedtime.  Marland Kitchen aspirin EC 81 MG tablet Take 81 mg by mouth at bedtime.   Marland Kitchen atorvastatin  (LIPITOR) 40 MG tablet Take 1 tablet (40 mg total) by mouth daily.  . cephALEXin (KEFLEX) 500 MG capsule Take 1 capsule (500 mg total) by mouth 2 (two) times daily.  . cetirizine (ZYRTEC) 10 MG tablet Take 1 tablet (10 mg total) by mouth daily.  . clotrimazole (LOTRIMIN) 1 % cream Apply 1 application topically 2 (two) times daily.  . Cyanocobalamin (VITAMIN B-12 PO) Take 1 tablet by mouth 2 (two) times daily.  . DULoxetine (CYMBALTA) 60 MG capsule Take 1 capsule (60 mg total) by mouth daily.  . furosemide (LASIX) 20 MG tablet TAKE 1 TABLET (20 MG TOTAL) BY MOUTH DAILY.  Marland Kitchen guaiFENesin (MUCINEX) 600 MG 12 hr tablet Take 1 tablet (600 mg total) by mouth 2 (two) times daily.  . insulin NPH-regular Human (HUMULIN 70/30) (70-30) 100 UNIT/ML injection Inject subcutaneously twice daily 55 units before breakfast and 60 units dinner  . Insulin Syringe-Needle U-100 (TRUEPLUS INSULIN SYRINGE) 30G X 5/16" 1 ML MISC Use as directed  . lisinopril (PRINIVIL,ZESTRIL) 20 MG tablet Take 1 tablet (20 mg total) by mouth daily.  Marland Kitchen LYRICA 100 MG capsule Take 1 capsule by mouth 3 times a day as directed by physician.  . metFORMIN (GLUCOPHAGE) 1000 MG tablet TAKE 1 TABLET BY MOUTH TWICE DAILY WITH A MEAL  . metoprolol  tartrate (LOPRESSOR) 50 MG tablet Take 1 tablet (50 mg total) by mouth 2 (two) times daily.  Marland Kitchen oxybutynin (DITROPAN XL) 10 MG 24 hr tablet Take 1 tablet (10 mg total) by mouth at bedtime.  . silver sulfADIAZINE (SILVADENE) 1 % cream Apply 1 application topically 2 (two) times daily.  . traMADol (ULTRAM) 50 MG tablet Take 1 tablet (50 mg total) by mouth at bedtime.   No current facility-administered medications for this visit. (Other)      REVIEW OF SYSTEMS: ROS    Positive for: Endocrine, Eyes   Negative for: Constitutional, Gastrointestinal, Neurological, Skin, Genitourinary, Musculoskeletal, HENT, Cardiovascular, Respiratory, Psychiatric, Allergic/Imm, Heme/Lymph   Last edited by [name],  COA on 03/28/2019  8:11 AM. (History)       ALLERGIES No Known Allergies  PAST MEDICAL HISTORY Past Medical History:  Diagnosis Date  . Bell's palsy 2005  . Cataract   . Diabetes mellitus without complication ([place])   . Diabetic retinopathy ([place])    PDR OU  . Hypertension   . Hypertensive retinopathy    OU  . Neuromuscular disorder ([place])    NEUROPATHY  . Neuropathy   . Sepsis ([place]) 02/11/2016   Past Surgical History:  Procedure Laterality Date  . AMPUTATION Left 03/15/2014   Procedure: AMPUTATION TRANSMETATARSAL;  Surgeon: Newt Minion, MD;  Location: [facility];  Service: Orthopedics;  Laterality: Left;  . CESAREAN SECTION    . CHOLECYSTECTOMY      FAMILY HISTORY Family History  Problem Relation Age of Onset  . Heart attack Mother   . Diabetes Father   . Diabetes Sister     SOCIAL HISTORY Social History   Tobacco Use  . Smoking status: Current Every Day Smoker    Packs/day: 1.00    Years: 38.00    Pack years: 38.00    Types: Cigarettes    Last attempt to quit: 09/19/2013    Years since quitting: 5.5  . Smokeless tobacco: Never Used  Substance Use Topics  . Alcohol use: No  . Drug use: Yes    Types: Marijuana         OPHTHALMIC EXAM:  Base Eye Exam    Visual Acuity (Snellen - Linear)      Right Left   Dist [place] 20/40 -2 20/30 -2   Dist ph [place] 20/30 -2 NI       Tonometry (Tonopen, 8:17 AM)      Right Left   Pressure 14 13       Pupils      Dark Light Shape React APD   Right 3 2 Round Brisk None   Left 3 2 Round Brisk None       Visual Fields (Counting fingers)      Left Right    Full Full       Extraocular Movement      Right Left    Full, Ortho Full, Ortho       Neuro/Psych    Oriented x3: Yes   Mood/Affect: Normal       Dilation    Both eyes: 1.0% Mydriacyl, 2.5% Phenylephrine @ 8:17 AM        Slit Lamp and Fundus Exam    Slit Lamp Exam      Right Left   Lids/Lashes Dermatochalasis - upper lid Dermatochalasis - upper lid    Conjunctiva/Sclera nasal and temporal Pinguecula nasal and temporal Pinguecula   Cornea 2+ inferior Punctate epithelial erosions 2-3+ inferior Punctate epithelial  erosions   Anterior Chamber Deep and quiet Deep and quiet   Iris Round and moderately dilated, No NVI Round and dilated, No NVI   Lens 2-3+ Nuclear sclerosis, 2-3+ Cortical cataract 2-3+ Nuclear sclerosis, 2-3+ Cortical cataract   Vitreous Vitreous syneresis, +RBCs/pigment in anterior vitreous, old VH turning white and settling inferiorly, Posterior vitreous detachment Vitreous syneresis       Fundus Exam      Right Left   Disc Pink and Sharp, mild tilt Pink and Sharp, mild tilt   C/D Ratio 0.3 0.4   Macula Flat, Blunted foveal reflex, RPE mottling and clumping, Drusen Flat, blunted foveal relfex, Retinal pigment epithelial mottling, Drusen, No heme or edema   Vessels Attenuated, tortuous Vascular attenuation, Tortuous   Periphery Attached 360, inferior periphery obscured by old VH, 360 PRP w/ room for posterior fill in if needed, scattered IRH, focal area of fibrosis inferior to disc with blood clot - clearing and improving Attached, good 360 PRP with room for posterior fill in           IMAGING AND PROCEDURES  Imaging and Procedures for @TODAY @  OCT, Retina - OU - Both Eyes       Right Eye Quality was good. Central Foveal Thickness: 241. Progression has improved. Findings include normal foveal contour, no IRF, no SRF, epiretinal membrane, vitreous traction, outer retinal atrophy (Interval improvement in vitreous opacities - mild, tractional edema IT arcades and nasal to disc caught on widefield scan -- stable, patchy ORA).   Left Eye Quality was good. Central Foveal Thickness: 256. Progression has been stable. Findings include normal foveal contour, no IRF, no SRF, outer retinal atrophy, vitreomacular adhesion , retinal drusen  (Patchy ORA).   Notes *Images captured and stored on drive  Diagnosis / Impression:  OD:  NFP; no IRF/SRF; +ERM; tractional edema inferior periphery; interval improvement in vitreous opacities OS: NFP, no IRF/SRF; patchy ORA  Clinical management:  See below  Abbreviations: NFP - Normal foveal profile. CME - cystoid macular edema. PED - pigment epithelial detachment. IRF - intraretinal fluid. SRF - subretinal fluid. EZ - ellipsoid zone. ERM - epiretinal membrane. ORA - outer retinal atrophy. ORT - outer retinal tubulation. SRHM - subretinal hyper-reflective material        Intravitreal Injection, Pharmacologic Agent - OD - Right Eye       Time Out 03/28/2019. 8:58 AM. Confirmed correct patient, procedure, site, and patient consented.   Anesthesia Topical anesthesia was used. Anesthetic medications included Lidocaine 2%, Proparacaine 0.5%.   Procedure Preparation included 5% betadine to ocular surface, eyelid speculum. A supplied (32 g) needle was used.   Injection:  1.25 mg Bevacizumab (AVASTIN) SOLN   NDC: 03/30/2019, Lot: [id number] , Expiration date: 06/13/2019   Route: Intravitreal, Site: Right Eye, Waste: 0 mL  Post-op Post injection exam found visual acuity of at least counting fingers. The patient tolerated the procedure well. There were no complications. The patient received written and verbal post procedure care education.                 ASSESSMENT/PLAN:    ICD-10-CM   1. 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 Retinal edema  H35.81 OCT, Retina - OU - Both Eyes  3. Vitreous hemorrhage of right eye (HCC)  H43.11 Intravitreal Injection, Pharmacologic Agent - OD -  Right Eye    Bevacizumab (AVASTIN) SOLN 1.25 mg  4. Essential hypertension  I10   5. Hypertensive retinopathy of both eyes  H35.033   6. Combined forms of age-related cataract of both eyes  H25.813      1,2. Proliferative diabetic retinopathy w/o DME, OU  - [facility]  - s/p PRP OU 11.15.19 -- has not been seen since then  - pt presented 6.24.20 with progressive vision loss OD since last visit with Dr. Robin SearingMarchase in 2019  - OD: diffuse VH  - OS: scattered flat NV  - OCT without diabetic macular edema, both eyes   - B-scan u/s 6.24.20 shows diffuse VH OD; no RT/RD or mass  - s/p PRP OS (06.24.20), fill in (07.07.20)  - s/p PRP OD fill in (08.06.20), fill-in (10.12.20)  - s/p IVA OD #1 (06.24.20), #2 (07.22.20), #3 (08.25.20), #4 (09.28.20), #5 (10.26.20), #6 (12.09.20)  - FA 9.28.20 shows focal blockage OD from residual VH, no NV; OS with persistent leaking NV -- would likely benefit from fill in PRP OU (OD done 10.12.20)  - today, BCVA OD stable at 20/30; OS slightly decreased to 20/30 from 20/25  - VH clearing OD  - recommend IVA OD #7 today, 01.13.2021  - pt wishes to proceed  - RBA of procedure discussed, questions answered  - informed consent obtained and signed  - see procedure note  - f/u 2-3 weeks, DFE, OCT, FA (transit OD) possible injection OD, PRP fill in OS   3. Vitreous Hemorrhage OD  - secondary to PDR as above  - s/p IVA OD #1 06.24.20, #2 (07.22.20),  #3 (08.25.20), #4 (09.28.20), #5 (10.26.20), #6 (12.09.20)  - s/p PRP OD fill in (08.06.20, 10.12.20)  - VH clearing and responding well to IVA  - IVA OD #7 today (01.13.21) as above  - VH precautions reviewed -- minimize activities, keep head elevated, avoid ASA/NSAIDs/blood thinners as able   4,5. Hypertensive retinopathy OU  - discussed importance of tight BP control  - monitor  6. Mixed form age related cataract OU  - The symptoms of cataract, surgical options, and treatments and risks were discussed with patient.  - discussed diagnosis and progression  - not yet visually significant  - monitor for now   Ophthalmic Meds Ordered this visit:  Meds ordered this  encounter  Medications  . Bevacizumab (AVASTIN) SOLN 1.25 mg       Return for f/u 2-3 weeks, NPDR, DFE, OCT, FA (transit OD).  There are no Patient Instructions on file for this visit.   Explained the diagnoses, plan, and follow up with the patient and they expressed understanding.  Patient expressed understanding of the importance of proper follow up care.    This document serves as a record of services personally performed by Karie ChimeraBrian G. Wayne Wicklund, MD, PhD. It was created on their behalf by Annalee Gentaaryl Barber, COMT. The creation of this record is the provider's dictation and/or activities during the visit.  Electronically signed by: Annalee Gentaaryl Barber, COMT 03/28/19 1:15 PM   This document serves as a record of services personally performed by Karie ChimeraBrian G. Alexande Sheerin, MD, PhD. It was created on their behalf by Laurian BrimAmanda Brown, OA, an ophthalmic assistant. The creation of this record is the provider's dictation and/or activities during the visit.    Electronically signed by: Laurian BrimAmanda Brown, OA 01.13.2021 1:15 PM   Karie ChimeraBrian G. Braxdon Gappa, M.D., Ph.D. Diseases & Surgery of the Retina and Vitreous [facility] 03/28/2019   I have reviewed the above documentation for accuracy and completeness, and I agree with the above. Karie Chimera, M.D., Ph.D. 03/28/19 1:15 PM      Abbreviations: M myopia (nearsighted); A astigmatism; H hyperopia (farsighted); P presbyopia; Mrx spectacle prescription;  CTL contact lenses; OD right eye; OS left eye; OU both eyes  XT exotropia; ET esotropia; PEK punctate epithelial keratitis; PEE punctate epithelial erosions; DES dry eye syndrome; MGD meibomian gland dysfunction; ATs artificial tears; PFAT's preservative free artificial tears; NSC nuclear sclerotic cataract; PSC posterior subcapsular cataract; ERM epi-retinal membrane; PVD posterior vitreous detachment; RD retinal detachment; DM diabetes mellitus; DR diabetic retinopathy; NPDR non-proliferative diabetic  retinopathy; PDR proliferative diabetic retinopathy; CSME clinically significant macular edema; DME diabetic macular edema; dbh dot blot hemorrhages; CWS cotton wool spot; POAG primary open angle glaucoma; C/D cup-to-disc ratio; HVF humphrey visual field; GVF goldmann visual field; OCT optical coherence tomography; IOP intraocular pressure; BRVO Branch retinal vein occlusion; CRVO central retinal vein occlusion; CRAO central retinal artery occlusion; BRAO branch retinal artery occlusion; RT retinal tear; SB scleral buckle; PPV pars plana vitrectomy; VH Vitreous hemorrhage; PRP panretinal laser photocoagulation; IVK intravitreal kenalog; VMT vitreomacular traction; MH Macular hole;  NVD neovascularization of the disc; NVE neovascularization elsewhere; AREDS age related eye disease study; ARMD age related macular degeneration; POAG primary open angle glaucoma; EBMD epithelial/anterior basement membrane dystrophy; ACIOL anterior chamber intraocular lens; IOL intraocular lens; PCIOL posterior chamber intraocular lens; Phaco/IOL phacoemulsification with intraocular lens placement; PRK photorefractive keratectomy; LASIK laser assisted in situ keratomileusis; HTN hypertension; DM diabetes mellitus; COPD chronic obstructive pulmonary disease

## 2019-03-28 ENCOUNTER — Encounter (INDEPENDENT_AMBULATORY_CARE_PROVIDER_SITE_OTHER): Payer: Self-pay | Admitting: Ophthalmology

## 2019-03-28 ENCOUNTER — Other Ambulatory Visit: Payer: Self-pay

## 2019-03-28 ENCOUNTER — Ambulatory Visit (INDEPENDENT_AMBULATORY_CARE_PROVIDER_SITE_OTHER): Payer: Medicare (Managed Care) | Admitting: Ophthalmology

## 2019-03-28 DIAGNOSIS — H3581 Retinal edema: Secondary | ICD-10-CM | POA: Diagnosis not present

## 2019-03-28 DIAGNOSIS — H4311 Vitreous hemorrhage, right eye: Secondary | ICD-10-CM | POA: Diagnosis not present

## 2019-03-28 DIAGNOSIS — E113593 Type 2 diabetes mellitus with proliferative diabetic retinopathy without macular edema, bilateral: Secondary | ICD-10-CM | POA: Diagnosis not present

## 2019-03-28 DIAGNOSIS — I10 Essential (primary) hypertension: Secondary | ICD-10-CM | POA: Diagnosis not present

## 2019-03-28 DIAGNOSIS — H35033 Hypertensive retinopathy, bilateral: Secondary | ICD-10-CM

## 2019-03-28 DIAGNOSIS — H25813 Combined forms of age-related cataract, bilateral: Secondary | ICD-10-CM

## 2019-03-28 MED ORDER — BEVACIZUMAB CHEMO INJECTION 1.25MG/0.05ML SYRINGE FOR KALEIDOSCOPE
1.2500 mg | INTRAVITREAL | Status: AC | PRN
  Administered 2019-03-28: 1.25 mg via INTRAVITREAL

## 2019-04-12 NOTE — Progress Notes (Signed)
[facility] - Clinic Note  04/18/2019     CHIEF COMPLAINT Patient presents for Retina Follow Up   HISTORY OF PRESENT ILLNESS: Anna Wagner is a 59 y.o. female who presents to the clinic today for:   HPI    Retina Follow Up    Patient presents with  Diabetic Retinopathy.  In both eyes.  This started 3 weeks ago.  Severity is moderate.  I, the attending physician,  performed the HPI with the patient and updated documentation appropriately.          Comments    Patient here for 3 weeks retina follow up for NPDR. Patient states vision is hazy feeling. In the AM vision good. Towards evening starts getting bad. No eye pain.        Last edited by Rennis Chris, MD on 04/18/2019 10:02 AM. (History)    Patient is doing well, she states she has not had any floaters   Referring physician: Hoy Register, MD [address] [place],  [place] [address]  HISTORICAL INFORMATION:   Selected notes from the MEDICAL RECORD NUMBER Referred by [facility] Former pt of Dr. Scharlene Corn LEE: 11.15.19 (N. Marchase) [BCVA: OD: 20/60 OS: 20/30] Ocular Hx-NPDR OS, PDR OD, vitreous heme, cataracts OU (pt received IVL OU and PRP OU on 11.15.19)  PMH-DM (A1c: 10.1 05.01.19, takes Humulin and metformin), arthritis, depression, HTN, HLD, neuropathy   CURRENT MEDICATIONS: Current Outpatient Medications (Ophthalmic Drugs)  Medication Sig  . LOTEMAX 0.5 % GEL APPLY 1 DROP TO AFFECTED EYE 4 TIMES A DAY FOR 7 DAYS   No current facility-administered medications for this visit. (Ophthalmic Drugs)   Current Outpatient Medications (Other)  Medication Sig  . acetaminophen (TYLENOL) 500 MG tablet Take 1,000 mg by mouth every 6 (six) hours as needed for fever.  Marland Kitchen amitriptyline (ELAVIL) 100 MG tablet Take 1 tablet (100 mg total) by mouth at bedtime.  Marland Kitchen aspirin EC 81 MG tablet Take 81 mg by mouth at bedtime.   Marland Kitchen atorvastatin (LIPITOR) 40 MG tablet Take 1 tablet (40 mg total) by  mouth daily.  . cephALEXin (KEFLEX) 500 MG capsule Take 1 capsule (500 mg total) by mouth 2 (two) times daily.  . cetirizine (ZYRTEC) 10 MG tablet Take 1 tablet (10 mg total) by mouth daily.  . clotrimazole (LOTRIMIN) 1 % cream Apply 1 application topically 2 (two) times daily.  . Cyanocobalamin (VITAMIN B-12 PO) Take 1 tablet by mouth 2 (two) times daily.  . DULoxetine (CYMBALTA) 60 MG capsule Take 1 capsule (60 mg total) by mouth daily.  . furosemide (LASIX) 20 MG tablet TAKE 1 TABLET (20 MG TOTAL) BY MOUTH DAILY.  Marland Kitchen guaiFENesin (MUCINEX) 600 MG 12 hr tablet Take 1 tablet (600 mg total) by mouth 2 (two) times daily.  . insulin NPH-regular Human (HUMULIN 70/30) (70-30) 100 UNIT/ML injection Inject subcutaneously twice daily 55 units before breakfast and 60 units dinner  . Insulin Syringe-Needle U-100 (TRUEPLUS INSULIN SYRINGE) 30G X 5/16" 1 ML MISC Use as directed  . lisinopril (PRINIVIL,ZESTRIL) 20 MG tablet Take 1 tablet (20 mg total) by mouth daily.  Marland Kitchen LYRICA 100 MG capsule Take 1 capsule by mouth 3 times a day as directed by physician.  . metFORMIN (GLUCOPHAGE) 1000 MG tablet TAKE 1 TABLET BY MOUTH TWICE DAILY WITH A MEAL  . metoprolol tartrate (LOPRESSOR) 50 MG tablet Take 1 tablet (50 mg total) by mouth 2 (two) times daily.  Marland Kitchen oxybutynin (  DITROPAN XL) 10 MG 24 hr tablet Take 1 tablet (10 mg total) by mouth at bedtime.  . silver sulfADIAZINE (SILVADENE) 1 % cream Apply 1 application topically 2 (two) times daily.  . traMADol (ULTRAM) 50 MG tablet Take 1 tablet (50 mg total) by mouth at bedtime.   No current facility-administered medications for this visit. (Other)      REVIEW OF SYSTEMS: ROS    Positive for: Endocrine, Eyes   Negative for: Constitutional, Gastrointestinal, Neurological, Skin, Genitourinary, Musculoskeletal, HENT, Cardiovascular, Respiratory, Psychiatric, Allergic/Imm, Heme/Lymph   Last edited by Laddie Aquas, COA on 04/18/2019  8:46 AM. (History)        ALLERGIES No Known Allergies  PAST MEDICAL HISTORY Past Medical History:  Diagnosis Date  . Bell's palsy 2005  . Cataract   . Diabetes mellitus without complication (HCC)   . Diabetic retinopathy (HCC)    PDR OU  . Hypertension   . Hypertensive retinopathy    OU  . Neuromuscular disorder (HCC)    NEUROPATHY  . Neuropathy   . Sepsis (HCC) 02/11/2016   Past Surgical History:  Procedure Laterality Date  . AMPUTATION Left 03/15/2014   Procedure: AMPUTATION TRANSMETATARSAL;  Surgeon: Nadara Mustard, MD;  Location: [facility] OR;  Service: Orthopedics;  Laterality: Left;  . CESAREAN SECTION    . CHOLECYSTECTOMY      FAMILY HISTORY Family History  Problem Relation Age of Onset  . Heart attack Mother   . Diabetes Father   . Diabetes Sister     SOCIAL HISTORY Social History   Tobacco Use  . Smoking status: Current Every Day Smoker    Packs/day: 1.00    Years: 38.00    Pack years: 38.00    Types: Cigarettes    Last attempt to quit: 09/19/2013    Years since quitting: 5.5  . Smokeless tobacco: Never Used  Substance Use Topics  . Alcohol use: No  . Drug use: Yes    Types: Marijuana         OPHTHALMIC EXAM:  Base Eye Exam    Visual Acuity (Snellen - Linear)      Right Left   Dist [place] 20/40 -2 20/30 +2   Dist ph [place] 20/20 20/20 -2       Tonometry (Tonopen, 8:42 AM)      Right Left   Pressure 21 19       Pupils      Dark Light Shape React APD   Right 3 2 Round Brisk None   Left 3 2 Round Brisk None       Visual Fields (Counting fingers)      Left Right    Full Full       Extraocular Movement      Right Left    Full, Ortho Full, Ortho       Neuro/Psych    Oriented x3: Yes   Mood/Affect: Normal       Dilation    Both eyes: 1.0% Mydriacyl, 2.5% Phenylephrine @ 8:42 AM        Slit Lamp and Fundus Exam    Slit Lamp Exam      Right Left   Lids/Lashes Dermatochalasis - upper lid Dermatochalasis - upper lid   Conjunctiva/Sclera nasal and temporal  Pinguecula nasal and temporal Pinguecula   Cornea 2+ inferior Punctate epithelial erosions 2-3+ inferior Punctate epithelial erosions   Anterior Chamber Deep and quiet Deep and quiet   Iris Round and moderately dilated, No NVI  Round and dilated, No NVI   Lens 2-3+ Nuclear sclerosis, 2-3+ Cortical cataract 2-3+ Nuclear sclerosis, 2-3+ Cortical cataract   Vitreous Vitreous syneresis, +RBCs/pigment in anterior vitreous, old VH turning white and settling inferiorly, Posterior vitreous detachment Vitreous syneresis       Fundus Exam      Right Left   Disc Pink and Sharp, mild tilt Pink and Sharp, mild tilt   C/D Ratio 0.3 0.4   Macula Flat, Blunted foveal reflex, RPE mottling and clumping, Drusen Flat, blunted foveal relfex, Retinal pigment epithelial mottling, Drusen, No heme or edema   Vessels Attenuated, tortuous Vascular attenuation, Tortuous   Periphery Attached 360, inferior periphery obscured by old VH, 360 PRP w/ room for posterior fill in if needed, scattered IRH, focal area of fibrosis inferior to disc with blood clot - clearing and improving Attached, good 360 PRP with room for posterior fill in           IMAGING AND PROCEDURES  Imaging and Procedures for @TODAY @  OCT, Retina - OU - Both Eyes       Right Eye Quality was good. Central Foveal Thickness: 239. Progression has been stable. Findings include normal foveal contour, no IRF, no SRF, epiretinal membrane, vitreous traction, outer retinal atrophy (Mild vitreous opacities - mild, tractional edema IT arcades and nasal to disc caught on widefield scan -- stable, patchy ORA).   Left Eye Quality was good. Central Foveal Thickness: 268. Progression has been stable. Findings include normal foveal contour, no IRF, no SRF, outer retinal atrophy, vitreomacular adhesion , retinal drusen  (Patchy ORA).   Notes *Images captured and stored on drive  Diagnosis / Impression:  OD: NFP; no IRF/SRF; +ERM; tractional edema inferior  periphery; interval improvement in vitreous opacities OS: NFP, no IRF/SRF; patchy ORA  Clinical management:  See below  Abbreviations: NFP - Normal foveal profile. CME - cystoid macular edema. PED - pigment epithelial detachment. IRF - intraretinal fluid. SRF - subretinal fluid. EZ - ellipsoid zone. ERM - epiretinal membrane. ORA - outer retinal atrophy. ORT - outer retinal tubulation. SRHM - subretinal hyper-reflective material        Fluorescein Angiography Optos (Transit OD)       Right Eye   Progression has no prior data. Early phase findings include delayed filling, leakage, staining, blockage (Mildly delayed filling). Mid/Late phase findings include blockage, staining, leakage (No NV).   Left Eye   Progression has no prior data. Early phase findings include microaneurysm, leakage, staining, retinal neovascularization, vascular perfusion defect. Mid/Late phase findings include microaneurysm, retinal neovascularization, leakage, staining (Persistent NV with leakage).   Notes Images stored on drive;   Impression: PDR OU OD: no active NV OS: +active NV -- persistent         Panretinal Photocoagulation - OS - Left Eye       LASER PROCEDURE NOTE  Diagnosis:   Proliferative Diabetic Retinopathy, LEFT EYE  Procedure:  Pan-retinal photocoagulation using slit lamp laser, LEFT EYE, fill-in  Anesthesia:  Topical  Surgeon: [name], MD, PhD   Informed consent obtained, operative eye marked, and time out performed prior to initiation of laser.   Lumenis [id number] slit lamp laser Pattern: 2x2 square Power: 320 mW Duration: 50 msec  Spot size: 200 microns  # spots: 718 spots focal fill-in surrounding areas of flat NV (7 areas just outside arcades / posterior midzone)  Complications: None.  RTC: 2-3 wks  Patient tolerated the procedure well and received written and verbal post-procedure care  information/education.                   ASSESSMENT/PLAN:    ICD-10-CM   1. 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 Retinal edema  H35.81 OCT, Retina - OU - Both Eyes  3. Vitreous hemorrhage of right eye (HCC)  H43.11   4. Essential hypertension  I10   5. Hypertensive retinopathy of both eyes  H35.033   6. Combined forms of age-related cataract of both eyes  H25.813     1,2. Proliferative diabetic retinopathy w/o DME, OU  - [facility]  - s/p PRP OU 11.15.19 -- has not been seen since then  - pt presented 6.24.20 with progressive vision loss OD since last visit with Dr. Robin SearingMarchase in 2019  - OD: diffuse VH  - OS: scattered flat NV  - OCT without diabetic macular edema, both eyes   - B-scan u/s 6.24.20 shows diffuse VH OD; no RT/RD or mass  - s/p PRP OS (06.24.20), fill in (07.07.20)  - s/p PRP OD fill in (08.06.20), fill-in (10.12.20)  - s/p IVA OD #1 (06.24.20), #2 (07.22.20), #3 (08.25.20), #4 (09.28.20), #5 (10.26.20), #6 (12.09.20), #7 (01.13.21)  - FA 2.3.21 shows focal blockage OD from residual VH improved, no NV; OS with persistent leaking NV  - today, BCVA 20/20 OU  - VH clearing OD  - recommend fill in PRP OS today, 02.03.2021  - pt wishes to proceed with PRP  - RBA of procedure discussed, questions answered  - informed consent obtained and signed  - see procedure note -- focused PRP fill in around areas of leaking NV  - f/u 2-3 weeks, DFE, OCT, possible injection OD   3. Vitreous Hemorrhage OD  - secondary to PDR as above  - s/p IVA OD #1 06.24.20, #2 (07.22.20),  #3 (08.25.20), #4 (09.28.20), #5 (10.26.20), #6 (12.09.20)  - s/p PRP OD fill in (08.06.20, 10.12.20)   - VH clearing and responding well to IVA  - last  IVA OD #7 (01.13.21) as above  - VH precautions reviewed -- minimize activities, keep head elevated, avoid ASA/NSAIDs/blood thinners as able   4,5. Hypertensive retinopathy OU  - discussed importance of tight BP control  - monitor  6. Mixed form age related cataract OU  - The symptoms of cataract, surgical options, and treatments and risks were discussed with patient.  - discussed diagnosis and progression  - not yet visually significant  - monitor for now   Ophthalmic Meds Ordered this visit:  No orders of the defined types were placed in this encounter.      Return for f/u 2-3 weeks, DFE, OCT.  There are no Patient Instructions on file for this visit.   Explained the diagnoses, plan, and follow up with the patient and they expressed understanding.  Patient expressed understanding of the importance of proper follow up care.    This document serves as a record of services personally performed by Karie ChimeraBrian G. Tkeyah Burkman, MD, PhD. It was created on their behalf by Annalee Gentaaryl Barber, COMT. The creation of this record is the provider's dictation and/or activities during the visit.  Electronically signed by: Annalee Gentaaryl Barber, COMT 04/18/19 1:54 PM   Karie ChimeraBrian G. Shariyah Eland,  M.D., Ph.D. Diseases & Surgery of the Retina and Vitreous [facility] 04/18/2019   I have reviewed the above documentation for accuracy and completeness, and I agree with the above. Karie Chimera, M.D., Ph.D. 04/18/19 1:54 PM   Abbreviations: M myopia (nearsighted); A astigmatism; H hyperopia (farsighted); P presbyopia; Mrx spectacle prescription;  CTL contact lenses; OD right eye; OS left eye; OU both eyes  XT exotropia; ET esotropia; PEK punctate epithelial keratitis; PEE punctate epithelial erosions; DES dry eye syndrome; MGD meibomian gland dysfunction; ATs artificial tears; PFAT's preservative free artificial tears; NSC nuclear sclerotic cataract; PSC posterior subcapsular cataract; ERM epi-retinal  membrane; PVD posterior vitreous detachment; RD retinal detachment; DM diabetes mellitus; DR diabetic retinopathy; NPDR non-proliferative diabetic retinopathy; PDR proliferative diabetic retinopathy; CSME clinically significant macular edema; DME diabetic macular edema; dbh dot blot hemorrhages; CWS cotton wool spot; POAG primary open angle glaucoma; C/D cup-to-disc ratio; HVF humphrey visual field; GVF goldmann visual field; OCT optical coherence tomography; IOP intraocular pressure; BRVO Branch retinal vein occlusion; CRVO central retinal vein occlusion; CRAO central retinal artery occlusion; BRAO branch retinal artery occlusion; RT retinal tear; SB scleral buckle; PPV pars plana vitrectomy; VH Vitreous hemorrhage; PRP panretinal laser photocoagulation; IVK intravitreal kenalog; VMT vitreomacular traction; MH Macular hole;  NVD neovascularization of the disc; NVE neovascularization elsewhere; AREDS age related eye disease study; ARMD age related macular degeneration; POAG primary open angle glaucoma; EBMD epithelial/anterior basement membrane dystrophy; ACIOL anterior chamber intraocular lens; IOL intraocular lens; PCIOL posterior chamber intraocular lens; Phaco/IOL phacoemulsification with intraocular lens placement; PRK photorefractive keratectomy; LASIK laser assisted in situ keratomileusis; HTN hypertension; DM diabetes mellitus; COPD chronic obstructive pulmonary disease

## 2019-04-18 ENCOUNTER — Ambulatory Visit (INDEPENDENT_AMBULATORY_CARE_PROVIDER_SITE_OTHER): Payer: Medicare (Managed Care) | Admitting: Ophthalmology

## 2019-04-18 ENCOUNTER — Other Ambulatory Visit: Payer: Self-pay

## 2019-04-18 ENCOUNTER — Encounter (INDEPENDENT_AMBULATORY_CARE_PROVIDER_SITE_OTHER): Payer: Self-pay | Admitting: Ophthalmology

## 2019-04-18 DIAGNOSIS — H4311 Vitreous hemorrhage, right eye: Secondary | ICD-10-CM

## 2019-04-18 DIAGNOSIS — E113593 Type 2 diabetes mellitus with proliferative diabetic retinopathy without macular edema, bilateral: Secondary | ICD-10-CM

## 2019-04-18 DIAGNOSIS — H3581 Retinal edema: Secondary | ICD-10-CM | POA: Diagnosis not present

## 2019-04-18 DIAGNOSIS — I10 Essential (primary) hypertension: Secondary | ICD-10-CM

## 2019-04-18 DIAGNOSIS — H25813 Combined forms of age-related cataract, bilateral: Secondary | ICD-10-CM

## 2019-04-18 DIAGNOSIS — H35033 Hypertensive retinopathy, bilateral: Secondary | ICD-10-CM

## 2019-05-07 NOTE — Progress Notes (Signed)
[facility] - Clinic Note  05/09/2019     CHIEF COMPLAINT Patient presents for Retina Follow Up   HISTORY OF PRESENT ILLNESS: Anna Wagner is a 59 y.o. female who presents to the clinic today for:   HPI    Retina Follow Up    Patient presents with  Diabetic Retinopathy.  In both eyes.  Severity is moderate.  Duration of 3 weeks.  Since onset it is stable.  I, the attending physician,  performed the HPI with the patient and updated documentation appropriately.          Comments    Patient states vision still blurred OU. Using AT's prn OU. Had to R/S appointment with Dr. Dione Booze due to weather. Is seeing Dr. Dione Booze sometime next month.       Last edited by Rennis Chris, MD on 05/11/2019  1:55 PM. (History)    Patient is doing well, she states she has not had any floaters   Referring physician: Hoy Register, MD [address] [place],  [place] [address]  HISTORICAL INFORMATION:   Selected notes from the MEDICAL RECORD NUMBER Referred by [facility] Former pt of Dr. Scharlene Corn LEE: 11.15.19 (N. Marchase) [BCVA: OD: 20/60 OS: 20/30] Ocular Hx-NPDR OS, PDR OD, vitreous heme, cataracts OU (pt received IVL OU and PRP OU on 11.15.19)  PMH-DM (A1c: 10.1 05.01.19, takes Humulin and metformin), arthritis, depression, HTN, HLD, neuropathy   CURRENT MEDICATIONS: Current Outpatient Medications (Ophthalmic Drugs)  Medication Sig  . LOTEMAX 0.5 % GEL APPLY 1 DROP TO AFFECTED EYE 4 TIMES A DAY FOR 7 DAYS   No current facility-administered medications for this visit. (Ophthalmic Drugs)   Current Outpatient Medications (Other)  Medication Sig  . acetaminophen (TYLENOL) 500 MG tablet Take 1,000 mg by mouth every 6 (six) hours as needed for fever.  Marland Kitchen amitriptyline (ELAVIL) 100 MG tablet Take 1 tablet (100 mg total) by mouth at bedtime.  Marland Kitchen aspirin EC 81 MG tablet Take 81 mg by mouth at bedtime.   Marland Kitchen atorvastatin (LIPITOR) 40 MG tablet Take 1 tablet (40  mg total) by mouth daily.  . cephALEXin (KEFLEX) 500 MG capsule Take 1 capsule (500 mg total) by mouth 2 (two) times daily.  . cetirizine (ZYRTEC) 10 MG tablet Take 1 tablet (10 mg total) by mouth daily.  . clotrimazole (LOTRIMIN) 1 % cream Apply 1 application topically 2 (two) times daily.  . Cyanocobalamin (VITAMIN B-12 PO) Take 1 tablet by mouth 2 (two) times daily.  . DULoxetine (CYMBALTA) 60 MG capsule Take 1 capsule (60 mg total) by mouth daily.  . furosemide (LASIX) 20 MG tablet TAKE 1 TABLET (20 MG TOTAL) BY MOUTH DAILY.  Marland Kitchen guaiFENesin (MUCINEX) 600 MG 12 hr tablet Take 1 tablet (600 mg total) by mouth 2 (two) times daily.  . insulin NPH-regular Human (HUMULIN 70/30) (70-30) 100 UNIT/ML injection Inject subcutaneously twice daily 55 units before breakfast and 60 units dinner  . Insulin Syringe-Needle U-100 (TRUEPLUS INSULIN SYRINGE) 30G X 5/16" 1 ML MISC Use as directed  . lisinopril (PRINIVIL,ZESTRIL) 20 MG tablet Take 1 tablet (20 mg total) by mouth daily.  Marland Kitchen LYRICA 100 MG capsule Take 1 capsule by mouth 3 times a day as directed by physician.  . metFORMIN (GLUCOPHAGE) 1000 MG tablet TAKE 1 TABLET BY MOUTH TWICE DAILY WITH A MEAL  . metoprolol tartrate (LOPRESSOR) 50 MG tablet Take 1 tablet (50 mg total) by mouth 2 (two) times daily.  Marland Kitchen  oxybutynin (DITROPAN XL) 10 MG 24 hr tablet Take 1 tablet (10 mg total) by mouth at bedtime.  . silver sulfADIAZINE (SILVADENE) 1 % cream Apply 1 application topically 2 (two) times daily.  . traMADol (ULTRAM) 50 MG tablet Take 1 tablet (50 mg total) by mouth at bedtime.   No current facility-administered medications for this visit. (Other)      REVIEW OF SYSTEMS: ROS    Positive for: Endocrine, Eyes   Negative for: Constitutional, Gastrointestinal, Neurological, Skin, Genitourinary, Musculoskeletal, HENT, Cardiovascular, Respiratory, Psychiatric, Allergic/Imm, Heme/Lymph   Last edited by Doreene Nest, COT on 05/09/2019  9:33 AM. (History)        ALLERGIES No Known Allergies  PAST MEDICAL HISTORY Past Medical History:  Diagnosis Date  . Bell's palsy 2005  . Cataract   . Diabetes mellitus without complication (HCC)   . Diabetic retinopathy (HCC)    PDR OU  . Hypertension   . Hypertensive retinopathy    OU  . Neuromuscular disorder (HCC)    NEUROPATHY  . Neuropathy   . Sepsis (HCC) 02/11/2016   Past Surgical History:  Procedure Laterality Date  . AMPUTATION Left 03/15/2014   Procedure: AMPUTATION TRANSMETATARSAL;  Surgeon: Nadara Mustard, MD;  Location: [facility] OR;  Service: Orthopedics;  Laterality: Left;  . CESAREAN SECTION    . CHOLECYSTECTOMY      FAMILY HISTORY Family History  Problem Relation Age of Onset  . Heart attack Mother   . Diabetes Father   . Diabetes Sister     SOCIAL HISTORY Social History   Tobacco Use  . Smoking status: Current Every Day Smoker    Packs/day: 1.00    Years: 38.00    Pack years: 38.00    Types: Cigarettes    Last attempt to quit: 09/19/2013    Years since quitting: 5.6  . Smokeless tobacco: Never Used  Substance Use Topics  . Alcohol use: No  . Drug use: Yes    Types: Marijuana         OPHTHALMIC EXAM:  Base Eye Exam    Visual Acuity (Snellen - Linear)      Right Left   Dist [place] 20/40 -2 20/30 -1   Dist ph [place] 20/25 +1 20/25       Tonometry (Tonopen, 9:41 AM)      Right Left   Pressure 18 16       Pupils      Dark Light Shape React APD   Right 3 2 Round Brisk None   Left 3 2 Round Brisk None       Visual Fields (Counting fingers)      Left Right    Full Full       Extraocular Movement      Right Left    Full, Ortho Full, Ortho       Neuro/Psych    Oriented x3: Yes   Mood/Affect: Normal       Dilation    Both eyes: 1.0% Mydriacyl, 2.5% Phenylephrine @ 9:41 AM        Slit Lamp and Fundus Exam    Slit Lamp Exam      Right Left   Lids/Lashes Dermatochalasis - upper lid Dermatochalasis - upper lid   Conjunctiva/Sclera nasal and temporal  Pinguecula nasal and temporal Pinguecula   Cornea 2+ inferior Punctate epithelial erosions 2-3+ inferior Punctate epithelial erosions   Anterior Chamber Deep and quiet Deep and quiet   Iris Round and moderately dilated, No  NVI Round and dilated, No NVI   Lens 2-3+ Nuclear sclerosis, 2-3+ Cortical cataract 2-3+ Nuclear sclerosis, 2-3+ Cortical cataract   Vitreous Vitreous syneresis, +RBCs/pigment in anterior vitreous, old VH turning white and settling inferiorly, Posterior vitreous detachment Vitreous syneresis       Fundus Exam      Right Left   Disc Pink and Sharp, mild tilt Pink and Sharp, mild tilt   C/D Ratio 0.3 0.4   Macula Flat, Blunted foveal reflex, RPE mottling and clumping, Drusen Flat, blunted foveal relfex, Retinal pigment epithelial mottling, Drusen, No heme or edema   Vessels Attenuated, tortuous Vascular attenuation, Tortuous   Periphery Attached 360, inferior periphery obscured by old VH--white and improving, 360 PRP w/ room for posterior fill in if needed, scattered IRH, focal area of fibrosis inferior to disc with blood clot - clearing and improving Attached, good 360 PRP with good posterior fill in changes, new sub hyaloid heme inferior and nasal to disc          IMAGING AND PROCEDURES  Imaging and Procedures for @TODAY @  OCT, Retina - OU - Both Eyes       Right Eye Quality was good. Central Foveal Thickness: 240. Progression has improved. Findings include normal foveal contour, no IRF, no SRF, epiretinal membrane, vitreous traction, outer retinal atrophy (Mild interval improvement in vitreous opacities).   Left Eye Quality was good. Central Foveal Thickness: 273. Progression has worsened. Findings include normal foveal contour, no IRF, no SRF, outer retinal atrophy, vitreomacular adhesion , retinal drusen  (Patchy ORA, new sub hyaloid heme on widefield).   Notes *Images captured and stored on drive  Diagnosis / Impression:  OD: NFP; no IRF/SRF; +ERM;  tractional edema inferior periphery; mild interval improvement in vitreous opacities OS: NFP, no IRF/SRF; patchy ORA, new sub-hyaloid heme caught on widefield  Clinical management:  See below  Abbreviations: NFP - Normal foveal profile. CME - cystoid macular edema. PED - pigment epithelial detachment. IRF - intraretinal fluid. SRF - subretinal fluid. EZ - ellipsoid zone. ERM - epiretinal membrane. ORA - outer retinal atrophy. ORT - outer retinal tubulation. SRHM - subretinal hyper-reflective material        Intravitreal Injection, Pharmacologic Agent - OD - Right Eye       Time Out 05/09/2019. 9:47 AM. Confirmed correct patient, procedure, site, and patient consented.   Anesthesia Topical anesthesia was used. Anesthetic medications included Lidocaine 2%, Proparacaine 0.5%.   Procedure Preparation included 5% betadine to ocular surface, eyelid speculum. A supplied needle was used.   Injection:  1.25 mg Bevacizumab (AVASTIN) SOLN   NDC: 27062-376-28, Lot: [id number] , Expiration date: 05/30/2019   Route: Intravitreal, Site: Right Eye, Waste: 0 mL  Post-op Post injection exam found visual acuity of at least counting fingers. The patient tolerated the procedure well. There were no complications. The patient received written and verbal post procedure care education.        Intravitreal Injection, Pharmacologic Agent - OS - Left Eye       Time Out 05/09/2019. 11:02 AM. Confirmed correct patient, procedure, site, and patient consented.   Anesthesia Topical anesthesia was used. Anesthetic medications included Lidocaine 2%.   Procedure Preparation included 5% betadine to ocular surface, eyelid speculum. A supplied needle was used.   Injection:  1.25 mg Bevacizumab (AVASTIN) SOLN   NDC: 70360-001-02, Lot: [id number] , Expiration date: 05/30/2019   Route: Intravitreal, Site: Left Eye, Waste: 0 mg  Post-op Post injection exam found visual acuity of at least counting  fingers. The  patient tolerated the procedure well. There were no complications. The patient received written and verbal post procedure care education.                 ASSESSMENT/PLAN:    ICD-10-CM   1. 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 Retinal edema  H35.81 OCT, Retina - OU - Both Eyes  3. Vitreous hemorrhage of right eye (HCC)  H43.11   4. Essential hypertension  I10   5. Hypertensive retinopathy of both eyes  H35.033   6. Combined forms of age-related cataract of both eyes  H25.813     1,2. Proliferative diabetic retinopathy w/o DME, OU  - formerly pt of Dr. Robin Searing at [facility]  - s/p PRP OU 11.15.19 -- has not been seen since then  - pt presented 6.24.20 with progressive vision loss OD since last visit with Dr. Robin Searing in 2019  - OD: diffuse VH  - OS: scattered flat NV  - OCT without diabetic macular edema, both eyes   - B-scan u/s 6.24.20 shows diffuse VH OD; no RT/RD or mass  - s/p PRP OS (06.24.20), fill in (07.07.20)  - s/p PRP OD fill in (08.06.20), fill-in (10.12.20)  - s/p IVA OD #1 (06.24.20), #2 (07.22.20), #3 (08.25.20), #4 (09.28.20), #5 (10.26.20), #6 (12.09.20), #7 (01.13.21)  - FA 2.3.21 shows focal blockage OD from residual VH improved, no NV; OS with persistent leaking NV  - today, BCVA 20/25 OU, slightly down from 20/20 at last visit  - VH clearing OD  - OS with new subhyaloid heme inf nasal periphery  - recommend IVA #8 OD today (02.24.21), #1 OS (02.24.21) for PDR  - consent obtained for OU today, 02.24.21             -  f/u 4 weeks, DFE, OCT, possible injection(s)   3. Vitreous Hemorrhage OD  - secondary to PDR as above  - s/p IVA OD #1 06.24.20, #2  (07.22.20),  #3 (08.25.20), #4 (09.28.20), #5 (10.26.20), #6 (12.09.20), #7 (01.13.21)  - s/p PRP OD fill in (08.06.20, 10.12.20)   - VH clearing and responding well to IVA  - recommend IVA OD #8 today, as above  - VH precautions reviewed -- minimize activities, keep head elevated, avoid ASA/NSAIDs/blood thinners as able   4,5. Hypertensive retinopathy OU  - discussed importance of tight BP control  - monitor  6. Mixed form age related cataract OU  - The symptoms of cataract, surgical options, and treatments and risks were discussed with patient.  - discussed diagnosis and progression  - not yet visually significant  - monitor for now   Ophthalmic Meds Ordered this visit:  Meds ordered this encounter  Medications  . Bevacizumab (AVASTIN) SOLN 1.25 mg  . Bevacizumab (AVASTIN) SOLN 1.25 mg       Return in about 4 weeks (around 06/06/2019) for f/u PDR OU, DFE, OCT.  There are no Patient Instructions on file for this visit.   Explained the diagnoses, plan, and follow up with the patient and they expressed understanding.  Patient expressed understanding of the importance of proper follow up care.    This document serves as a record of  services personally performed by Karie Chimera, MD, PhD. It was created on their behalf by Annalee Genta, COMT. The creation of this record is the provider's dictation and/or activities during the visit.  Electronically signed by: Annalee Genta, COMT 05/11/19 3:44 PM   Karie Chimera, M.D., Ph.D. Diseases & Surgery of the Retina and Vitreous [facility] 05/09/2019   I have reviewed the above documentation for accuracy and completeness, and I agree with the above. Karie Chimera, M.D., Ph.D. 05/11/19 3:44 PM   Abbreviations: M myopia (nearsighted); A astigmatism; H hyperopia (farsighted); P presbyopia; Mrx spectacle prescription;  CTL contact lenses; OD right eye; OS left eye; OU both eyes  XT exotropia; ET esotropia; PEK  punctate epithelial keratitis; PEE punctate epithelial erosions; DES dry eye syndrome; MGD meibomian gland dysfunction; ATs artificial tears; PFAT's preservative free artificial tears; NSC nuclear sclerotic cataract; PSC posterior subcapsular cataract; ERM epi-retinal membrane; PVD posterior vitreous detachment; RD retinal detachment; DM diabetes mellitus; DR diabetic retinopathy; NPDR non-proliferative diabetic retinopathy; PDR proliferative diabetic retinopathy; CSME clinically significant macular edema; DME diabetic macular edema; dbh dot blot hemorrhages; CWS cotton wool spot; POAG primary open angle glaucoma; C/D cup-to-disc ratio; HVF humphrey visual field; GVF goldmann visual field; OCT optical coherence tomography; IOP intraocular pressure; BRVO Branch retinal vein occlusion; CRVO central retinal vein occlusion; CRAO central retinal artery occlusion; BRAO branch retinal artery occlusion; RT retinal tear; SB scleral buckle; PPV pars plana vitrectomy; VH Vitreous hemorrhage; PRP panretinal laser photocoagulation; IVK intravitreal kenalog; VMT vitreomacular traction; MH Macular hole;  NVD neovascularization of the disc; NVE neovascularization elsewhere; AREDS age related eye disease study; ARMD age related macular degeneration; POAG primary open angle glaucoma; EBMD epithelial/anterior basement membrane dystrophy; ACIOL anterior chamber intraocular lens; IOL intraocular lens; PCIOL posterior chamber intraocular lens; Phaco/IOL phacoemulsification with intraocular lens placement; PRK photorefractive keratectomy; LASIK laser assisted in situ keratomileusis; HTN hypertension; DM diabetes mellitus; COPD chronic obstructive pulmonary disease

## 2019-05-09 ENCOUNTER — Ambulatory Visit (INDEPENDENT_AMBULATORY_CARE_PROVIDER_SITE_OTHER): Payer: Medicaid Other | Admitting: Ophthalmology

## 2019-05-09 ENCOUNTER — Encounter (INDEPENDENT_AMBULATORY_CARE_PROVIDER_SITE_OTHER): Payer: Self-pay | Admitting: Ophthalmology

## 2019-05-09 ENCOUNTER — Other Ambulatory Visit: Payer: Self-pay

## 2019-05-09 DIAGNOSIS — H25813 Combined forms of age-related cataract, bilateral: Secondary | ICD-10-CM

## 2019-05-09 DIAGNOSIS — H35033 Hypertensive retinopathy, bilateral: Secondary | ICD-10-CM

## 2019-05-09 DIAGNOSIS — I10 Essential (primary) hypertension: Secondary | ICD-10-CM

## 2019-05-09 DIAGNOSIS — E113593 Type 2 diabetes mellitus with proliferative diabetic retinopathy without macular edema, bilateral: Secondary | ICD-10-CM

## 2019-05-09 DIAGNOSIS — H3581 Retinal edema: Secondary | ICD-10-CM

## 2019-05-09 DIAGNOSIS — H4311 Vitreous hemorrhage, right eye: Secondary | ICD-10-CM | POA: Diagnosis not present

## 2019-05-11 ENCOUNTER — Encounter (INDEPENDENT_AMBULATORY_CARE_PROVIDER_SITE_OTHER): Payer: Self-pay | Admitting: Ophthalmology

## 2019-05-11 MED ORDER — BEVACIZUMAB CHEMO INJECTION 1.25MG/0.05ML SYRINGE FOR KALEIDOSCOPE
1.2500 mg | INTRAVITREAL | Status: AC | PRN
  Administered 2019-05-11: 14:00:00 1.25 mg via INTRAVITREAL

## 2019-05-11 MED ORDER — BEVACIZUMAB CHEMO INJECTION 1.25MG/0.05ML SYRINGE FOR KALEIDOSCOPE
1.2500 mg | INTRAVITREAL | Status: AC | PRN
  Administered 2019-05-11: 1.25 mg via INTRAVITREAL

## 2019-05-12 ENCOUNTER — Emergency Department (HOSPITAL_COMMUNITY)
Admission: EM | Admit: 2019-05-12 | Discharge: 2019-05-12 | Disposition: A | Discharge status: Home / Self Care | Payer: Medicaid Other | Attending: Emergency Medicine | Admitting: Emergency Medicine

## 2019-05-12 ENCOUNTER — Emergency Department (HOSPITAL_COMMUNITY): Payer: Medicaid Other

## 2019-05-12 ENCOUNTER — Encounter (HOSPITAL_COMMUNITY): Payer: Self-pay | Admitting: Emergency Medicine

## 2019-05-12 ENCOUNTER — Other Ambulatory Visit: Payer: Self-pay

## 2019-05-12 DIAGNOSIS — Y9301 Activity, walking, marching and hiking: Secondary | ICD-10-CM | POA: Diagnosis not present

## 2019-05-12 DIAGNOSIS — Z7982 Long term (current) use of aspirin: Secondary | ICD-10-CM | POA: Insufficient documentation

## 2019-05-12 DIAGNOSIS — M25561 Pain in right knee: Secondary | ICD-10-CM | POA: Diagnosis not present

## 2019-05-12 DIAGNOSIS — Z794 Long term (current) use of insulin: Secondary | ICD-10-CM | POA: Insufficient documentation

## 2019-05-12 DIAGNOSIS — S92354A Nondisplaced fracture of fifth metatarsal bone, right foot, initial encounter for closed fracture: Secondary | ICD-10-CM

## 2019-05-12 DIAGNOSIS — I10 Essential (primary) hypertension: Secondary | ICD-10-CM | POA: Insufficient documentation

## 2019-05-12 DIAGNOSIS — W109XXA Fall (on) (from) unspecified stairs and steps, initial encounter: Secondary | ICD-10-CM | POA: Diagnosis not present

## 2019-05-12 DIAGNOSIS — M25521 Pain in right elbow: Secondary | ICD-10-CM | POA: Diagnosis not present

## 2019-05-12 DIAGNOSIS — M25511 Pain in right shoulder: Secondary | ICD-10-CM

## 2019-05-12 DIAGNOSIS — Y92015 Private garage of single-family (private) house as the place of occurrence of the external cause: Secondary | ICD-10-CM | POA: Diagnosis not present

## 2019-05-12 DIAGNOSIS — S93401A Sprain of unspecified ligament of right ankle, initial encounter: Secondary | ICD-10-CM | POA: Diagnosis not present

## 2019-05-12 DIAGNOSIS — Y999 Unspecified external cause status: Secondary | ICD-10-CM | POA: Diagnosis not present

## 2019-05-12 DIAGNOSIS — E119 Type 2 diabetes mellitus without complications: Secondary | ICD-10-CM | POA: Insufficient documentation

## 2019-05-12 DIAGNOSIS — M542 Cervicalgia: Secondary | ICD-10-CM | POA: Diagnosis not present

## 2019-05-12 DIAGNOSIS — S80211A Abrasion, right knee, initial encounter: Secondary | ICD-10-CM | POA: Diagnosis not present

## 2019-05-12 DIAGNOSIS — T07XXXA Unspecified multiple injuries, initial encounter: Secondary | ICD-10-CM | POA: Diagnosis present

## 2019-05-12 DIAGNOSIS — Z79899 Other long term (current) drug therapy: Secondary | ICD-10-CM | POA: Insufficient documentation

## 2019-05-12 DIAGNOSIS — W19XXXA Unspecified fall, initial encounter: Secondary | ICD-10-CM

## 2019-05-12 MED ORDER — OXYCODONE-ACETAMINOPHEN 5-325 MG PO TABS
1.0000 | ORAL_TABLET | Freq: Once | ORAL | Status: AC
  Administered 2019-05-12: 1 via ORAL
  Filled 2019-05-12: qty 1

## 2019-05-12 MED ORDER — OXYCODONE-ACETAMINOPHEN 5-325 MG PO TABS: 1.0000 | ORAL_TABLET | ORAL | 0 refills | Status: DC | PRN

## 2019-05-12 NOTE — ED Triage Notes (Signed)
Patient reports she slipped in the rain last night and fell onto some cement in her husbands garage. She denies hitting her head or LOC. C/o pain to R arm/hand and R knee. Pt a/ox4, resp e/u, nad.

## 2019-05-12 NOTE — ED Notes (Signed)
Pt transported to XR.  

## 2019-05-12 NOTE — ED Notes (Signed)
Pt transported to CT ?

## 2019-05-12 NOTE — Discharge Instructions (Addendum)
Take Percocet as needed for severe pain Take Ibuprofen or Tylenol for mild-moderate pain Ice the ankle and foot to help reduce swelling and elevate the leg Wear the boot when out of bed Use heat on the shoulder to help with muscle pain Please follow up with your doctor

## 2019-05-12 NOTE — ED Notes (Signed)
Ortho tech paged to apply cam walker. 

## 2019-05-12 NOTE — ED Provider Notes (Signed)
[facility] EMERGENCY DEPARTMENT Provider Note   CSN: [id number] Arrival date & time: 05/12/19  1021     History Chief Complaint  Patient presents with  . Fall    Anna Wagner is a 59 y.o. female with history of diabetes, diabetic retinopathy, peripheral neuropathy who presents with a fall.  Patient states that when she was going out into her husband's garage last night and going down steps and her right ankle twisted and fell onto her right side.  She denies head injury or loss of consciousness.  She was able to get up and has been ambulating on the right side.  She had worsening pain today and states that her whole right side is "jacked up".  She reports neck pain which radiates down her right arm with numbness and tingling.  She also has this chronically due to neuropathy and is unsure if it is changed.  She is not have any arm weakness.  She also has right shoulder pain, right elbow pain, right hip pain, right knee pain, right ankle and foot pain.  Her right ankle and foot are swollen and bruised.  She is not on blood thinners. She took ASA for pain without relief.  HPI     Past Medical History:  Diagnosis Date  . Bell's palsy 2005  . Cataract   . Diabetes mellitus without complication (HCC)   . Diabetic retinopathy (HCC)    PDR OU  . Hypertension   . Hypertensive retinopathy    OU  . Neuromuscular disorder (HCC)    NEUROPATHY  . Neuropathy   . Sepsis (HCC) 02/11/2016    Patient Active Problem List   Diagnosis Date Noted  . Overactive bladder 06/22/2017  . Sepsis (HCC) 02/10/2016  . Diabetic ketoacidosis without coma associated with type 2 diabetes mellitus (HCC)   . Lactic acidosis   . Urinary tract infection without hematuria   . Essential hypertension   . 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 Hypokalemia 03/15/2014  . Foot abscess, left 03/15/2014  . Wound infection 03/14/2014  . Diabetic foot ulcer (HCC) 03/14/2014  . DM  type 2 (diabetes mellitus, type 2) (HCC) 03/14/2014  . Cellulitis of left foot 03/14/2014  . Bell's palsy   . VAGINITIS NOS 09/30/2006  . Depression, major, single episode, mild (HCC) 08/05/2006  . Diabetic neuropathy (HCC) 08/05/2006  . HYPERTENSION 08/05/2006  . Insomnia 08/05/2006  . BELL'S PALSY, HX OF 08/05/2006  . HYSTERECTOMY, HX OF 08/05/2006  . CHOLECYSTECTOMY, HX OF 08/05/2006    Past Surgical History:  Procedure Laterality Date  . AMPUTATION Left 03/15/2014   Procedure: AMPUTATION TRANSMETATARSAL;  Surgeon: Nadara Mustard, MD;  Location: [facility] OR;  Service: Orthopedics;  Laterality: Left;  . CESAREAN SECTION    . CHOLECYSTECTOMY       OB History   No obstetric history on file.     Family History  Problem Relation Age of Onset  . Heart attack Mother   . Diabetes Father   . Diabetes Sister     Social History   Tobacco Use  . Smoking status: Current Every Day Smoker    Packs/day: 1.00    Years: 38.00    Pack years: 38.00    Types: Cigarettes    Last attempt to quit: 09/19/2013    Years since quitting: 5.6  . Smokeless tobacco: Never Used  Substance Use Topics  . Alcohol use: No  . Drug use: Yes  Types: Marijuana    Home Medications Prior to Admission medications   Medication Sig Start Date End Date Taking? Authorizing Provider  acetaminophen (TYLENOL) 500 MG tablet Take 1,000 mg by mouth every 6 (six) hours as needed for fever.    [provider]  amitriptyline (ELAVIL) 100 MG tablet Take 1 tablet (100 mg total) by mouth at bedtime. 05/01/18   Hoy Register, MD  aspirin EC 81 MG tablet Take 81 mg by mouth at bedtime.     [provider]  atorvastatin (LIPITOR) 40 MG tablet Take 1 tablet (40 mg total) by mouth daily. 05/01/18   Hoy Register, MD  cephALEXin (KEFLEX) 500 MG capsule Take 1 capsule (500 mg total) by mouth 2 (two) times daily. 08/24/17   Hoy Register, MD  cetirizine (ZYRTEC) 10 MG tablet Take 1 tablet (10 mg total) by mouth  daily. 04/13/17   Hoy Register, MD  clotrimazole (LOTRIMIN) 1 % cream Apply 1 application topically 2 (two) times daily. 06/22/17   Hoy Register, MD  Cyanocobalamin (VITAMIN B-12 PO) Take 1 tablet by mouth 2 (two) times daily.    [provider]  DULoxetine (CYMBALTA) 60 MG capsule Take 1 capsule (60 mg total) by mouth daily. 05/01/18   Hoy Register, MD  furosemide (LASIX) 20 MG tablet TAKE 1 TABLET (20 MG TOTAL) BY MOUTH DAILY. 01/18/18   Hoy Register, MD  guaiFENesin (MUCINEX) 600 MG 12 hr tablet Take 1 tablet (600 mg total) by mouth 2 (two) times daily. 12/09/16   Hoy Register, MD  insulin NPH-regular Human (HUMULIN 70/30) (70-30) 100 UNIT/ML injection Inject subcutaneously twice daily 55 units before breakfast and 60 units dinner 05/01/18   Hoy Register, MD  Insulin Syringe-Needle U-100 (TRUEPLUS INSULIN SYRINGE) 30G X 5/16" 1 ML MISC Use as directed 04/13/17   Hoy Register, MD  lisinopril (PRINIVIL,ZESTRIL) 20 MG tablet Take 1 tablet (20 mg total) by mouth daily. 05/01/18   Newlin, Odette Horns, MD  LOTEMAX 0.5 % GEL APPLY 1 DROP TO AFFECTED EYE 4 TIMES A DAY FOR 7 DAYS 09/07/18   [provider]  LYRICA 100 MG capsule Take 1 capsule by mouth 3 times a day as directed by physician. 03/23/19   Hoy Register, MD  metFORMIN (GLUCOPHAGE) 1000 MG tablet TAKE 1 TABLET BY MOUTH TWICE DAILY WITH A MEAL 05/01/18   Hoy Register, MD  metoprolol tartrate (LOPRESSOR) 50 MG tablet Take 1 tablet (50 mg total) by mouth 2 (two) times daily. 05/01/18   Hoy Register, MD  oxybutynin (DITROPAN XL) 10 MG 24 hr tablet Take 1 tablet (10 mg total) by mouth at bedtime. 05/01/18   Hoy Register, MD  silver sulfADIAZINE (SILVADENE) 1 % cream Apply 1 application topically 2 (two) times daily. 08/24/17   Hoy Register, MD  traMADol (ULTRAM) 50 MG tablet Take 1 tablet (50 mg total) by mouth at bedtime. 05/01/18   Hoy Register, MD    Allergies    Patient has no known allergies.  Review of  Systems   Review of Systems  Respiratory: Negative for shortness of breath.   Cardiovascular: Negative for chest pain.  Gastrointestinal: Negative for abdominal pain.  Musculoskeletal: Positive for arthralgias, back pain, joint swelling, myalgias and neck pain.  Skin: Positive for wound.  Neurological: Negative for headaches.  All other systems reviewed and are negative.   Physical Exam Updated Vital Signs BP (!) 163/84 (BP Location: Left Arm)   Pulse 76   Temp (!) 97.5 F (36.4 C) (Oral)  Resp 18   SpO2 98%   Physical Exam Vitals and nursing note reviewed.  Constitutional:      General: She is not in acute distress.    Appearance: Normal appearance. She is well-developed. She is not ill-appearing.     Comments: Cooperative. In pain. NAD  HENT:     Head: Normocephalic and atraumatic.  Eyes:     General: No scleral icterus.       Right eye: No discharge.        Left eye: No discharge.     Conjunctiva/sclera: Conjunctivae normal.     Pupils: Pupils are equal, round, and reactive to light.  Neck:     Comments: Lower C-spine tenderness Cardiovascular:     Rate and Rhythm: Normal rate and regular rhythm.  Pulmonary:     Effort: Pulmonary effort is normal. No respiratory distress.     Breath sounds: Normal breath sounds.  Abdominal:     General: There is no distension.  Musculoskeletal:     Cervical back: Normal range of motion.     Comments: Right shoulder: Pt is guarding the right shoulder and not wanting to range. No acute deformity or swelling noted. There is tenderness of the shoulder and elbow. No forearm, wrist, or hand tenderness. 2+ radial pulse  Right hip: Pt reports pain but there is no tenderness elicited. There is diffuse anterior knee pain with associated abrasion. Mild proximal calf tenderness. There is diffuse ankle and foot swelling and ecchymosis over the top of the foot. 2+ DP pulse. Pt reports decreased sensation due to neuropathy  Left foot: s/p  transmetatarsal amputation.  Skin:    General: Skin is warm and dry.  Neurological:     Mental Status: She is alert and oriented to person, place, and time.  Psychiatric:        Behavior: Behavior normal.     ED Results / Procedures / Treatments   Labs (all labs ordered are listed, but only abnormal results are displayed) Labs Reviewed - No data to display  EKG None  Radiology DG Cervical Spine Complete  Result Date: 05/12/2019 CLINICAL DATA:  Slipped in the rain and fell onto cement. EXAM: CERVICAL SPINE - COMPLETE 4+ VIEW COMPARISON:  None FINDINGS: There is prevertebral soft tissue thickening suggested on lateral view. Lateral views that are submitted are limited secondary to overlap of shoulders, perhaps related to shoulder discomfort that is reported in the patient history. Signs of spinal degenerative change in the midcervical spine. This more focal prevertebral soft tissue thickening is of uncertain significance. Suggestion of mild irregularity of C6 though this could be degenerative. Degenerative changes are greatest in the mid to lower cervical spine at C4-5, C5-6 and C6-7. IMPRESSION: Limited assessment on lateral view, question some irregularity of C6 and more focal prevertebral soft tissue swelling. Consider CT of the cervical spine for further assessment. Degenerative changes throughout the spine. Electronically Signed   By: Donzetta Kohut M.D.   On: 05/12/2019 11:44   DG Shoulder Right  Result Date: 05/12/2019 CLINICAL DATA:  Fall.  Shoulder injury. EXAM: RIGHT SHOULDER - 2+ VIEW COMPARISON:  None. FINDINGS: There is no evidence of fracture or dislocation. There is no evidence of arthropathy or other focal bone abnormality. Soft tissues are unremarkable. IMPRESSION: Negative. Electronically Signed   By: Kennith Center M.D.   On: 05/12/2019 11:43   DG Elbow Complete Right  Result Date: 05/12/2019 CLINICAL DATA:  Fall.  Injury. EXAM: RIGHT ELBOW - COMPLETE 3+  VIEW COMPARISON:   None. FINDINGS: Probable old avulsion injury at the medial condyle of the distal humerus. No acute fracture. No subluxation or dislocation. No joint effusion. IMPRESSION: Negative. Electronically Signed   By: Misty Stanley M.D.   On: 05/12/2019 11:47   DG Ankle Complete Right  Result Date: 05/12/2019 CLINICAL DATA:  59 year old female with a history of shoulder injury EXAM: RIGHT ANKLE - COMPLETE 3+ VIEW COMPARISON:  None. FINDINGS: Fracture at the base of the fifth metatarsal, incompletely imaged on the current. Irregularity at the distal fibula with no comparison. Soft tissue swelling on the lateral ankle. Ankle mortise is congruent. No acute fracture of the tibia. Degenerative changes of the hindfoot including enthesopathic changes at the Achilles insertion and plantar fascia insertion. Atherosclerosis IMPRESSION: Acute fracture at the base of the fifth metatarsal, with associated soft tissue swelling. Irregularity at the distal fibula, may represent chronic changes versus tiny avulsion fracture. Degenerative changes. Electronically Signed   By: Corrie Mckusick D.O.   On: 05/12/2019 11:51   CT Cervical Spine Wo Contrast  Result Date: 05/12/2019 CLINICAL DATA:  Fall. Plain film demonstrating possible C6 irregularity and prevertebral soft tissue swelling. EXAM: CT CERVICAL SPINE WITHOUT CONTRAST TECHNIQUE: Multidetector CT imaging of the cervical spine was performed without intravenous contrast. Multiplanar CT image reconstructions were also generated. COMPARISON:  Plain film of earlier today. FINDINGS: Alignment: Spinal visualization through the top of T2. Maintenance of vertebral body height and alignment. Skull base and vertebrae: Coronal reformats demonstrate a normal C1-C2 articulation. No acute fracture. Significant facet arthropathy on the right at C2 through C5. Soft tissues and spinal canal: Bilateral carotid atherosclerosis. Normal prevertebral soft tissues. Disc levels:  Loss of intervertebral disc  height at C4-5 and C7-T1. Upper chest: Tiny nodules in both lung apices. Examples including on 86 and 87 of series 4. No apical pneumothorax. Other: None. IMPRESSION: 1. Cervical spondylosis, without acute osseous abnormality. 2. Tiny lung nodules at both apices.  Consider dedicated chest CT. Electronically Signed   By: Abigail Miyamoto M.D.   On: 05/12/2019 13:03   DG Knee Complete 4 Views Right  Result Date: 05/12/2019 CLINICAL DATA:  59 year old female with a history of fall and pain EXAM: RIGHT KNEE - COMPLETE 4+ VIEW COMPARISON:  None. FINDINGS: No acute displaced fracture. Mild joint space narrowing of the mediolateral compartments. Degenerative changes of the patellofemoral joint. No joint effusion. Atherosclerotic calcifications. IMPRESSION: Negative for acute bony abnormality. Tricompartmental osteoarthritis. Atherosclerosis Electronically Signed   By: Corrie Mckusick D.O.   On: 05/12/2019 11:46   DG Foot Complete Right  Result Date: 05/12/2019 CLINICAL DATA:  59 year old female with a fall EXAM: RIGHT FOOT COMPLETE - 3+ VIEW COMPARISON:  None. FINDINGS: Acute fracture at the base of the fifth metatarsal with associated soft tissue swelling. Degenerative changes of the hindfoot, including enthesopathic changes at the Achilles insertion and the plantar fascia insertion. Mild degenerative changes of the interphalangeal joints. IMPRESSION: Acute fracture at the base of the fifth metatarsal. Associated soft tissue swelling Electronically Signed   By: Corrie Mckusick D.O.   On: 05/12/2019 11:53   Intravitreal Injection, Pharmacologic Agent - OD - Right Eye  Result Date: 05/11/2019 Time Out 05/09/2019. 9:47 AM. Confirmed correct patient, procedure, site, and patient consented. Anesthesia Topical anesthesia was used. Anesthetic medications included Lidocaine 2%, Proparacaine 0.5%. Procedure Preparation included 5% betadine to ocular surface, eyelid speculum. A supplied needle was used. Injection: 1.25 mg  Bevacizumab (AVASTIN) SOLN   NDC: 29528-413-24, Lot: [id number] , Expiration  date: 05/30/2019   Route: Intravitreal, Site: Right Eye, Waste: 0 mL Post-op Post injection exam found visual acuity of at least counting fingers. The patient tolerated the procedure well. There were no complications. The patient received written and verbal post procedure care education.   DG Hip Unilat W or Wo Pelvis 2-3 Views Right  Result Date: 05/12/2019 CLINICAL DATA:  Slipped in the rain last night and fell onto cement. EXAM: DG HIP (WITH OR WITHOUT PELVIS) 2-3V RIGHT COMPARISON:  October of 2018 FINDINGS: There is no evidence of hip fracture or dislocation. Mild to moderate degenerative changes in the hips greatest on the left. Signs of vascular disease with calcified superficial femoral artery noted in the upper right thigh. Spinal degenerative changes are noted incidentally. IMPRESSION: No acute fracture or dislocation. Degenerative changes as above. Electronically Signed   By: Donzetta Kohut M.D.   On: 05/12/2019 11:50    Procedures Procedures (including critical care time)  Medications Ordered in ED Medications  oxyCODONE-acetaminophen (PERCOCET/ROXICET) 5-325 MG per tablet 1 tablet (1 tablet Oral Given 05/12/19 1053)    ED Course  I have reviewed the triage vital signs and the nursing notes.  Pertinent labs & imaging results that were available during my care of the patient were reviewed by me and considered in my medical decision making (see chart for details).  Clinical Course as of May 12 1427  Sat May 12, 2019  2119 59 year old female here for evaluation of injuries after a fall last night.  Complaining of pain right upper extremity and right lower extremity getting x-rays and CT imaging.  Likely discharge if no significant findings.   [MB]    Clinical Course User Index [MB] Terrilee Files, MD   59 year old female presents with diffuse right-sided pain after a mechanical fall last night.  She has  obvious swelling of the foot and ankle but reports pain diffusely in the neck, right shoulder and elbow, right hip and knee as well.  Distal pulses are intact.  Patient has chronic paresthesias related to her neuropathy.  Will obtain imaging and provide pain control.  X-ray of the C-spine shows possible irregularity at C6.  Will order CT C-spine.  X-ray of the foot shows fifth metatarsal fracture at the base.  Otherwise imaging is negative.  CT C-spine is negative.  Discussed results with patient.  Will place in a sling and cam walker.  Prescription for pain medicine was sent in and she was advised to follow-up with her doctor.  MDM Rules/Calculators/A&P                       Final Clinical Impression(s) / ED Diagnoses Final diagnoses:  Fall, initial encounter  Neck pain on right side  Acute pain of right shoulder  Right elbow pain  Acute pain of right knee  Closed nondisplaced fracture of fifth metatarsal bone of right foot, initial encounter  Sprain of right ankle, unspecified ligament, initial encounter    Rx / DC Orders ED Discharge Orders    None       Bethel Born, PA-C 05/12/19 1432    Terrilee Files, MD 05/12/19 1659

## 2019-05-12 NOTE — Progress Notes (Signed)
Orthopedic Tech Progress Note Patient Details:  Anna Wagner [birth year] [id number] I applied the CAM WALKER and the RN applied the ARM SLING  Ortho Devices Type of Ortho Device: CAM walker Ortho Device/Splint Location: RLE Ortho Device/Splint Interventions: Ordered, Application   Post Interventions Patient Tolerated: Well Instructions Provided: Care of device, Adjustment of device   Donald Pore 05/12/2019, 1:32 PM

## 2019-06-06 ENCOUNTER — Encounter (INDEPENDENT_AMBULATORY_CARE_PROVIDER_SITE_OTHER): Payer: Medicaid Other | Admitting: Ophthalmology

## 2019-06-18 NOTE — Progress Notes (Signed)
[facility] - Clinic Note  06/20/2019     CHIEF COMPLAINT Patient presents for Retina Follow Up   HISTORY OF PRESENT ILLNESS: Anna MoccasinMarsha R Noguez is a 59 y.o. female who presents to the clinic today for:   HPI    Retina Follow Up    Patient presents with  Diabetic Retinopathy.  In both eyes.  Severity is moderate.  Duration of 4 weeks.  Since onset it is stable.  I, the attending physician,  performed the HPI with the patient and updated documentation appropriately.          Comments    Patient states vision the same OU.        Last edited by Rennis ChrisZamora, Giah Fickett, MD on 06/20/2019  2:53 PM. (History)    Patient states she had a "horrible Easter" bc she felt like she was "going blind", she states her left eye is always blurry and she has been seeing floaters, although she has not seen any today, she states her blood sugar and blood pressure have been good, she states she just had her 3 month physical last week, she is schedule to have her right eye cataract removed on Friday   Referring physician: Hoy RegisterNewlin, Enobong, MD [address] [place],  [place] [id number]  HISTORICAL INFORMATION:   Selected notes from the MEDICAL RECORD NUMBER Referred by [facility] Former pt of Dr. Scharlene CornNicholas Marchase LEE: 11.15.19 (N. Marchase) [BCVA: OD: 20/60 OS: 20/30] Ocular Hx-NPDR OS, PDR OD, vitreous heme, cataracts OU (pt received IVL OU and PRP OU on 11.15.19)  PMH-DM (A1c: 10.1 05.01.19, takes Humulin and metformin), arthritis, depression, HTN, HLD, neuropathy   CURRENT MEDICATIONS: Current Outpatient Medications (Ophthalmic Drugs)  Medication Sig  . LOTEMAX 0.5 % GEL APPLY 1 DROP TO AFFECTED EYE 4 TIMES A DAY FOR 7 DAYS   No current facility-administered medications for this visit. (Ophthalmic Drugs)   Current Outpatient Medications (Other)  Medication Sig  . acetaminophen (TYLENOL) 500 MG tablet Take 1,000 mg by mouth every 6 (six) hours as needed for fever.  Marland Kitchen.  amitriptyline (ELAVIL) 100 MG tablet Take 1 tablet (100 mg total) by mouth at bedtime.  Marland Kitchen. aspirin EC 81 MG tablet Take 81 mg by mouth at bedtime.   Marland Kitchen. atorvastatin (LIPITOR) 40 MG tablet Take 1 tablet (40 mg total) by mouth daily.  . cephALEXin (KEFLEX) 500 MG capsule Take 1 capsule (500 mg total) by mouth 2 (two) times daily.  . cetirizine (ZYRTEC) 10 MG tablet Take 1 tablet (10 mg total) by mouth daily.  . clotrimazole (LOTRIMIN) 1 % cream Apply 1 application topically 2 (two) times daily.  . Cyanocobalamin (VITAMIN B-12 PO) Take 1 tablet by mouth 2 (two) times daily.  . DULoxetine (CYMBALTA) 60 MG capsule Take 1 capsule (60 mg total) by mouth daily.  . furosemide (LASIX) 20 MG tablet TAKE 1 TABLET (20 MG TOTAL) BY MOUTH DAILY.  Marland Kitchen. guaiFENesin (MUCINEX) 600 MG 12 hr tablet Take 1 tablet (600 mg total) by mouth 2 (two) times daily.  . insulin NPH-regular Human (HUMULIN 70/30) (70-30) 100 UNIT/ML injection Inject subcutaneously twice daily 55 units before breakfast and 60 units dinner  . Insulin Syringe-Needle U-100 (TRUEPLUS INSULIN SYRINGE) 30G X 5/16" 1 ML MISC Use as directed  . lisinopril (PRINIVIL,ZESTRIL) 20 MG tablet Take 1 tablet (20 mg total) by mouth daily.  Marland Kitchen. LYRICA 100 MG capsule Take 1 capsule by mouth 3 times a day as directed by  physician.  . metFORMIN (GLUCOPHAGE) 1000 MG tablet TAKE 1 TABLET BY MOUTH TWICE DAILY WITH A MEAL  . metoprolol tartrate (LOPRESSOR) 50 MG tablet Take 1 tablet (50 mg total) by mouth 2 (two) times daily.  Marland Kitchen oxybutynin (DITROPAN XL) 10 MG 24 hr tablet Take 1 tablet (10 mg total) by mouth at bedtime.  Marland Kitchen oxyCODONE-acetaminophen (PERCOCET/ROXICET) 5-325 MG tablet Take 1 tablet by mouth every 4 (four) hours as needed for severe pain.  . silver sulfADIAZINE (SILVADENE) 1 % cream Apply 1 application topically 2 (two) times daily.  . traMADol (ULTRAM) 50 MG tablet Take 1 tablet (50 mg total) by mouth at bedtime.   No current facility-administered medications for  this visit. (Other)      REVIEW OF SYSTEMS: ROS    Positive for: Endocrine, Eyes   Negative for: Constitutional, Gastrointestinal, Neurological, Skin, Genitourinary, Musculoskeletal, HENT, Cardiovascular, Respiratory, Psychiatric, Allergic/Imm, Heme/Lymph   Last edited by Annalee Genta D, COT on 06/20/2019  2:07 PM. (History)       ALLERGIES No Known Allergies  PAST MEDICAL HISTORY Past Medical History:  Diagnosis Date  . Bell's palsy 2005  . Cataract   . Diabetes mellitus without complication (HCC)   . Diabetic retinopathy (HCC)    PDR OU  . Hypertension   . Hypertensive retinopathy    OU  . Neuromuscular disorder (HCC)    NEUROPATHY  . Neuropathy   . Sepsis (HCC) 02/11/2016   Past Surgical History:  Procedure Laterality Date  . AMPUTATION Left 03/15/2014   Procedure: AMPUTATION TRANSMETATARSAL;  Surgeon: Nadara Mustard, MD;  [facility];  Service: Orthopedics;  Laterality: Left;  . CESAREAN SECTION    . CHOLECYSTECTOMY      FAMILY HISTORY Family History  Problem Relation Age of Onset  . Heart attack Mother   . Diabetes Father   . Diabetes Sister     SOCIAL HISTORY Social History   Tobacco Use  . Smoking status: Current Every Day Smoker    Packs/day: 1.00    Years: 38.00    Pack years: 38.00    Types: Cigarettes    Last attempt to quit: 09/19/2013    Years since quitting: 5.7  . Smokeless tobacco: Never Used  Substance Use Topics  . Alcohol use: No  . Drug use: Yes    Types: Marijuana         OPHTHALMIC EXAM:  Base Eye Exam    Visual Acuity (Snellen - Linear)      Right Left   Dist [place] 20/30 -2 20/30   Dist ph [place] 20/25 -2 20/25 -1       Tonometry (Tonopen, 2:16 PM)      Right Left   Pressure 18 18       Pupils      Dark Light Shape React APD   Right 3 2 Round Brisk None   Left 3 2 Round Brisk None       Visual Fields (Counting fingers)      Left Right    Full Full       Extraocular Movement      Right Left    Full, Ortho  Full, Ortho       Neuro/Psych    Oriented x3: Yes   Mood/Affect: Normal       Dilation    Both eyes: 1.0% Mydriacyl, 2.5% Phenylephrine @ 2:16 PM        Slit Lamp and Fundus Exam    Slit Lamp  Exam      Right Left   Lids/Lashes Dermatochalasis - upper lid Dermatochalasis - upper lid   Conjunctiva/Sclera nasal and temporal Pinguecula nasal and temporal Pinguecula   Cornea 2+ inferior Punctate epithelial erosions 2-3+ inferior Punctate epithelial erosions   Anterior Chamber Deep and quiet Deep and quiet   Iris Round and moderately dilated, No NVI Round and dilated, No NVI   Lens 2-3+ Nuclear sclerosis, 2-3+ Cortical cataract 2-3+ Nuclear sclerosis, 2-3+ Cortical cataract   Vitreous Vitreous syneresis, +RBCs/pigment in anterior vitreous, old VH turning white and settling inferiorly, Posterior vitreous detachment Vitreous syneresis, +RBC in anterior vitreous, mild diffuse VH       Fundus Exam      Right Left   Disc Pink and Sharp, mild tilt Pink and Sharp, mild tilt   C/D Ratio 0.3 0.4   Macula Flat, Blunted foveal reflex, RPE mottling and clumping, Drusen Flat, blunted foveal relfex, Retinal pigment epithelial mottling, Drusen, No heme or edema   Vessels Attenuated, tortuous Vascular attenuation, Tortuous   Periphery Attached 360, inferior periphery obscured by old VH--white and improving, 360 PRP w/ room for posterior fill in if needed, scattered IRH, focal area of fibrosis inferior to disc with blood clot - clearing and improving Attached, good 360 PRP with good posterior fill in changes, sub hyaloid heme migrated inferiorly (6 midzone) -- boat shaped          IMAGING AND PROCEDURES  Imaging and Procedures for @TODAY @  OCT, Retina - OU - Both Eyes       Right Eye Quality was good. Central Foveal Thickness: 239. Progression has worsened. Findings include normal foveal contour, no IRF, no SRF, epiretinal membrane, vitreous traction, outer retinal atrophy (Persistent vitreous  opacities -- slightly increased).   Left Eye Quality was good. Central Foveal Thickness: 256. Progression has worsened. Findings include normal foveal contour, no IRF, no SRF, outer retinal atrophy, vitreomacular adhesion , retinal drusen  (Persistent vitreous opacities, slightly increased, subhyaloid heme migrated inferiorly from prior -- caught on widefield).   Notes *Images captured and stored on drive  Diagnosis / Impression:  OD: NFP; no IRF/SRF; +ERM; tractional edema inferior periphery -- improved; persistent vitreous opacities OS: NFP, no IRF/SRF; Persistent vitreous opacities, slightly increased, subhyaloid heme migrated inferiorly from prior -- caught on widefield  Clinical management:  See below  Abbreviations: NFP - Normal foveal profile. CME - cystoid macular edema. PED - pigment epithelial detachment. IRF - intraretinal fluid. SRF - subretinal fluid. EZ - ellipsoid zone. ERM - epiretinal membrane. ORA - outer retinal atrophy. ORT - outer retinal tubulation. SRHM - subretinal hyper-reflective material        Intravitreal Injection, Pharmacologic Agent - OD - Right Eye       Time Out 06/20/2019. 2:21 PM. Confirmed correct patient, procedure, site, and patient consented.   Anesthesia Topical anesthesia was used. Anesthetic medications included Proparacaine 0.5%, Lidocaine 2%.   Procedure Preparation included eyelid speculum, 5% betadine to ocular surface. A supplied needle was used.   Injection:  1.25 mg Bevacizumab (AVASTIN) SOLN   NDC: 08/20/2019, [id number], Expiration date: 09/27/2019   Route: Intravitreal, Site: Right Eye, Waste: 0 mL  Post-op Post injection exam found visual acuity of at least counting fingers. The patient tolerated the procedure well. There were no complications. The patient received written and verbal post procedure care education.        Intravitreal Injection, Pharmacologic Agent - OS - Left Eye  Time Out 06/20/2019.  2:33 PM. Confirmed correct patient, procedure, site, and patient consented.   Anesthesia Topical anesthesia was used. Anesthetic medications included Lidocaine 2%, Proparacaine 0.5%.   Procedure Preparation included 5% betadine to ocular surface, eyelid speculum. A (32g) needle was used.   Injection:  1.25 mg Bevacizumab (AVASTIN) SOLN   NDC: 34287-681-15, Lot: [id number], Expiration date: 08/15/2019   Route: Intravitreal, Site: Left Eye, Waste: 0 mL  Post-op Post injection exam found visual acuity of at least counting fingers. The patient tolerated the procedure well. There were no complications. The patient received written and verbal post procedure care education.                 ASSESSMENT/PLAN:    ICD-10-CM   1. 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 Retinal edema  H35.81 OCT, Retina - OU - Both Eyes  3. Vitreous hemorrhage of right eye (HCC)  H43.11   4. Essential hypertension  I10   5. Hypertensive retinopathy of both eyes  H35.033   6. Combined forms of age-related cataract of both eyes  H25.813     1,2. Proliferative diabetic retinopathy w/o DME, OU  - formerly pt of Dr. Robin Searing at [facility]  - s/p PRP OU 11.15.19 -- had not been seen since then  - pt presented here 6.24.20 with progressive vision loss OD since last visit with Dr. Robin Searing in 2019  - OD: diffuse VH  - OS: scattered flat NV  - OCT without diabetic macular edema, both eyes   - B-scan u/s 6.24.20 shows diffuse VH OD; no RT/RD or mass  - s/p PRP OS (06.24.20), fill in (07.07.20)  - s/p PRP OD fill in (08.06.20), fill-in (10.12.20)  - s/p IVA OD #1 (06.24.20), #2 (07.22.20), #3 (08.25.20), #4  (09.28.20), #5 (10.26.20), #6 (12.09.20), #7 (01.13.21), #8 (02.24.21)  - s/p IVA OS #1 (02.24.21) -- for new subhyaloid heme  - FA 2.3.21 shows focal blockage OD from residual VH improved, no NV; OS with persistent leaking NV  - today, BCVA stable 20/25 OU  - VH clearing OD  - OS subhyaloid migrated inferiorly -- boat shaped  - recommend IVA #9 OD today (04.07.21), #2 OS (04.07.21) for PDR  - consent obtained for OU,   - IVA OU consent form signed and scanned on 02.24.21             - f/u 4 weeks, DFE, OCT, possible injection(s)   3. Vitreous Hemorrhage OD  - secondary to PDR as above  - s/p IVA OD #1 06.24.20, #2 (07.22.20),  #3 (08.25.20), #4 (09.28.20), #5 (10.26.20), #6 (12.09.20), #7 (01.13.21), #8 (02.24.21)  - s/p PRP OD fill in (08.06.20, 10.12.20)   - VH clearing and responding well to IVA  - recommend IVA OD #9 today, as above  - VH precautions reviewed -- minimize activities, keep head elevated, avoid ASA/NSAIDs/blood thinners as able   4,5. Hypertensive retinopathy OU  - discussed importance of tight BP control  - monitor  6. Mixed form age related cataract OU  - The symptoms of cataract, surgical options, and treatments and risks were discussed with patient.  - discussed diagnosis and progression  - right eye  scheduled for Friday, 04.09.21 with Dr. Wyatt Portela   Ophthalmic Meds Ordered this visit:  Meds ordered this encounter  Medications  . Bevacizumab (AVASTIN) SOLN 1.25 mg  . Bevacizumab (AVASTIN) SOLN 1.25 mg       Return in about 4 weeks (around 07/18/2019) for f/u PDR OU, DFE, OCT.  There are no Patient Instructions on file for this visit.   Explained the diagnoses, plan, and follow up with the patient and they expressed understanding.  Patient expressed understanding of the importance of proper follow up care.   This document serves as a record of services personally performed by Gardiner Sleeper, MD, PhD. It was created on their behalf by Ernest Mallick,  OA, an ophthalmic assistant. The creation of this record is the provider's dictation and/or activities during the visit.    Electronically signed by: Ernest Mallick, OA 04.07.2021 10:25 PM  Gardiner Sleeper, M.D., Ph.D. Diseases [facility] Surgery of the [facility] and [place] 06/20/2019   I have reviewed the above documentation for accuracy and completeness, and I agree with the above. Gardiner Sleeper, M.D., Ph.D. 06/23/19 10:25 PM    Abbreviations: M myopia (nearsighted); A astigmatism; H hyperopia (farsighted); P presbyopia; Mrx spectacle prescription;  CTL contact lenses; OD right eye; OS left eye; OU both eyes  XT exotropia; ET esotropia; PEK punctate epithelial keratitis; PEE punctate epithelial erosions; DES dry eye syndrome; MGD meibomian gland dysfunction; ATs artificial tears; PFAT's preservative free artificial tears; [place] nuclear sclerotic cataract; PSC posterior subcapsular cataract; ERM epi-retinal membrane; PVD posterior vitreous detachment; RD retinal detachment; DM diabetes mellitus; DR diabetic retinopathy; NPDR non-proliferative diabetic retinopathy; PDR proliferative diabetic retinopathy; CSME clinically significant macular edema; DME diabetic macular edema; dbh dot blot hemorrhages; CWS cotton wool spot; POAG primary open angle glaucoma; C/D cup-to-disc ratio; HVF humphrey visual field; GVF goldmann visual field; OCT optical coherence tomography; IOP intraocular pressure; BRVO Branch retinal vein occlusion; CRVO central retinal vein occlusion; CRAO central retinal artery occlusion; BRAO branch retinal artery occlusion; RT retinal tear; SB scleral buckle; PPV pars plana vitrectomy; VH Vitreous hemorrhage; PRP panretinal laser photocoagulation; IVK intravitreal kenalog; VMT vitreomacular traction; MH Macular hole;  NVD neovascularization of the disc; NVE neovascularization elsewhere; AREDS age related eye disease study; ARMD age related macular degeneration; POAG  primary open angle glaucoma; EBMD epithelial/anterior basement membrane dystrophy; ACIOL anterior chamber intraocular lens; IOL intraocular lens; PCIOL posterior chamber intraocular lens; Phaco/IOL phacoemulsification with intraocular lens placement; [place] photorefractive keratectomy; LASIK laser assisted in situ keratomileusis; HTN hypertension; DM diabetes mellitus; COPD chronic obstructive pulmonary disease

## 2019-06-20 ENCOUNTER — Ambulatory Visit (INDEPENDENT_AMBULATORY_CARE_PROVIDER_SITE_OTHER): Payer: Medicaid Other | Admitting: Ophthalmology

## 2019-06-20 ENCOUNTER — Encounter (INDEPENDENT_AMBULATORY_CARE_PROVIDER_SITE_OTHER): Payer: Self-pay | Admitting: Ophthalmology

## 2019-06-20 DIAGNOSIS — H3581 Retinal edema: Secondary | ICD-10-CM | POA: Diagnosis not present

## 2019-06-20 DIAGNOSIS — H25813 Combined forms of age-related cataract, bilateral: Secondary | ICD-10-CM

## 2019-06-20 DIAGNOSIS — H4311 Vitreous hemorrhage, right eye: Secondary | ICD-10-CM | POA: Diagnosis not present

## 2019-06-20 DIAGNOSIS — I10 Essential (primary) hypertension: Secondary | ICD-10-CM

## 2019-06-20 DIAGNOSIS — H35033 Hypertensive retinopathy, bilateral: Secondary | ICD-10-CM

## 2019-06-20 DIAGNOSIS — E113593 Type 2 diabetes mellitus with proliferative diabetic retinopathy without macular edema, bilateral: Secondary | ICD-10-CM

## 2019-06-23 MED ORDER — BEVACIZUMAB CHEMO INJECTION 1.25MG/0.05ML SYRINGE FOR KALEIDOSCOPE
1.2500 mg | INTRAVITREAL | Status: AC | PRN
  Administered 2019-06-23: 22:00:00 1.25 mg via INTRAVITREAL

## 2019-06-23 MED ORDER — BEVACIZUMAB CHEMO INJECTION 1.25MG/0.05ML SYRINGE FOR KALEIDOSCOPE
1.2500 mg | INTRAVITREAL | Status: AC | PRN
  Administered 2019-06-23: 1.25 mg via INTRAVITREAL

## 2019-06-29 ENCOUNTER — Ambulatory Visit
Admission: RE | Admit: 2019-06-29 | Discharge: 2019-06-29 | Disposition: A | Discharge status: Home / Self Care | Payer: Medicaid Other | Source: Ambulatory Visit | Attending: Internal Medicine | Admitting: Internal Medicine

## 2019-06-29 ENCOUNTER — Other Ambulatory Visit: Payer: Self-pay | Admitting: Internal Medicine

## 2019-06-29 ENCOUNTER — Other Ambulatory Visit: Payer: Self-pay

## 2019-06-29 DIAGNOSIS — T148XXA Other injury of unspecified body region, initial encounter: Secondary | ICD-10-CM

## 2019-07-17 NOTE — Progress Notes (Signed)
[facility] Note  07/18/2019     CHIEF COMPLAINT Patient presents for Retina Follow Up   HISTORY OF PRESENT ILLNESS: Anna Wagner is a 59 y.o. female who presents to the clinic today for:   HPI    Retina Follow Up    Patient presents with  Diabetic Retinopathy.  In both eyes.  Severity is severe.  Duration of 4 weeks.  I, the attending physician,  performed the HPI with the patient and updated documentation appropriately.          Comments    4 week follow up- Eyes are tearing, unsure if it is from pollen causing vision to be blurry.  Pt did have cataract sx OD since last visit by Dr. Katy Fitch. She finished po gtts.  Only using Systane.  She will follow up with Dr. Katy Fitch tomorrow.        Last edited by [name], MD on 07/18/2019  9:36 AM. (History)    Patient states her eyes are bothering her, she thinks it's the pollen, pt recently fractured her foot and is in a boot, she has had cataract sx OD with Dr. Katy Fitch, she goes back to see him tomorrow to find out about the left eye  Referring physician: [name], MD [address],  [place] [address]  HISTORICAL INFORMATION:   Selected notes from the MEDICAL RECORD NUMBER Referred by [facility] Former pt of Dr. Satira Mccallum LEE: 11.15.19 (N. Marchase) [BCVA: OD: 20/60 OS: 20/30] Ocular Hx-NPDR OS, PDR OD, vitreous heme, cataracts OU (pt received IVL OU and PRP OU on 11.15.19)  PMH-DM (A1c: 10.1 05.01.19, takes Humulin and metformin), arthritis, depression, HTN, HLD, neuropathy   CURRENT MEDICATIONS: Current Outpatient Medications (Ophthalmic Drugs)  Medication Sig  . LOTEMAX 0.5 % GEL APPLY 1 DROP TO AFFECTED EYE 4 TIMES A DAY FOR 7 DAYS   No current facility-administered medications for this visit. (Ophthalmic Drugs)   Current Outpatient Medications (Other)  Medication Sig  . acetaminophen (TYLENOL) 500 MG tablet Take 1,000 mg by mouth every 6 (six) hours as needed for  fever.  Marland Kitchen amitriptyline (ELAVIL) 100 MG tablet Take 1 tablet (100 mg total) by mouth at bedtime.  Marland Kitchen aspirin EC 81 MG tablet Take 81 mg by mouth at bedtime.   Marland Kitchen atorvastatin (LIPITOR) 40 MG tablet Take 1 tablet (40 mg total) by mouth daily.  . cephALEXin (KEFLEX) 500 MG capsule Take 1 capsule (500 mg total) by mouth 2 (two) times daily.  . cetirizine (ZYRTEC) 10 MG tablet Take 1 tablet (10 mg total) by mouth daily.  . clotrimazole (LOTRIMIN) 1 % cream Apply 1 application topically 2 (two) times daily.  . Cyanocobalamin (VITAMIN B-12 PO) Take 1 tablet by mouth 2 (two) times daily.  . DULoxetine (CYMBALTA) 60 MG capsule Take 1 capsule (60 mg total) by mouth daily.  . furosemide (LASIX) 20 MG tablet TAKE 1 TABLET (20 MG TOTAL) BY MOUTH DAILY.  Marland Kitchen guaiFENesin (MUCINEX) 600 MG 12 hr tablet Take 1 tablet (600 mg total) by mouth 2 (two) times daily.  . insulin NPH-regular Human (HUMULIN 70/30) (70-30) 100 UNIT/ML injection Inject subcutaneously twice daily 55 units before breakfast and 60 units dinner  . Insulin Syringe-Needle U-100 (TRUEPLUS INSULIN SYRINGE) 30G X 5/16" 1 ML MISC Use as directed  . lisinopril (PRINIVIL,ZESTRIL) 20 MG tablet Take 1 tablet (20 mg total) by mouth daily.  Marland Kitchen LYRICA 100 MG capsule Take 1  capsule by mouth 3 times a day as directed by physician.  . metFORMIN (GLUCOPHAGE) 1000 MG tablet TAKE 1 TABLET BY MOUTH TWICE DAILY WITH A MEAL  . metoprolol tartrate (LOPRESSOR) 50 MG tablet Take 1 tablet (50 mg total) by mouth 2 (two) times daily.  Marland Kitchen. oxybutynin (DITROPAN XL) 10 MG 24 hr tablet Take 1 tablet (10 mg total) by mouth at bedtime.  Marland Kitchen. oxyCODONE-acetaminophen (PERCOCET/ROXICET) 5-325 MG tablet Take 1 tablet by mouth every 4 (four) hours as needed for severe pain.  . silver sulfADIAZINE (SILVADENE) 1 % cream Apply 1 application topically 2 (two) times daily.  . traMADol (ULTRAM) 50 MG tablet Take 1 tablet (50 mg total) by mouth at bedtime.   No current facility-administered  medications for this visit. (Other)      REVIEW OF SYSTEMS: ROS    Positive for: Endocrine, Eyes   Negative for: Constitutional, Gastrointestinal, Neurological, Skin, Genitourinary, Musculoskeletal, HENT, Cardiovascular, Respiratory, Psychiatric, Allergic/Imm, Heme/Lymph   Last edited by Joni ReiningHodges, Robin, COA on 07/18/2019  8:49 AM. (History)       ALLERGIES No Known Allergies  PAST MEDICAL HISTORY Past Medical History:  Diagnosis Date  . Bell's palsy 2005  . Cataract   . Diabetes mellitus without complication (HCC)   . Diabetic retinopathy (HCC)    PDR OU  . Hypertension   . Hypertensive retinopathy    OU  . Neuromuscular disorder (HCC)    NEUROPATHY  . Neuropathy   . Sepsis (HCC) 02/11/2016   Past Surgical History:  Procedure Laterality Date  . AMPUTATION Left 03/15/2014   Procedure: AMPUTATION TRANSMETATARSAL;  Surgeon: Nadara MustardMarcus Duda V, MD;  [facility];  Service: Orthopedics;  Laterality: Left;  . CESAREAN SECTION    . CHOLECYSTECTOMY      FAMILY HISTORY Family History  Problem Relation Age of Onset  . Heart attack Mother   . Diabetes Father   . Diabetes Sister     SOCIAL HISTORY Social History   Tobacco Use  . Smoking status: Current Every Day Smoker    Packs/day: 1.00    Years: 38.00    Pack years: 38.00    Types: Cigarettes    Last attempt to quit: 09/19/2013    Years since quitting: 5.8  . Smokeless tobacco: Never Used  Substance Use Topics  . Alcohol use: No  . Drug use: Yes    Types: Marijuana         OPHTHALMIC EXAM:  Base Eye Exam    Visual Acuity (Snellen - Linear)      Right Left   Dist [place] 20/20 -2 20/50 +1   Dist ph   20/40 -1       Tonometry (Tonopen, 8:58 AM)      Right Left   Pressure 17 16       Pupils      Dark Light Shape React APD   Right 3 2 Round Brisk None   Left 3 2 Round Brisk None       Visual Fields (Counting fingers)      Left Right    Full Full       Extraocular Movement      Right Left    Full  Full       Neuro/Psych    Oriented x3: Yes   Mood/Affect: Normal       Dilation    Both eyes: 1.0% Mydriacyl, 2.5% Phenylephrine @ 8:58 AM        Slit Lamp and  Fundus Exam    Slit Lamp Exam      Right Left   Lids/Lashes Dermatochalasis - upper lid Dermatochalasis - upper lid   Conjunctiva/Sclera nasal and temporal Pinguecula nasal and temporal Pinguecula   Cornea 1+ inferior Punctate epithelial erosions, well healed temporal cataract wounds 2-3+ inferior Punctate epithelial erosions   Anterior Chamber Deep and quiet Deep and quiet   Iris Round and moderately dilated, No NVI Round and dilated, No NVI   Lens PC IOL in good position, mild PC folds 2-3+ Nuclear sclerosis with early brunescence, 2-3+ Cortical cataract   Vitreous Vitreous syneresis, +RBCs/pigment in anterior vitreous, old VH turning white and settling inferiorly, Posterior vitreous detachment, old white blood clots settled inferiorly Vitreous syneresis, +RBC in anterior vitreous, mild diffuse VH       Fundus Exam      Right Left   Disc Pink and Sharp, mild tilt Pink and Sharp, mild tilt   C/D Ratio 0.3 0.3   Macula Flat, Blunted foveal reflex, RPE mottling and clumping, Drusen Flat, blunted foveal relfex, Retinal pigment epithelial mottling, Drusen, No heme or edema   Vessels Attenuated, tortuous Vascular attenuation, mild Tortuousity   Periphery Attached 360, inferior periphery obscured by old VH--white and improving, 360 PRP w/ room for posterior fill in if needed, scattered IRH, focal area of fibrosis inferior to disc with blood clot - clearing and improving Attached, good 360 PRP with good posterior fill in changes, sub hyaloid heme migrated inferiorly (6 midzone) -- boat shaped -- persistent        Refraction    Manifest Refraction      Sphere Cylinder Dist VA   Right      Left -1.75 Sphere 20/30          IMAGING AND PROCEDURES  Imaging and Procedures for @TODAY @  OCT, Retina - OU - Both Eyes        Right Eye Quality was good. Central Foveal Thickness: 247. Progression has been stable. Findings include normal foveal contour, no IRF, no SRF, epiretinal membrane, vitreous traction, outer retinal atrophy (Persistent vitreous opacities, ?slight improvement).   Left Eye Quality was good. Central Foveal Thickness: 254. Progression has improved. Findings include normal foveal contour, no IRF, no SRF, outer retinal atrophy, retinal drusen  (Persistent vitreous opacities, subhyaloid heme migrated inferiorly from prior -- caught on widefield -- slightly improved, interval release of VMA).   Notes *Images captured and stored on drive  Diagnosis / Impression:  OD: NFP; no IRF/SRF; +ERM; tractional edema inferior periphery -- improved; persistent vitreous opacities, ?slight improvement OS: NFP, no IRF/SRF; Persistent vitreous opacities, subhyaloid heme migrated inferiorly from prior -- caught on widefield -- slightly improved, interval release of VMA  Clinical management:  See below  Abbreviations: NFP - Normal foveal profile. CME - cystoid macular edema. PED - pigment epithelial detachment. IRF - intraretinal fluid. SRF - subretinal fluid. EZ - ellipsoid zone. ERM - epiretinal membrane. ORA - outer retinal atrophy. ORT - outer retinal tubulation. SRHM - subretinal hyper-reflective material        Intravitreal Injection, Pharmacologic Agent - OD - Right Eye       Time Out 07/18/2019. 9:31 AM. Confirmed correct patient, procedure, site, and patient consented.   Anesthesia Topical anesthesia was used. Anesthetic medications included Lidocaine 2%, Proparacaine 0.5%.   Procedure Preparation included 5% betadine to ocular surface, eyelid speculum. A (32g) needle was used.   Injection:  1.25 mg Bevacizumab (AVASTIN) SOLN   NDC: 09/17/2019, Lot:  [id number], Expiration date: 10/09/2019   Route: Intravitreal, Site: Right Eye, Waste: 0 mL  Post-op Post injection exam found visual acuity of at  least counting fingers. The patient tolerated the procedure well. There were no complications. The patient received written and verbal post procedure care education.        Intravitreal Injection, Pharmacologic Agent - OS - Left Eye       Time Out 07/18/2019. 9:43 AM. Confirmed correct patient, procedure, site, and patient consented.   Anesthesia Topical anesthesia was used. Anesthetic medications included Lidocaine 2%, Proparacaine 0.5%.   Procedure Preparation included 5% betadine to ocular surface, eyelid speculum. A (32g) needle was used.   Injection:  1.25 mg Bevacizumab (AVASTIN) SOLN   NDC: 30092-330-07, Lot: [id number] , Expiration date: 10/09/2019   Route: Intravitreal, Site: Left Eye, Waste: 0 mL  Post-op Post injection exam found visual acuity of at least counting fingers. The patient tolerated the procedure well. There were no complications. The patient received written and verbal post procedure care education.                 ASSESSMENT/PLAN:    ICD-10-CM   1. 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 Retinal edema  H35.81 OCT, Retina - OU - Both Eyes  3. Vitreous hemorrhage of right eye (HCC)  H43.11 Intravitreal Injection, Pharmacologic Agent - OD - Right Eye    Bevacizumab (AVASTIN) SOLN 1.25 mg  4. Essential hypertension  I10   5. Hypertensive retinopathy of both eyes  H35.033   6. Combined forms of age-related cataract of left eye  H25.812   7. Pseudophakia  Z96.1     1,2. Proliferative diabetic retinopathy w/o DME, OU  - formerly pt of Dr. 07-06-2001 at [facility]  - s/p PRP OU 11.15.19 -- had not been seen since then  - pt presented here 6.24.20 with  progressive vision loss OD since last visit with Dr. 08-29-2003 in 2019  - OD: diffuse VH  - OS: scattered flat NV  - OCT without diabetic macular edema, both eyes   - B-scan u/s 6.24.20 shows diffuse VH OD; no RT/RD or mass  - s/p PRP OS (06.24.20), fill in (07.07.20)  - s/p PRP OD fill in (08.06.20), fill-in (10.12.20)  - s/p IVA OD #1 (06.24.20), #2 (07.22.20), #3 (08.25.20), #4 (09.28.20), #5 (10.26.20), #6 (12.09.20), #7 (01.13.21), #8 (02.24.21), #9 (04.07.21)  - s/p IVA OS #1 (02.24.21) -- for new subhyaloid heme/PDR, #2 (04.07.21)  - FA (02.03.21) shows focal blockage OD from residual VH improved, no NV; OS with persistent leaking NV  - today, BCVA OD improved to 20/20 from 20/25; OS decreased to 20/40 from 20/25  - VH clearing OD  - OS subhyaloid migrated inferiorly -- boat shaped  - recommend IVA OU (OD #10; OS #3) today, 05.05.21  - informed consent obtained for OU  - IVA OU consent form signed and scanned on 02.24.21             - f/u 4 weeks, DFE, OCT, possible injection(s), next FA June/July   3. Vitreous Hemorrhage OD  - secondary to PDR as above  - s/p IVA OD #1 06.24.20, #2 (07.22.20),  #3 (08.25.20), #  4 (09.28.20), #5 (10.26.20), #6 (12.09.20), #7 (01.13.21), #8 (02.24.21)  - s/p PRP OD fill in (08.06.20, 10.12.20)   - VH clearing and responding well to IVA  - recommend IVA OD #10 today, as above  - VH precautions reviewed -- minimize activities, keep head elevated, avoid ASA/NSAIDs/blood thinners as able   4,5. Hypertensive retinopathy OU  - discussed importance of tight BP control  - monitor  6. 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  - under the expert care of Dr. Kathie Rhodes. Dione Booze  7. Pseudophakia OD  - s/p CE/IOL OD (4.9.21, Dr. Laruth Bouchard)  - beautiful surgery, doing well  - monitor    Ophthalmic Meds Ordered this visit:  Meds ordered this encounter  Medications  .  Bevacizumab (AVASTIN) SOLN 1.25 mg  . Bevacizumab (AVASTIN) SOLN 1.25 mg       Return in about 4 weeks (around 08/15/2019) for f/u PDR OU, DFE, OCT.  There are no Patient Instructions on file for this visit.   Explained the diagnoses, plan, and follow up with the patient and they expressed understanding.  Patient expressed understanding of the importance of proper follow up care.   This document serves as a record of services personally performed by Karie Chimera, MD, PhD. It was created on their behalf by Laurian Brim, OA, an ophthalmic assistant. The creation of this record is the provider's dictation and/or activities during the visit.    Electronically signed by: Laurian Brim, OA 05.05.2021 2:16 AM  Karie Chimera, M.D., Ph.D. Diseases & Surgery of the Retina and Vitreous [facility] 07/18/2019   I have reviewed the above documentation for accuracy and completeness, and I agree with the above. Karie Chimera, M.D., Ph.D. 07/21/19 2:16 AM   Abbreviations: M myopia (nearsighted); A astigmatism; H hyperopia (farsighted); P presbyopia; Mrx spectacle prescription;  CTL contact lenses; OD right eye; OS left eye; OU both eyes  XT exotropia; ET esotropia; PEK punctate epithelial keratitis; PEE punctate epithelial erosions; DES dry eye syndrome; MGD meibomian gland dysfunction; ATs artificial tears; PFAT's preservative free artificial tears; NSC nuclear sclerotic cataract; PSC posterior subcapsular cataract; ERM epi-retinal membrane; PVD posterior vitreous detachment; RD retinal detachment; DM diabetes mellitus; DR diabetic retinopathy; NPDR non-proliferative diabetic retinopathy; PDR proliferative diabetic retinopathy; CSME clinically significant macular edema; DME diabetic macular edema; dbh dot blot hemorrhages; CWS cotton wool spot; POAG primary open angle glaucoma; C/D cup-to-disc ratio; HVF humphrey visual field; GVF goldmann visual field; OCT optical coherence  tomography; IOP intraocular pressure; BRVO Branch retinal vein occlusion; CRVO central retinal vein occlusion; CRAO central retinal artery occlusion; BRAO branch retinal artery occlusion; RT retinal tear; SB scleral buckle; PPV pars plana vitrectomy; VH Vitreous hemorrhage; PRP panretinal laser photocoagulation; IVK intravitreal kenalog; VMT vitreomacular traction; MH Macular hole;  NVD neovascularization of the disc; NVE neovascularization elsewhere; AREDS age related eye disease study; ARMD age related macular degeneration; POAG primary open angle glaucoma; EBMD epithelial/anterior basement membrane dystrophy; ACIOL anterior chamber intraocular lens; IOL intraocular lens; PCIOL posterior chamber intraocular lens; Phaco/IOL phacoemulsification with intraocular lens placement; PRK photorefractive keratectomy; LASIK laser assisted in situ keratomileusis; HTN hypertension; DM diabetes mellitus; COPD chronic obstructive pulmonary disease

## 2019-07-18 ENCOUNTER — Other Ambulatory Visit: Payer: Self-pay

## 2019-07-18 ENCOUNTER — Ambulatory Visit (INDEPENDENT_AMBULATORY_CARE_PROVIDER_SITE_OTHER): Payer: Medicaid Other | Admitting: Ophthalmology

## 2019-07-18 ENCOUNTER — Encounter (INDEPENDENT_AMBULATORY_CARE_PROVIDER_SITE_OTHER): Payer: Self-pay | Admitting: Ophthalmology

## 2019-07-18 DIAGNOSIS — E113593 Type 2 diabetes mellitus with proliferative diabetic retinopathy without macular edema, bilateral: Secondary | ICD-10-CM | POA: Diagnosis not present

## 2019-07-18 DIAGNOSIS — H35033 Hypertensive retinopathy, bilateral: Secondary | ICD-10-CM

## 2019-07-18 DIAGNOSIS — H4311 Vitreous hemorrhage, right eye: Secondary | ICD-10-CM | POA: Diagnosis not present

## 2019-07-18 DIAGNOSIS — I10 Essential (primary) hypertension: Secondary | ICD-10-CM | POA: Diagnosis not present

## 2019-07-18 DIAGNOSIS — H25813 Combined forms of age-related cataract, bilateral: Secondary | ICD-10-CM

## 2019-07-18 DIAGNOSIS — H3581 Retinal edema: Secondary | ICD-10-CM

## 2019-07-18 DIAGNOSIS — H25812 Combined forms of age-related cataract, left eye: Secondary | ICD-10-CM

## 2019-07-18 DIAGNOSIS — Z961 Presence of intraocular lens: Secondary | ICD-10-CM

## 2019-07-21 MED ORDER — BEVACIZUMAB CHEMO INJECTION 1.25MG/0.05ML SYRINGE FOR KALEIDOSCOPE
1.2500 mg | INTRAVITREAL | Status: AC | PRN
  Administered 2019-07-21: 1.25 mg via INTRAVITREAL

## 2019-07-27 ENCOUNTER — Other Ambulatory Visit: Payer: Self-pay | Admitting: Family Medicine

## 2019-07-27 ENCOUNTER — Ambulatory Visit
Admission: RE | Admit: 2019-07-27 | Discharge: 2019-07-27 | Disposition: A | Discharge status: Home / Self Care | Payer: Medicaid Other | Source: Ambulatory Visit | Attending: Family Medicine | Admitting: Family Medicine

## 2019-07-27 DIAGNOSIS — Z09 Encounter for follow-up examination after completed treatment for conditions other than malignant neoplasm: Secondary | ICD-10-CM

## 2019-08-09 NOTE — Progress Notes (Signed)
[facility] - Clinic Note  08/15/2019     CHIEF COMPLAINT Patient presents for Retina Follow Up   HISTORY OF PRESENT ILLNESS: Anna Wagner is a 59 y.o. female who presents to the clinic today for:   HPI    Retina Follow Up    Patient presents with  Diabetic Retinopathy.  In both eyes.  This started 4 weeks ago.  Severity is moderate.  I, the attending physician,  performed the HPI with the patient and updated documentation appropriately.          Comments    Patient here for 4 weeks retina follow up for PDR OU. Patient states vision doing better that was last visit. Had cataract sx OD in April and scheduled this Friday to have cataract sx on OS. On steroid drops OU. BID OD now. Starts today for OS.       Last edited by Rennis Chris, MD on 08/15/2019  3:33 PM. (History)    Patient states   Referring physician: Hoy Register, MD [address] [place],  [place] [address]  HISTORICAL INFORMATION:   Selected notes from the MEDICAL RECORD NUMBER Referred by [facility] Former pt of Dr. Scharlene Corn LEE: 11.15.19 (N. Marchase) [BCVA: OD: 20/60 OS: 20/30] Ocular Hx-NPDR OS, PDR OD, vitreous heme, cataracts OU (pt received IVL OU and PRP OU on 11.15.19)  PMH-DM (A1c: 10.1 05.01.19, takes Humulin and metformin), arthritis, depression, HTN, HLD, neuropathy   CURRENT MEDICATIONS: Current Outpatient Medications (Ophthalmic Drugs)  Medication Sig   LOTEMAX 0.5 % GEL APPLY 1 DROP TO AFFECTED EYE 4 TIMES A DAY FOR 7 DAYS   No current facility-administered medications for this visit. (Ophthalmic Drugs)   Current Outpatient Medications (Other)  Medication Sig   acetaminophen (TYLENOL) 500 MG tablet Take 1,000 mg by mouth every 6 (six) hours as needed for fever.   amitriptyline (ELAVIL) 100 MG tablet Take 1 tablet (100 mg total) by mouth at bedtime.   aspirin EC 81 MG tablet Take 81 mg by mouth at bedtime.    atorvastatin (LIPITOR) 40 MG tablet  Take 1 tablet (40 mg total) by mouth daily.   cephALEXin (KEFLEX) 500 MG capsule Take 1 capsule (500 mg total) by mouth 2 (two) times daily.   cetirizine (ZYRTEC) 10 MG tablet Take 1 tablet (10 mg total) by mouth daily.   clotrimazole (LOTRIMIN) 1 % cream Apply 1 application topically 2 (two) times daily.   Cyanocobalamin (VITAMIN B-12 PO) Take 1 tablet by mouth 2 (two) times daily.   DULoxetine (CYMBALTA) 60 MG capsule Take 1 capsule (60 mg total) by mouth daily.   furosemide (LASIX) 20 MG tablet TAKE 1 TABLET (20 MG TOTAL) BY MOUTH DAILY.   guaiFENesin (MUCINEX) 600 MG 12 hr tablet Take 1 tablet (600 mg total) by mouth 2 (two) times daily.   insulin NPH-regular Human (HUMULIN 70/30) (70-30) 100 UNIT/ML injection Inject subcutaneously twice daily 55 units before breakfast and 60 units dinner   Insulin Syringe-Needle U-100 (TRUEPLUS INSULIN SYRINGE) 30G X 5/16" 1 ML MISC Use as directed   lisinopril (PRINIVIL,ZESTRIL) 20 MG tablet Take 1 tablet (20 mg total) by mouth daily.   LYRICA 100 MG capsule Take 1 capsule by mouth 3 times a day as directed by physician.   metFORMIN (GLUCOPHAGE) 1000 MG tablet TAKE 1 TABLET BY MOUTH TWICE DAILY WITH A MEAL   metoprolol tartrate (LOPRESSOR) 50 MG tablet Take 1 tablet (50 mg total) by  mouth 2 (two) times daily.   oxybutynin (DITROPAN XL) 10 MG 24 hr tablet Take 1 tablet (10 mg total) by mouth at bedtime.   oxyCODONE-acetaminophen (PERCOCET/ROXICET) 5-325 MG tablet Take 1 tablet by mouth every 4 (four) hours as needed for severe pain.   silver sulfADIAZINE (SILVADENE) 1 % cream Apply 1 application topically 2 (two) times daily.   traMADol (ULTRAM) 50 MG tablet Take 1 tablet (50 mg total) by mouth at bedtime.   No current facility-administered medications for this visit. (Other)      REVIEW OF SYSTEMS: ROS    Positive for: Endocrine, Eyes   Negative for: Constitutional, Gastrointestinal, Neurological, Skin, Genitourinary,  Musculoskeletal, HENT, Cardiovascular, Respiratory, Psychiatric, Allergic/Imm, Heme/Lymph   Last edited by Laddie Aquas, COA on 08/15/2019  9:37 AM. (History)       ALLERGIES No Known Allergies  PAST MEDICAL HISTORY Past Medical History:  Diagnosis Date   Bell's palsy 2005   Cataract    Diabetes mellitus without complication (HCC)    Diabetic retinopathy (HCC)    PDR OU   Hypertension    Hypertensive retinopathy    OU   Neuromuscular disorder (HCC)    NEUROPATHY   Neuropathy    Sepsis (HCC) 02/11/2016   Past Surgical History:  Procedure Laterality Date   AMPUTATION Left 03/15/2014   Procedure: AMPUTATION TRANSMETATARSAL;  Surgeon: Nadara Mustard, MD;  Location: [facility] OR;  Service: Orthopedics;  Laterality: Left;   CESAREAN SECTION     CHOLECYSTECTOMY      FAMILY HISTORY Family History  Problem Relation Age of Onset   Heart attack Mother    Diabetes Father    Diabetes Sister     SOCIAL HISTORY Social History   Tobacco Use   Smoking status: Current Every Day Smoker    Packs/day: 1.00    Years: 38.00    Pack years: 38.00    Types: Cigarettes    Last attempt to quit: 09/19/2013    Years since quitting: 5.9   Smokeless tobacco: Never Used  Substance Use Topics   Alcohol use: No   Drug use: Yes    Types: Marijuana         OPHTHALMIC EXAM:  Base Eye Exam    Visual Acuity (Snellen - Linear)      Right Left   Dist [place] 20/25 -1 20/30   Dist ph  20/25 +2 20/25       Tonometry (Tonopen, 9:32 AM)      Right Left   Pressure 18 16       Pupils      Dark Light Shape React APD   Right 3 2 Round Brisk None   Left 3 2 Round Brisk None       Visual Fields (Counting fingers)      Left Right    Full Full       Extraocular Movement      Right Left    Full, Ortho Full, Ortho       Neuro/Psych    Oriented x3: Yes   Mood/Affect: Normal       Dilation    Both eyes: 1.0% Mydriacyl, 2.5% Phenylephrine @ 9:32 AM        Slit Lamp  and Fundus Exam    Slit Lamp Exam      Right Left   Lids/Lashes Dermatochalasis - upper lid Dermatochalasis - upper lid   Conjunctiva/Sclera nasal and temporal Pinguecula nasal and temporal Pinguecula   Cornea Trace  PEE, well healed temporal cataract wounds Trace PEE   Anterior Chamber Deep and quiet Deep and quiet   Iris Round and moderately dilated, No NVI Round and dilated, No NVI   Lens PC IOL in good position, mild PC folds 2-3+ Nuclear sclerosis with early brunescence, 2-3+ Cortical cataract   Vitreous Vitreous syneresis, +RBCs/pigment in anterior vitreous, Posterior vitreous detachment, old white blood clots settled inferiorly Vitreous syneresis, +RBC in anterior vitreous, mild diffuse VH-clearing centrally, blood clots settling inferiorly; improving       Fundus Exam      Right Left   Disc Pink and Sharp, mild tilt Pink and Sharp, mild tilt   C/D Ratio 0.3 0.3   Macula Flat, Blunted foveal reflex, RPE mottling and clumping, Drusen, +cystic changes-increase from prior Flat, blunted foveal relfex, Retinal pigment epithelial mottling, Drusen, No heme or edema   Vessels Attenuated, tortuous Vascular attenuation, mild Tortuousity   Periphery Attached 360, inferior periphery obscured by old VH--white and improving, 360 PRP w/ room for posterior fill in if needed, scattered IRH, focal area of fibrosis inferior to disc with blood clot - clearing and improving Attached, good 360 PRP with good posterior fill in changes, sub hyaloid heme migrated inferiorly (6 midzone) -- boat shaped -- persistent          IMAGING AND PROCEDURES  Imaging and Procedures for @TODAY @  OCT, Retina - OU - Both Eyes       Right Eye Quality was good. Central Foveal Thickness: 267. Progression has worsened. Findings include normal foveal contour, no SRF, epiretinal membrane, vitreous traction, outer retinal atrophy, intraretinal fluid (Interval decrease in vitreous opacities, +IRF--interval increase).   Left  Eye Quality was good. Central Foveal Thickness: 251. Progression has improved. Findings include normal foveal contour, no IRF, no SRF, outer retinal atrophy, retinal drusen  (Interval improvement in vitreous opacities and subhyaloid heme on widefield scan).   Notes *Images captured and stored on drive  Diagnosis / Impression:  OD: Interval decrease in vitreous opacities, +IRF--interval increase OS: Interval improvement in vitreous opacities and subhyaloid heme on widefield scan  Clinical management:  See below  Abbreviations: NFP - Normal foveal profile. CME - cystoid macular edema. PED - pigment epithelial detachment. IRF - intraretinal fluid. SRF - subretinal fluid. EZ - ellipsoid zone. ERM - epiretinal membrane. ORA - outer retinal atrophy. ORT - outer retinal tubulation. SRHM - subretinal hyper-reflective material        Intravitreal Injection, Pharmacologic Agent - OD - Right Eye       Time Out 08/15/2019. 9:58 AM. Confirmed correct patient, procedure, site, and patient consented.   Anesthesia Topical anesthesia was used. Anesthetic medications included Lidocaine 2%, Proparacaine 0.5%.   Procedure Preparation included 5% betadine to ocular surface, eyelid speculum. A supplied needle was used.   Injection:  1.25 mg Bevacizumab (AVASTIN) SOLN   NDC: 10/15/2019, Lot: [id number] , Expiration date: 09/26/2019   Route: Intravitreal, Site: Right Eye, Waste: 0 mL  Post-op Post injection exam found visual acuity of at least counting fingers. The patient tolerated the procedure well. There were no complications. The patient received written and verbal post procedure care education.        Intravitreal Injection, Pharmacologic Agent - OS - Left Eye       Time Out 08/15/2019. 9:59 AM. Confirmed correct patient, procedure, site, and patient consented.   Anesthesia Topical anesthesia was used. Anesthetic medications included Lidocaine 2%, Proparacaine 0.5%.    Procedure Preparation included 5% betadine to ocular  surface, eyelid speculum. A (32g) needle was used.   Injection:  1.25 mg Bevacizumab (AVASTIN) SOLN   NDC: 10272-536-64, Lot: [id number] , Expiration date: 10/24/2019   Route: Intravitreal, Site: Left Eye, Waste: 0 mL  Post-op Post injection exam found visual acuity of at least counting fingers. The patient tolerated the procedure well. There were no complications. The patient received written and verbal post procedure care education.                 ASSESSMENT/PLAN:    ICD-10-CM   1. 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 Retinal edema  H35.81 OCT, Retina - OU - Both Eyes  3. Vitreous hemorrhage of right eye ([place])  H43.11   4. Essential hypertension  I10   5. Hypertensive retinopathy of both eyes  H35.033   6. Combined forms of age-related cataract of left eye  H25.812   7. Pseudophakia  Z96.1     1,2. Proliferative diabetic retinopathy w/o DME, OU  - formerly pt of Dr. [address] at [facility]  - s/p [facility] 11.15.19 -- had not been seen since then  - pt presented here 6.24.20 with progressive vision loss OD since last visit with Dr. 11-04-1992 in 2019  - OD: diffuse VH  - OS: scattered flat NV  - OCT without diabetic macular edema, both eyes   - B-scan u/s 6.24.20 shows diffuse VH OD; no RT/RD or mass  - s/p PRP OS (06.24.20), fill in (07.07.20)  - s/p PRP OD fill in (08.06.20), fill-in (10.12.20)  - s/p IVA OD #1 (06.24.20), #2 (07.22.20), #3 (08.25.20), #4 (09.28.20), #5 (10.26.20), #6 (12.09.20), #7 (01.13.21), #8 (02.24.21), #9 (04.07.21), #10 (05.05.21)  - s/p IVA OS #1 (02.24.21) -- for new subhyaloid heme/PDR, #2  (04.07.21), #3 (05.05.21)  - FA (02.03.21) shows focal blockage OD from residual VH improved, no NV; OS with persistent leaking NV  - today, BCVA at 20/25 OU  - VH clearing OD  - OS subhyaloid migrated inferiorly -- boat shaped  - recommend IVA OU (OD #11; OS #4) today, 6.2.21  - informed consent obtained for OU  - IVA OU consent form signed and scanned on 02.24.21             - f/u 4 weeks, DFE, OCT, possible injection(s), next FA July?   3. Vitreous Hemorrhage OD  - secondary to PDR as above  - s/p IVA OD #1 06.24.20, #2 (07.22.20),  #3 (08.25.20), #4 (09.28.20), #5 (10.26.20), #6 (12.09.20), #7 (01.13.21), #8 (02.24.21), #9 (04.07.21)  - s/p PRP OD fill in (08.06.20, 10.12.20)   - VH clearing and responding well to IVA  - recommend IVA OD #11 today, as above  - VH precautions reviewed -- minimize activities, keep head elevated, avoid ASA/NSAIDs/blood thinners as able   4,5. Hypertensive retinopathy OU  - discussed importance of tight BP control  - monitor  6. 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  - under the expert care of Dr. 23.12.20. Chauncey Cruel  7. Pseudophakia OD  - s/p CE/IOL OD (4.9.21, Dr. 17.9.21)  - beautiful surgery, doing well  -  monitor    Ophthalmic Meds Ordered this visit:  Meds ordered this encounter  Medications   Bevacizumab (AVASTIN) SOLN 1.25 mg   Bevacizumab (AVASTIN) SOLN 1.25 mg       Return in about 4 weeks (around 09/12/2019) for 4 wk f/u for PDR OU w/DFE/OCT/poss. FA.  There are no Patient Instructions on file for this visit.   Explained the diagnoses, plan, and follow up with the patient and they expressed understanding.  Patient expressed understanding of the importance of proper follow up care.   This document serves as a record of services personally performed by Karie ChimeraBrian G. Arantxa Piercey, MD, PhD. It was created on their behalf by Annalee Gentaaryl Barber, COMT. The  creation of this record is the provider's dictation and/or activities during the visit.  Electronically signed by: Annalee Gentaaryl Barber, COMT 08/18/19 12:11 AM   This document serves as a record of services personally performed by Karie ChimeraBrian G. Jaylissa Felty, MD, PhD. It was created on their behalf by Laurian BrimAmanda Brown, OA, an ophthalmic assistant. The creation of this record is the provider's dictation and/or activities during the visit.    Electronically signed by: Laurian BrimAmanda Brown, OA 06.02.2021 12:11 AM  Karie ChimeraBrian G. Donnavin Vandenbrink, M.D., Ph.D. Diseases & Surgery of the Retina and Vitreous [facility] 08/15/2019   I have reviewed the above documentation for accuracy and completeness, and I agree with the above. Karie ChimeraBrian G. Janard Culp, M.D., Ph.D. 08/18/19 12:11 AM   Abbreviations: M myopia (nearsighted); A astigmatism; H hyperopia (farsighted); P presbyopia; Mrx spectacle prescription;  CTL contact lenses; OD right eye; OS left eye; OU both eyes  XT exotropia; ET esotropia; PEK punctate epithelial keratitis; PEE punctate epithelial erosions; DES dry eye syndrome; MGD meibomian gland dysfunction; ATs artificial tears; PFAT's preservative free artificial tears; NSC nuclear sclerotic cataract; PSC posterior subcapsular cataract; ERM epi-retinal membrane; PVD posterior vitreous detachment; RD retinal detachment; DM diabetes mellitus; DR diabetic retinopathy; NPDR non-proliferative diabetic retinopathy; PDR proliferative diabetic retinopathy; CSME clinically significant macular edema; DME diabetic macular edema; dbh dot blot hemorrhages; CWS cotton wool spot; POAG primary open angle glaucoma; C/D cup-to-disc ratio; HVF humphrey visual field; GVF goldmann visual field; OCT optical coherence tomography; IOP intraocular pressure; BRVO Branch retinal vein occlusion; CRVO central retinal vein occlusion; CRAO central retinal artery occlusion; BRAO branch retinal artery occlusion; RT retinal tear; SB scleral buckle; PPV pars plana  vitrectomy; VH Vitreous hemorrhage; PRP panretinal laser photocoagulation; IVK intravitreal kenalog; VMT vitreomacular traction; MH Macular hole;  NVD neovascularization of the disc; NVE neovascularization elsewhere; AREDS age related eye disease study; ARMD age related macular degeneration; POAG primary open angle glaucoma; EBMD epithelial/anterior basement membrane dystrophy; ACIOL anterior chamber intraocular lens; IOL intraocular lens; PCIOL posterior chamber intraocular lens; Phaco/IOL phacoemulsification with intraocular lens placement; PRK photorefractive keratectomy; LASIK laser assisted in situ keratomileusis; HTN hypertension; DM diabetes mellitus; COPD chronic obstructive pulmonary disease

## 2019-08-15 ENCOUNTER — Encounter (INDEPENDENT_AMBULATORY_CARE_PROVIDER_SITE_OTHER): Payer: Self-pay | Admitting: Ophthalmology

## 2019-08-15 ENCOUNTER — Ambulatory Visit (INDEPENDENT_AMBULATORY_CARE_PROVIDER_SITE_OTHER): Payer: Medicaid Other | Admitting: Ophthalmology

## 2019-08-15 ENCOUNTER — Other Ambulatory Visit: Payer: Self-pay

## 2019-08-15 DIAGNOSIS — E113593 Type 2 diabetes mellitus with proliferative diabetic retinopathy without macular edema, bilateral: Secondary | ICD-10-CM | POA: Diagnosis not present

## 2019-08-15 DIAGNOSIS — I10 Essential (primary) hypertension: Secondary | ICD-10-CM

## 2019-08-15 DIAGNOSIS — H25812 Combined forms of age-related cataract, left eye: Secondary | ICD-10-CM

## 2019-08-15 DIAGNOSIS — H3581 Retinal edema: Secondary | ICD-10-CM

## 2019-08-15 DIAGNOSIS — H35033 Hypertensive retinopathy, bilateral: Secondary | ICD-10-CM

## 2019-08-15 DIAGNOSIS — H4311 Vitreous hemorrhage, right eye: Secondary | ICD-10-CM | POA: Diagnosis not present

## 2019-08-15 DIAGNOSIS — Z961 Presence of intraocular lens: Secondary | ICD-10-CM

## 2019-08-15 MED ORDER — BEVACIZUMAB CHEMO INJECTION 1.25MG/0.05ML SYRINGE FOR KALEIDOSCOPE
1.2500 mg | INTRAVITREAL | Status: AC | PRN
  Administered 2019-08-15: 1.25 mg via INTRAVITREAL

## 2019-09-19 NOTE — Progress Notes (Signed)
[facility] - Clinic Note  09/21/2019     CHIEF COMPLAINT Patient presents for Retina Follow Up   HISTORY OF PRESENT ILLNESS: Anna Wagner is a 59 y.o. female who presents to the clinic today for:   HPI    Retina Follow Up    Patient presents with  Diabetic Retinopathy.  In both eyes.  Severity is moderate.  Duration of 5 weeks.  Since onset it is stable.  I, the attending physician,  performed the HPI with the patient and updated documentation appropriately.          Comments    Patient states vision improved some OU. Last a1c was 7.4,checked 3-4 months ago.        Last edited by Rennis Chris, MD on 09/22/2019  2:29 AM. (History)    Patient states vision seems hazy centrally OD  Referring physician: Hoy Register, MD [address] [place],  [place] [address]  HISTORICAL INFORMATION:   Selected notes from the MEDICAL RECORD NUMBER Referred by [facility] Former pt of Dr. Scharlene Corn LEE: 11.15.19 (N. Marchase) [BCVA: OD: 20/60 OS: 20/30] Ocular Hx-NPDR OS, PDR OD, vitreous heme, cataracts OU (pt received IVL OU and PRP OU on 11.15.19)  PMH-DM (A1c: 10.1 05.01.19, takes Humulin and metformin), arthritis, depression, HTN, HLD, neuropathy   CURRENT MEDICATIONS: Current Outpatient Medications (Ophthalmic Drugs)  Medication Sig  . LOTEMAX 0.5 % GEL APPLY 1 DROP TO AFFECTED EYE 4 TIMES A DAY FOR 7 DAYS (Patient not taking: Reported on 09/21/2019)   No current facility-administered medications for this visit. (Ophthalmic Drugs)   Current Outpatient Medications (Other)  Medication Sig  . acetaminophen (TYLENOL) 500 MG tablet Take 1,000 mg by mouth every 6 (six) hours as needed for fever.  Marland Kitchen amitriptyline (ELAVIL) 100 MG tablet Take 1 tablet (100 mg total) by mouth at bedtime.  Marland Kitchen aspirin EC 81 MG tablet Take 81 mg by mouth at bedtime.   Marland Kitchen atorvastatin (LIPITOR) 40 MG tablet Take 1 tablet (40 mg total) by mouth daily.  . cephALEXin (KEFLEX)  500 MG capsule Take 1 capsule (500 mg total) by mouth 2 (two) times daily.  . cetirizine (ZYRTEC) 10 MG tablet Take 1 tablet (10 mg total) by mouth daily.  . clotrimazole (LOTRIMIN) 1 % cream Apply 1 application topically 2 (two) times daily.  . Cyanocobalamin (VITAMIN B-12 PO) Take 1 tablet by mouth 2 (two) times daily.  . DULoxetine (CYMBALTA) 60 MG capsule Take 1 capsule (60 mg total) by mouth daily.  . furosemide (LASIX) 20 MG tablet TAKE 1 TABLET (20 MG TOTAL) BY MOUTH DAILY.  Marland Kitchen guaiFENesin (MUCINEX) 600 MG 12 hr tablet Take 1 tablet (600 mg total) by mouth 2 (two) times daily.  . insulin NPH-regular Human (HUMULIN 70/30) (70-30) 100 UNIT/ML injection Inject subcutaneously twice daily 55 units before breakfast and 60 units dinner  . Insulin Syringe-Needle U-100 (TRUEPLUS INSULIN SYRINGE) 30G X 5/16" 1 ML MISC Use as directed  . lisinopril (PRINIVIL,ZESTRIL) 20 MG tablet Take 1 tablet (20 mg total) by mouth daily.  Marland Kitchen LYRICA 100 MG capsule Take 1 capsule by mouth 3 times a day as directed by physician.  . metFORMIN (GLUCOPHAGE) 1000 MG tablet TAKE 1 TABLET BY MOUTH TWICE DAILY WITH A MEAL  . metoprolol tartrate (LOPRESSOR) 50 MG tablet Take 1 tablet (50 mg total) by mouth 2 (two) times daily.  Marland Kitchen oxybutynin (DITROPAN XL) 10 MG 24 hr tablet Take 1 tablet (  10 mg total) by mouth at bedtime.  Marland Kitchen oxyCODONE-acetaminophen (PERCOCET/ROXICET) 5-325 MG tablet Take 1 tablet by mouth every 4 (four) hours as needed for severe pain.  . silver sulfADIAZINE (SILVADENE) 1 % cream Apply 1 application topically 2 (two) times daily.  . traMADol (ULTRAM) 50 MG tablet Take 1 tablet (50 mg total) by mouth at bedtime.   No current facility-administered medications for this visit. (Other)      REVIEW OF SYSTEMS: ROS    Positive for: Endocrine, Eyes   Negative for: Constitutional, Gastrointestinal, Neurological, Skin, Genitourinary, Musculoskeletal, HENT, Cardiovascular, Respiratory, Psychiatric, Allergic/Imm,  Heme/Lymph   Last edited by Annalee Genta D, COT on 09/21/2019  1:21 PM. (History)       ALLERGIES No Known Allergies  PAST MEDICAL HISTORY Past Medical History:  Diagnosis Date  . Bell's palsy 2005  . Cataract   . Diabetes mellitus without complication (HCC)   . Diabetic retinopathy (HCC)    PDR OU  . Hypertension   . Hypertensive retinopathy    OU  . Neuromuscular disorder (HCC)    NEUROPATHY  . Neuropathy   . Sepsis (HCC) 02/11/2016   Past Surgical History:  Procedure Laterality Date  . AMPUTATION Left 03/15/2014   Procedure: AMPUTATION TRANSMETATARSAL;  Surgeon: Nadara Mustard, MD;  Location: [facility] OR;  Service: Orthopedics;  Laterality: Left;  . CESAREAN SECTION    . CHOLECYSTECTOMY      FAMILY HISTORY Family History  Problem Relation Age of Onset  . Heart attack Mother   . Diabetes Father   . Diabetes Sister     SOCIAL HISTORY Social History   Tobacco Use  . Smoking status: Current Every Day Smoker    Packs/day: 1.00    Years: 38.00    Pack years: 38.00    Types: Cigarettes    Last attempt to quit: 09/19/2013    Years since quitting: 6.0  . Smokeless tobacco: Never Used  Substance Use Topics  . Alcohol use: No  . Drug use: Yes    Types: Marijuana         OPHTHALMIC EXAM:  Base Eye Exam    Visual Acuity (Snellen - Linear)      Right Left   Dist [place] 20/40 20/30 -2   Dist ph [place] 20/30 -1 20/25 -1       Tonometry (Tonopen, 1:35 PM)      Right Left   Pressure 15 17       Pupils      Dark Light Shape React APD   Right 3 2 Round Brisk None   Left 3 2 Round Brisk None       Visual Fields (Counting fingers)      Left Right    Full Full       Extraocular Movement      Right Left    Full, Ortho Full, Ortho       Neuro/Psych    Oriented x3: Yes   Mood/Affect: Normal       Dilation    Both eyes: 1.0% Mydriacyl, 2.5% Phenylephrine @ 1:36 PM        Slit Lamp and Fundus Exam    Slit Lamp Exam      Right Left   Lids/Lashes  Dermatochalasis - upper lid Dermatochalasis - upper lid   Conjunctiva/Sclera nasal and temporal Pinguecula nasal and temporal Pinguecula   Cornea Trace PEE, well healed temporal cataract wounds Trace PEE, well healed temporal cataract wounds   Anterior Chamber  Deep and quiet Deep and quiet   Iris Round and moderately dilated, No NVI Round and dilated, No NVI   Lens PC IOL in good position, mild PC folds PC IOL in good position with PC folds   Vitreous Vitreous syneresis, +pigment in anterior vitreous, Posterior vitreous detachment, old white blood clots settled inferiorly Vitreous syneresis, +RBC in anterior vitreous, mild diffuse VH-clearing centrally, blood clots settling inferiorly; improving       Fundus Exam      Right Left   Disc Pink and Sharp, mild tilt Pink and Sharp, mild tilt, Compact   C/D Ratio 0.3 0.3   Macula Flat, Blunted foveal reflex, RPE mottling and clumping, Drusen, +cystic changes - slightly improved Flat, blunted foveal relfex, Retinal pigment epithelial mottling, Drusen, No heme or edema   Vessels Attenuated, tortuous Vascular attenuation, mild Tortuousity   Periphery Attached 360, inferior periphery obscured by old VH--white and improving, 360 PRP w/ room for posterior fill in if needed, scattered IRH, focal area of fibrosis inferior to disc with blood clot - clearing and improving Attached, good 360 PRP with good posterior fill in changes, sub hyaloid heme inferior to disc -- boat shaped -- improving        Refraction    Manifest Refraction      Sphere Cylinder Axis Dist VA   Right -0.25 +0.50 125 20/30+1   Left -0.50 +0.25 145 20/25          IMAGING AND PROCEDURES  Imaging and Procedures for @TODAY @  OCT, Retina - OU - Both Eyes       Right Eye Quality was borderline. Central Foveal Thickness: 282. Progression has improved. Findings include normal foveal contour, no SRF, epiretinal membrane, vitreous traction, outer retinal atrophy, intraretinal fluid  (Mild interval improvement in temporal IRF).   Left Eye Quality was good. Central Foveal Thickness: 253. Progression has improved. Findings include normal foveal contour, no IRF, no SRF, outer retinal atrophy, retinal drusen  (Persistent vitreous opacities; partial PVD).   Notes *Images captured and stored on drive  Diagnosis / Impression:  OD: Mild interval improvement in temporal IRF OS: Persistent vitreous opacities; partial PVD  Clinical management:  See below  Abbreviations: NFP - Normal foveal profile. CME - cystoid macular edema. PED - pigment epithelial detachment. IRF - intraretinal fluid. SRF - subretinal fluid. EZ - ellipsoid zone. ERM - epiretinal membrane. ORA - outer retinal atrophy. ORT - outer retinal tubulation. SRHM - subretinal hyper-reflective material        Intravitreal Injection, Pharmacologic Agent - OD - Right Eye       Time Out 09/21/2019. 2:26 PM. Confirmed correct patient, procedure, site, and patient consented.   Anesthesia Topical anesthesia was used. Anesthetic medications included Lidocaine 2%, Proparacaine 0.5%.   Procedure Preparation included 5% betadine to ocular surface, eyelid speculum. A supplied needle was used.   Injection:  1.25 mg Bevacizumab (AVASTIN) SOLN   NDC: 11/22/2019, Lot: [id number] , Expiration date: 11/07/2019   Route: Intravitreal, Site: Right Eye, Waste: 0 mg  Post-op Post injection exam found visual acuity of at least counting fingers. The patient tolerated the procedure well. There were no complications. The patient received written and verbal post procedure care education.        Intravitreal Injection, Pharmacologic Agent - OS - Left Eye       Time Out 09/21/2019. 2:26 PM. Confirmed correct patient, procedure, site, and patient consented.   Anesthesia Topical anesthesia was used. Anesthetic medications included Lidocaine 2%, Proparacaine  0.5%.   Procedure Preparation included 5% betadine to ocular surface,  eyelid speculum. A supplied needle was used.   Injection:  1.25 mg Bevacizumab (AVASTIN) SOLN   NDC: 01093-235-57, Lot: [id number] , Expiration date: 11/14/2019   Route: Intravitreal, Site: Left Eye, Waste: 0 mg  Post-op Post injection exam found visual acuity of at least counting fingers. The patient tolerated the procedure well. There were no complications. The patient received written and verbal post procedure care education.        Fluorescein Angiography Optos (Transit OS)       Right Eye   Progression has been stable. Early phase findings include blockage, staining, vascular perfusion defect, microaneurysm. Mid/Late phase findings include blockage, staining, leakage, microaneurysm (No NV).   Left Eye   Progression has improved. Early phase findings include vascular perfusion defect, staining, microaneurysm. Mid/Late phase findings include staining, leakage, vascular perfusion defect, microaneurysm (No NV).   Notes Images stored on drive;   Impression: PDR OU No active NV OU Persistent late leaking MA OU                  ASSESSMENT/PLAN:    ICD-10-CM   1. Proliferative diabetic retinopathy of both eyes without macular edema associated with type 2 diabetes mellitus (HCC)  01/14/2020 Intravitreal Injection, Pharmacologic Agent - OD - Right Eye    Intravitreal Injection, Pharmacologic Agent - OS - Left Eye    Fluorescein Angiography Optos (Transit OS)    Bevacizumab (AVASTIN) SOLN 1.25 mg    Bevacizumab (AVASTIN) SOLN 1.25 mg  2. Retinal edema  H35.81 OCT, Retina - OU - Both Eyes  3. Vitreous hemorrhage of right eye (HCC)  H43.11 Intravitreal Injection, Pharmacologic Agent - OD - Right Eye    Bevacizumab (AVASTIN) SOLN 1.25 mg  4. Essential hypertension  I10   5. Hypertensive retinopathy of both eyes  H35.033 Fluorescein Angiography Optos (Transit OS)  6. Pseudophakia  Z96.1     1,2. Proliferative diabetic retinopathy w/o DME, OU  - formerly pt of Dr.  07-06-2001 at [facility]  - s/p PRP OU 11.15.19 -- had not been seen since then  - pt presented here 6.24.20 with progressive vision loss OD since last visit with Dr. 08-29-2003 in 2019  - OD: diffuse VH  - OS: scattered flat NV  - OCT without diabetic macular edema, both eyes   - B-scan u/s 6.24.20 shows diffuse VH OD; no RT/RD or mass  - s/p PRP OS (06.24.20), fill in (07.07.20)  - s/p PRP OD fill in (08.06.20), fill-in (10.12.20)  - s/p IVA OD #1 (06.24.20), #2 (07.22.20), #3 (08.25.20), #4 (09.28.20), #5 (10.26.20), #6 (12.09.20), #7 (01.13.21), #8 (02.24.21), #9 (04.07.21), #10 (05.05.21), #11 (06.02.21)  - s/p IVA OS #1 (02.24.21) -- for new subhyaloid heme/PDR, #2 (04.07.21), #3 (05.05.21), #4 (06.02.21)  - FA (07.09.21) shows no NV OU, persistent late leaking MA;s OU  - today, BCVA decreased to 20/30 from 20/25 OD; OS stable at 20/25  - VH clearing OD  - OS subhyaloid migrating inferiorly and clearing  - recommend IVA OU (OD #12; OS #5) today, 07.09.21  - informed consent obtained for OU  - IVA OU consent form signed and scanned on 02.24.21             - f/u 5-6 weeks, DFE, OCT, possible injection(s)   3. Vitreous Hemorrhage OD  - secondary to PDR as above  - s/p IVA OD #1 06.24.20, #2 (07.22.20),  #3 (08.25.20), #4 (09.28.20), #  5 (10.26.20), #6 (12.09.20), #7 (01.13.21), #8 (02.24.21), #9 (04.07.21), #10 (05.05.21), #11 (06.02.21)  - s/p PRP OD fill in (08.06.20, 10.12.20)   - VH clearing and responding well to IVA  - recommend IVA OD #11 today, as above  - VH precautions reviewed -- minimize activities, keep head elevated, avoid ASA/NSAIDs/blood thinners as able   4,5. Hypertensive retinopathy OU  - discussed importance of tight BP control  - monitor  6. Pseudophakia OU  - s/p CE/IOL OD (4.9.21, Dr. Laruth BouchardS. Groat)  - beautiful surgery, doing well  - monitor    Ophthalmic Meds Ordered this visit:  Meds ordered this encounter  Medications  . Bevacizumab (AVASTIN)  SOLN 1.25 mg  . Bevacizumab (AVASTIN) SOLN 1.25 mg       Return for f/u 5-6 weeks, PDR OU, DFE, OCT.  There are no Patient Instructions on file for this visit.   Explained the diagnoses, plan, and follow up with the patient and they expressed understanding.  Patient expressed understanding of the importance of proper follow up care.   This document serves as a record of services personally performed by Karie ChimeraBrian G. Merrily Tegeler, MD, PhD. It was created on their behalf by Annalee Gentaaryl Barber, COMT. The creation of this record is the provider's dictation and/or activities during the visit.  Electronically signed by: Annalee Gentaaryl Barber, COMT 09/22/19 2:34 AM    Karie ChimeraBrian G. Cleta Heatley, M.D., Ph.D. Diseases & Surgery of the Retina and Vitreous [facility]  I have reviewed the above documentation for accuracy and completeness, and I agree with the above. Karie ChimeraBrian G. Nikia Mangino, M.D., Ph.D. 09/22/19 2:34 AM   Abbreviations: M myopia (nearsighted); A astigmatism; H hyperopia (farsighted); P presbyopia; Mrx spectacle prescription;  CTL contact lenses; OD right eye; OS left eye; OU both eyes  XT exotropia; ET esotropia; PEK punctate epithelial keratitis; PEE punctate epithelial erosions; DES dry eye syndrome; MGD meibomian gland dysfunction; ATs artificial tears; PFAT's preservative free artificial tears; NSC nuclear sclerotic cataract; PSC posterior subcapsular cataract; ERM epi-retinal membrane; PVD posterior vitreous detachment; RD retinal detachment; DM diabetes mellitus; DR diabetic retinopathy; NPDR non-proliferative diabetic retinopathy; PDR proliferative diabetic retinopathy; CSME clinically significant macular edema; DME diabetic macular edema; dbh dot blot hemorrhages; CWS cotton wool spot; POAG primary open angle glaucoma; C/D cup-to-disc ratio; HVF humphrey visual field; GVF goldmann visual field; OCT optical coherence tomography; IOP intraocular pressure; BRVO Branch retinal vein occlusion; CRVO  central retinal vein occlusion; CRAO central retinal artery occlusion; BRAO branch retinal artery occlusion; RT retinal tear; SB scleral buckle; PPV pars plana vitrectomy; VH Vitreous hemorrhage; PRP panretinal laser photocoagulation; IVK intravitreal kenalog; VMT vitreomacular traction; MH Macular hole;  NVD neovascularization of the disc; NVE neovascularization elsewhere; AREDS age related eye disease study; ARMD age related macular degeneration; POAG primary open angle glaucoma; EBMD epithelial/anterior basement membrane dystrophy; ACIOL anterior chamber intraocular lens; IOL intraocular lens; PCIOL posterior chamber intraocular lens; Phaco/IOL phacoemulsification with intraocular lens placement; PRK photorefractive keratectomy; LASIK laser assisted in situ keratomileusis; HTN hypertension; DM diabetes mellitus; COPD chronic obstructive pulmonary disease

## 2019-09-21 ENCOUNTER — Ambulatory Visit (INDEPENDENT_AMBULATORY_CARE_PROVIDER_SITE_OTHER): Payer: Medicaid Other | Admitting: Ophthalmology

## 2019-09-21 ENCOUNTER — Other Ambulatory Visit: Payer: Self-pay

## 2019-09-21 ENCOUNTER — Encounter (INDEPENDENT_AMBULATORY_CARE_PROVIDER_SITE_OTHER): Payer: Self-pay | Admitting: Ophthalmology

## 2019-09-21 DIAGNOSIS — H35033 Hypertensive retinopathy, bilateral: Secondary | ICD-10-CM | POA: Diagnosis not present

## 2019-09-21 DIAGNOSIS — I10 Essential (primary) hypertension: Secondary | ICD-10-CM | POA: Diagnosis not present

## 2019-09-21 DIAGNOSIS — E113593 Type 2 diabetes mellitus with proliferative diabetic retinopathy without macular edema, bilateral: Secondary | ICD-10-CM

## 2019-09-21 DIAGNOSIS — H3581 Retinal edema: Secondary | ICD-10-CM | POA: Diagnosis not present

## 2019-09-21 DIAGNOSIS — Z961 Presence of intraocular lens: Secondary | ICD-10-CM

## 2019-09-21 DIAGNOSIS — H4311 Vitreous hemorrhage, right eye: Secondary | ICD-10-CM | POA: Diagnosis not present

## 2019-09-22 ENCOUNTER — Encounter (INDEPENDENT_AMBULATORY_CARE_PROVIDER_SITE_OTHER): Payer: Self-pay | Admitting: Ophthalmology

## 2019-09-22 MED ORDER — BEVACIZUMAB CHEMO INJECTION 1.25MG/0.05ML SYRINGE FOR KALEIDOSCOPE
1.2500 mg | INTRAVITREAL | Status: AC | PRN
  Administered 2019-09-22: 1.25 mg via INTRAVITREAL

## 2019-10-23 NOTE — Progress Notes (Signed)
[facility] - Clinic Note  10/26/2019     CHIEF COMPLAINT Patient presents for Retina Follow Up   HISTORY OF PRESENT ILLNESS: Anna Wagner is a 59 y.o. female who presents to the clinic today for:   HPI    Retina Follow Up    Patient presents with  Diabetic Retinopathy.  In both eyes.  Duration of 4 weeks.  Since onset it is gradually improving.  I, the attending physician,  performed the HPI with the patient and updated documentation appropriately.          Comments    5 week follow up PDR OU-  She had a hazy spot in central vision OD, since last visit it has cleared up.   Using Systane PF QID OU.   BS: 179 this morning, A1C: 7.4       Last edited by Rennis Chris, MD on 10/30/2019  1:17 AM. (History)    Patient states vision has improved, the hazy spot she had in her right eye has improved  Referring physician: [facility], [address] [facility] [address] [place],  [place] [address]  HISTORICAL INFORMATION:   Selected notes from the MEDICAL RECORD NUMBER Referred by [facility] Former pt of Dr. Scharlene Corn LEE: 11.15.19 (N. Marchase) [BCVA: OD: 20/60 OS: 20/30] Ocular Hx-NPDR OS, PDR OD, vitreous heme, cataracts OU (pt received IVL OU and PRP OU on 11.15.19)  PMH-DM (A1c: 10.1 05.01.19, takes Humulin and metformin), arthritis, depression, HTN, HLD, neuropathy   CURRENT MEDICATIONS: Current Outpatient Medications (Ophthalmic Drugs)  Medication Sig  . LOTEMAX 0.5 % GEL APPLY 1 DROP TO AFFECTED EYE 4 TIMES A DAY FOR 7 DAYS (Patient not taking: Reported on 09/21/2019)   No current facility-administered medications for this visit. (Ophthalmic Drugs)   Current Outpatient Medications (Other)  Medication Sig  . acetaminophen (TYLENOL) 500 MG tablet Take 1,000 mg by mouth every 6 (six) hours as needed for fever.  Marland Kitchen amitriptyline (ELAVIL) 100 MG tablet Take 1 tablet (100 mg total) by mouth at bedtime.  Marland Kitchen aspirin EC 81 MG tablet  Take 81 mg by mouth at bedtime.   Marland Kitchen atorvastatin (LIPITOR) 40 MG tablet Take 1 tablet (40 mg total) by mouth daily.  . cephALEXin (KEFLEX) 500 MG capsule Take 1 capsule (500 mg total) by mouth 2 (two) times daily.  . cetirizine (ZYRTEC) 10 MG tablet Take 1 tablet (10 mg total) by mouth daily.  . clotrimazole (LOTRIMIN) 1 % cream Apply 1 application topically 2 (two) times daily.  . Cyanocobalamin (VITAMIN B-12 PO) Take 1 tablet by mouth 2 (two) times daily.  . DULoxetine (CYMBALTA) 60 MG capsule Take 1 capsule (60 mg total) by mouth daily.  . furosemide (LASIX) 20 MG tablet TAKE 1 TABLET (20 MG TOTAL) BY MOUTH DAILY.  Marland Kitchen guaiFENesin (MUCINEX) 600 MG 12 hr tablet Take 1 tablet (600 mg total) by mouth 2 (two) times daily.  . insulin NPH-regular Human (HUMULIN 70/30) (70-30) 100 UNIT/ML injection Inject subcutaneously twice daily 55 units before breakfast and 60 units dinner  . Insulin Syringe-Needle U-100 (TRUEPLUS INSULIN SYRINGE) 30G X 5/16" 1 ML MISC Use as directed  . lisinopril (PRINIVIL,ZESTRIL) 20 MG tablet Take 1 tablet (20 mg total) by mouth daily.  Marland Kitchen LYRICA 100 MG capsule Take 1 capsule by mouth 3 times a day as directed by physician.  . metFORMIN (GLUCOPHAGE) 1000 MG tablet TAKE 1 TABLET BY MOUTH TWICE DAILY WITH A  MEAL  . metoprolol tartrate (LOPRESSOR) 50 MG tablet Take 1 tablet (50 mg total) by mouth 2 (two) times daily.  Marland Kitchen oxybutynin (DITROPAN XL) 10 MG 24 hr tablet Take 1 tablet (10 mg total) by mouth at bedtime.  Marland Kitchen oxyCODONE-acetaminophen (PERCOCET/ROXICET) 5-325 MG tablet Take 1 tablet by mouth every 4 (four) hours as needed for severe pain.  . silver sulfADIAZINE (SILVADENE) 1 % cream Apply 1 application topically 2 (two) times daily.  . traMADol (ULTRAM) 50 MG tablet Take 1 tablet (50 mg total) by mouth at bedtime.   No current facility-administered medications for this visit. (Other)      REVIEW OF SYSTEMS: ROS    Positive for: Neurological, Endocrine, Eyes, Psychiatric    Negative for: Constitutional, Gastrointestinal, Skin, Genitourinary, Musculoskeletal, HENT, Cardiovascular, Respiratory, Allergic/Imm, Heme/Lymph   Last edited by Joni Reining, COA on 10/26/2019  1:49 PM. (History)       ALLERGIES No Known Allergies  PAST MEDICAL HISTORY Past Medical History:  Diagnosis Date  . Bell's palsy 2005  . Cataract   . Diabetes mellitus without complication (HCC)   . Diabetic retinopathy (HCC)    PDR OU  . Hypertension   . Hypertensive retinopathy    OU  . Neuromuscular disorder (HCC)    NEUROPATHY  . Neuropathy   . Sepsis (HCC) 02/11/2016   Past Surgical History:  Procedure Laterality Date  . AMPUTATION Left 03/15/2014   Procedure: AMPUTATION TRANSMETATARSAL;  Surgeon: Nadara Mustard, MD;  Location: [facility] OR;  Service: Orthopedics;  Laterality: Left;  . CESAREAN SECTION    . CHOLECYSTECTOMY      FAMILY HISTORY Family History  Problem Relation Age of Onset  . Heart attack Mother   . Diabetes Father   . Diabetes Sister     SOCIAL HISTORY Social History   Tobacco Use  . Smoking status: Current Every Day Smoker    Packs/day: 1.00    Years: 38.00    Pack years: 38.00    Types: Cigarettes    Last attempt to quit: 09/19/2013    Years since quitting: 6.1  . Smokeless tobacco: Never Used  Substance Use Topics  . Alcohol use: No  . Drug use: Yes    Types: Marijuana         OPHTHALMIC EXAM:  Base Eye Exam    Visual Acuity (Snellen - Linear)      Right Left   Dist [place] 20/30 +1 20/30 -2   Dist ph [place] 20/25 20/25 -1       Tonometry (Tonopen, 1:54 PM)      Right Left   Pressure 14 15       Pupils      Dark Light Shape React APD   Right 3 2 Round Brisk None   Left 3 2 Round Brisk None       Visual Fields (Counting fingers)      Left Right    Full Full       Extraocular Movement      Right Left    Full Full       Neuro/Psych    Oriented x3: Yes   Mood/Affect: Normal       Dilation    Both eyes: 1.0% Mydriacyl, 2.5%  Phenylephrine @ 1:54 PM        Slit Lamp and Fundus Exam    Slit Lamp Exam      Right Left   Lids/Lashes Dermatochalasis - upper lid Dermatochalasis - upper lid  Conjunctiva/Sclera nasal and temporal Pinguecula nasal and temporal Pinguecula   Cornea Trace PEE, well healed temporal cataract wounds Trace PEE, well healed temporal cataract wounds   Anterior Chamber Deep and quiet Deep and quiet   Iris Round and moderately dilated, No NVI Round and dilated, No NVI   Lens PC IOL in good position, mild PC folds PC IOL in good position with PC folds   Vitreous Vitreous syneresis, +pigment in anterior vitreous, Posterior vitreous detachment, old white blood clots settled inferiorly Vitreous syneresis, +RBC in anterior vitreous, mild diffuse VH-clearing centrally, blood clots settling inferiorly; improving       Fundus Exam      Right Left   Disc Pink and Sharp, mild tilt, Compact Pink and Sharp, mild tilt, Compact   C/D Ratio 0.3 0.3   Macula Flat, Blunted foveal reflex, RPE mottling and clumping, Drusen, +cystic changes - slightly improved Flat, blunted foveal relfex, Retinal pigment epithelial mottling, Drusen, No heme or edema   Vessels Attenuated, tortuous Vascular attenuation, mild Tortuousity   Periphery Attached 360, inferior periphery obscured by old VH--white and improving, 360 PRP w/ room for posterior fill in if needed, scattered IRH, focal area of fibrosis inferior to disc with blood clot - clearing and improving Attached, good 360 PRP with good posterior fill in changes, sub hyaloid heme inferior to disc -- boat shaped -- improving          IMAGING AND PROCEDURES  Imaging and Procedures for @TODAY @  OCT, Retina - OU - Both Eyes       Right Eye Quality was borderline. Central Foveal Thickness: 264. Progression has improved. Findings include normal foveal contour, no SRF, epiretinal membrane, vitreous traction, outer retinal atrophy, intraretinal fluid (interval improvement in  temporal IRF, mild interval improvement in vitreous opacitites).   Left Eye Quality was good. Central Foveal Thickness: 261. Progression has improved. Findings include normal foveal contour, no IRF, no SRF, outer retinal atrophy, retinal drusen  (Mild interval improvement in vitreous opacities; partial PVD).   Notes *Images captured and stored on drive  Diagnosis / Impression:  OD: Mild interval improvement in temporal IRF OS: interval improvement in vitreous opacities; partial PVD  Clinical management:  See below  Abbreviations: NFP - Normal foveal profile. CME - cystoid macular edema. PED - pigment epithelial detachment. IRF - intraretinal fluid. SRF - subretinal fluid. EZ - ellipsoid zone. ERM - epiretinal membrane. ORA - outer retinal atrophy. ORT - outer retinal tubulation. SRHM - subretinal hyper-reflective material        Intravitreal Injection, Pharmacologic Agent - OD - Right Eye       Time Out 10/26/2019. 2:10 PM. Confirmed correct patient, procedure, site, and patient consented.   Anesthesia Topical anesthesia was used. Anesthetic medications included Lidocaine 2%, Proparacaine 0.5%.   Procedure Preparation included 5% betadine to ocular surface, eyelid speculum. A supplied needle was used.   Injection:  1.25 mg Bevacizumab (AVASTIN) SOLN   NDC: 70360-001-02, [id number], Expiration date: 11/26/2019   Route: Intravitreal, Site: Right Eye, Waste: 0.05 mL  Post-op Post injection exam found visual acuity of at least counting fingers. The patient tolerated the procedure well. There were no complications. The patient received written and verbal post procedure care education.        Intravitreal Injection, Pharmacologic Agent - OS - Left Eye       Time Out 10/26/2019. 2:27 PM. Confirmed correct patient, procedure, site, and patient consented.   Anesthesia Topical anesthesia was used. Anesthetic medications  included Lidocaine 2%, Proparacaine 0.5%.    Procedure Preparation included 5% betadine to ocular surface, eyelid speculum. A supplied needle was used.   Injection:  1.25 mg Bevacizumab (AVASTIN) SOLN   NDC: 16109-604-5450242-060-01, Lot: [id number] , Expiration date: 11/22/2019   Route: Intravitreal, Site: Left Eye, Waste: 0 mg  Post-op Post injection exam found visual acuity of at least counting fingers. The patient tolerated the procedure well. There were no complications. The patient received written and verbal post procedure care education.                 ASSESSMENT/PLAN:    ICD-10-CM   1. 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 Retinal edema  H35.81 OCT, Retina - OU - Both Eyes  3. Vitreous hemorrhage of right eye (HCC)  H43.11 Intravitreal Injection, Pharmacologic Agent - OD - Right Eye    Bevacizumab (AVASTIN) SOLN 1.25 mg  4. Essential hypertension  I10   5. Hypertensive retinopathy of both eyes  H35.033   6. Pseudophakia  Z96.1   7. 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 Proliferative diabetic retinopathy w/o DME, OU  - [facility]  - s/p PRP OU 11.15.19 -- had not been seen since then  - pt presented here 6.24.20 with progressive vision loss OD since last visit with Dr. Robin SearingMarchase in 2019  - OD: diffuse VH  - OS: scattered flat NV  - OCT without diabetic macular edema, both eyes   - B-scan u/s 6.24.20 shows diffuse VH OD; no RT/RD or mass  - s/p PRP OS (06.24.20), fill in (07.07.20)  - s/p PRP OD fill in (08.06.20), fill-in (10.12.20)  - s/p IVA OD #1 (06.24.20), #2 (07.22.20), #3 (08.25.20), #4 (09.28.20), #5 (10.26.20), #6 (12.09.20), #7 (01.13.21), #8  (02.24.21), #9 (04.07.21), #10 (05.05.21), #11 (06.02.21), #12 (07.09.21)  - s/p IVA OS #1 (02.24.21) -- for new subhyaloid heme/PDR, #2 (04.07.21), #3 (05.05.21), #4 (06.02.21), #5 (07.09.21)  - FA (07.09.21) shows no NV OU, persistent late leaking MA;s OU  - today, BCVA 20/25 OU  - VH clearing OD  - OS subhyaloid migrating inferiorly and clearing  - recommend IVA OU (OD #13; OS #6) today, 08.13.21  - informed consent obtained for OU  - IVA OU consent form signed and scanned on 02.24.21             - f/u 6 weeks, DFE, OCT, possible injection(s)  -- may be able to stop hold IVA OS soon   3. Vitreous Hemorrhage OD  - secondary to PDR as above  - s/p IVA OD #1 06.24.20, #2 (07.22.20),  #3 (08.25.20), #4 (09.28.20), #5 (10.26.20), #6 (12.09.20), #7 (01.13.21), #8 (02.24.21), #9 (04.07.21), #10 (05.05.21), #11 (06.02.21)  - s/p PRP OD fill in (08.06.20, 10.12.20)   - VH clearing and responding well to IVA  - recommend IVA OD #11 today, as above  - VH precautions reviewed -- minimize activities, keep head elevated, avoid ASA/NSAIDs/blood thinners as able   4,5. Hypertensive retinopathy OU  - discussed importance of tight BP control  - monitor  6. Pseudophakia OU  - s/p CE/IOL OD (4.9.21, Dr.  S. Groat)  - beautiful surgery, doing well  - monitor    Ophthalmic Meds Ordered this visit:  Meds ordered this encounter  Medications  . Bevacizumab (AVASTIN) SOLN 1.25 mg  . Bevacizumab (AVASTIN) SOLN 1.25 mg       Return in about 6 weeks (around 12/07/2019) for f/u PDR OU, DFE, OCT.  There are no Patient Instructions on file for this visit.   Explained the diagnoses, plan, and follow up with the patient and they expressed understanding.  Patient expressed understanding of the importance of proper follow up care.   This document serves as a record of services personally performed by Karie Chimera, MD, PhD. It was created on their behalf by Glee Arvin. Manson Passey, OA an ophthalmic technician.  The creation of this record is the provider's dictation and/or activities during the visit.    Electronically signed by: Glee Arvin. Manson Passey, [place] 08.10.2021 1:21 AM   Karie Chimera, M.D., Ph.D. Diseases & Surgery of the Retina and Vitreous [facility]  I have reviewed the above documentation for accuracy and completeness, and I agree with the above. Karie Chimera, M.D., Ph.D. 10/30/19 1:21 AM    Abbreviations: M myopia (nearsighted); A astigmatism; H hyperopia (farsighted); P presbyopia; Mrx spectacle prescription;  CTL contact lenses; OD right eye; OS left eye; OU both eyes  XT exotropia; ET esotropia; PEK punctate epithelial keratitis; PEE punctate epithelial erosions; DES dry eye syndrome; MGD meibomian gland dysfunction; ATs artificial tears; PFAT's preservative free artificial tears; NSC nuclear sclerotic cataract; PSC posterior subcapsular cataract; ERM epi-retinal membrane; PVD posterior vitreous detachment; RD retinal detachment; DM diabetes mellitus; DR diabetic retinopathy; NPDR non-proliferative diabetic retinopathy; PDR proliferative diabetic retinopathy; CSME clinically significant macular edema; DME diabetic macular edema; dbh dot blot hemorrhages; CWS cotton wool spot; POAG primary open angle glaucoma; C/D cup-to-disc ratio; HVF humphrey visual field; GVF goldmann visual field; OCT optical coherence tomography; IOP intraocular pressure; BRVO Branch retinal vein occlusion; CRVO central retinal vein occlusion; CRAO central retinal artery occlusion; BRAO branch retinal artery occlusion; RT retinal tear; SB scleral buckle; PPV pars plana vitrectomy; VH Vitreous hemorrhage; PRP panretinal laser photocoagulation; IVK intravitreal kenalog; VMT vitreomacular traction; MH Macular hole;  NVD neovascularization of the disc; NVE neovascularization elsewhere; AREDS age related eye disease study; ARMD age related macular degeneration; POAG primary open angle glaucoma; EBMD  epithelial/anterior basement membrane dystrophy; ACIOL anterior chamber intraocular lens; IOL intraocular lens; PCIOL posterior chamber intraocular lens; Phaco/IOL phacoemulsification with intraocular lens placement; PRK photorefractive keratectomy; LASIK laser assisted in situ keratomileusis; HTN hypertension; DM diabetes mellitus; COPD chronic obstructive pulmonary disease

## 2019-10-26 ENCOUNTER — Ambulatory Visit (INDEPENDENT_AMBULATORY_CARE_PROVIDER_SITE_OTHER): Payer: Medicaid Other | Admitting: Ophthalmology

## 2019-10-26 ENCOUNTER — Other Ambulatory Visit: Payer: Self-pay

## 2019-10-26 DIAGNOSIS — E113593 Type 2 diabetes mellitus with proliferative diabetic retinopathy without macular edema, bilateral: Secondary | ICD-10-CM | POA: Diagnosis not present

## 2019-10-26 DIAGNOSIS — H25812 Combined forms of age-related cataract, left eye: Secondary | ICD-10-CM

## 2019-10-26 DIAGNOSIS — H4311 Vitreous hemorrhage, right eye: Secondary | ICD-10-CM

## 2019-10-26 DIAGNOSIS — H35033 Hypertensive retinopathy, bilateral: Secondary | ICD-10-CM

## 2019-10-26 DIAGNOSIS — I10 Essential (primary) hypertension: Secondary | ICD-10-CM

## 2019-10-26 DIAGNOSIS — H3581 Retinal edema: Secondary | ICD-10-CM

## 2019-10-26 DIAGNOSIS — Z961 Presence of intraocular lens: Secondary | ICD-10-CM

## 2019-10-30 ENCOUNTER — Encounter (INDEPENDENT_AMBULATORY_CARE_PROVIDER_SITE_OTHER): Payer: Self-pay | Admitting: Ophthalmology

## 2019-10-30 MED ORDER — BEVACIZUMAB CHEMO INJECTION 1.25MG/0.05ML SYRINGE FOR KALEIDOSCOPE
1.2500 mg | INTRAVITREAL | Status: AC | PRN
  Administered 2019-10-30: 1.25 mg via INTRAVITREAL

## 2019-12-03 NOTE — Progress Notes (Signed)
[facility] - Clinic Note  12/07/2019     CHIEF COMPLAINT Patient presents for Retina Follow Up   HISTORY OF PRESENT ILLNESS: Anna Wagner is a 59 y.o. female who presents to the clinic today for:   HPI    Retina Follow Up    Patient presents with  Diabetic Retinopathy.  In both eyes.  Severity is moderate.  Duration of 6 weeks.  Since onset it is stable.  I, the attending physician,  performed the HPI with the patient and updated documentation appropriately.          Comments    Patient states vision fluctuates OU. Last a1c was around 7.5, checked last week.       Last edited by Rennis Chris, MD on 12/08/2019 11:09 PM. (History)    Patient states her vision fluctuates from seeing better to not seeing as well, she states her eyes sometimes feel "gummy", she is using Systane for dryness  Referring physician: Hoy Register, MD [address] [place],  [place] [address]  HISTORICAL INFORMATION:   Selected notes from the MEDICAL RECORD NUMBER Referred by [facility] Former pt of Dr. Scharlene Corn LEE: 11.15.19 (N. Marchase) [BCVA: OD: 20/60 OS: 20/30] Ocular Hx-NPDR OS, PDR OD, vitreous heme, cataracts OU (pt received IVL OU and PRP OU on 11.15.19)  PMH-DM (A1c: 10.1 05.01.19, takes Humulin and metformin), arthritis, depression, HTN, HLD, neuropathy   CURRENT MEDICATIONS: Current Outpatient Medications (Ophthalmic Drugs)  Medication Sig  . erythromycin ophthalmic ointment Place 1 application into both eyes as needed.  Marland Kitchen LOTEMAX 0.5 % GEL APPLY 1 DROP TO AFFECTED EYE 4 TIMES A DAY FOR 7 DAYS (Patient not taking: Reported on 09/21/2019)   No current facility-administered medications for this visit. (Ophthalmic Drugs)   Current Outpatient Medications (Other)  Medication Sig  . acetaminophen (TYLENOL) 500 MG tablet Take 1,000 mg by mouth every 6 (six) hours as needed for fever.  Marland Kitchen amitriptyline (ELAVIL) 100 MG tablet Take 1 tablet (100 mg  total) by mouth at bedtime.  Marland Kitchen aspirin EC 81 MG tablet Take 81 mg by mouth at bedtime.   Marland Kitchen atorvastatin (LIPITOR) 40 MG tablet Take 1 tablet (40 mg total) by mouth daily.  . cephALEXin (KEFLEX) 500 MG capsule Take 1 capsule (500 mg total) by mouth 2 (two) times daily.  . cetirizine (ZYRTEC) 10 MG tablet Take 1 tablet (10 mg total) by mouth daily.  . clotrimazole (LOTRIMIN) 1 % cream Apply 1 application topically 2 (two) times daily.  . Cyanocobalamin (VITAMIN B-12 PO) Take 1 tablet by mouth 2 (two) times daily.  . DULoxetine (CYMBALTA) 60 MG capsule Take 1 capsule (60 mg total) by mouth daily.  . furosemide (LASIX) 20 MG tablet TAKE 1 TABLET (20 MG TOTAL) BY MOUTH DAILY.  Marland Kitchen guaiFENesin (MUCINEX) 600 MG 12 hr tablet Take 1 tablet (600 mg total) by mouth 2 (two) times daily.  . insulin NPH-regular Human (HUMULIN 70/30) (70-30) 100 UNIT/ML injection Inject subcutaneously twice daily 55 units before breakfast and 60 units dinner  . Insulin Syringe-Needle U-100 (TRUEPLUS INSULIN SYRINGE) 30G X 5/16" 1 ML MISC Use as directed  . lisinopril (PRINIVIL,ZESTRIL) 20 MG tablet Take 1 tablet (20 mg total) by mouth daily.  Marland Kitchen LYRICA 100 MG capsule Take 1 capsule by mouth 3 times a day as directed by physician.  . metFORMIN (GLUCOPHAGE) 1000 MG tablet TAKE 1 TABLET BY MOUTH TWICE DAILY WITH A MEAL  . metoprolol  tartrate (LOPRESSOR) 50 MG tablet Take 1 tablet (50 mg total) by mouth 2 (two) times daily.  Marland Kitchen oxybutynin (DITROPAN XL) 10 MG 24 hr tablet Take 1 tablet (10 mg total) by mouth at bedtime.  Marland Kitchen oxyCODONE-acetaminophen (PERCOCET/ROXICET) 5-325 MG tablet Take 1 tablet by mouth every 4 (four) hours as needed for severe pain.  . silver sulfADIAZINE (SILVADENE) 1 % cream Apply 1 application topically 2 (two) times daily.  . traMADol (ULTRAM) 50 MG tablet Take 1 tablet (50 mg total) by mouth at bedtime.   No current facility-administered medications for this visit. (Other)      REVIEW OF SYSTEMS: ROS     Positive for: Neurological, Endocrine, Eyes, Psychiatric   Negative for: Constitutional, Gastrointestinal, Skin, Genitourinary, Musculoskeletal, HENT, Cardiovascular, Respiratory, Allergic/Imm, Heme/Lymph   Last edited by Annalee Genta D, COT on 12/07/2019  1:54 PM. (History)       ALLERGIES No Known Allergies  PAST MEDICAL HISTORY Past Medical History:  Diagnosis Date  . Bell's palsy 2005  . Cataract   . Diabetes mellitus without complication (HCC)   . Diabetic retinopathy (HCC)    PDR OU  . Hypertension   . Hypertensive retinopathy    OU  . Neuromuscular disorder (HCC)    NEUROPATHY  . Neuropathy   . Sepsis (HCC) 02/11/2016   Past Surgical History:  Procedure Laterality Date  . AMPUTATION Left 03/15/2014   Procedure: AMPUTATION TRANSMETATARSAL;  Surgeon: Nadara Mustard, MD;  Location: [facility] OR;  Service: Orthopedics;  Laterality: Left;  . CESAREAN SECTION    . CHOLECYSTECTOMY      FAMILY HISTORY Family History  Problem Relation Age of Onset  . Heart attack Mother   . Diabetes Father   . Diabetes Sister     SOCIAL HISTORY Social History   Tobacco Use  . Smoking status: Current Every Day Smoker    Packs/day: 1.00    Years: 38.00    Pack years: 38.00    Types: Cigarettes    Last attempt to quit: 09/19/2013    Years since quitting: 6.2  . Smokeless tobacco: Never Used  Substance Use Topics  . Alcohol use: No  . Drug use: Yes    Types: Marijuana         OPHTHALMIC EXAM:  Base Eye Exam    Visual Acuity (Snellen - Linear)      Right Left   Dist [place] 20/25 -2 20/30 +1   Dist ph [place] 20/25 +1 NI       Tonometry (Tonopen, 2:04 PM)      Right Left   Pressure 15 17       Pupils      Dark Light Shape React APD   Right 3 2 Round Brisk None   Left 3 2 Round Brisk None       Visual Fields (Counting fingers)      Left Right    Full Full       Extraocular Movement      Right Left    Full, Ortho Full, Ortho       Neuro/Psych    Oriented x3: Yes    Mood/Affect: Normal       Dilation    Both eyes: 1.0% Mydriacyl, 2.5% Phenylephrine @ 2:04 PM        Slit Lamp and Fundus Exam    Slit Lamp Exam      Right Left   Lids/Lashes Dermatochalasis - upper lid Dermatochalasis - upper lid  Conjunctiva/Sclera nasal and temporal Pinguecula nasal and temporal Pinguecula   Cornea 2+PEE, well healed temporal cataract wounds, Debris in tear film 2-3+PEE, well healed temporal cataract wounds   Anterior Chamber Deep and quiet Deep and quiet   Iris Round and moderately dilated, No NVI Round and dilated, No NVI   Lens PC IOL in good position, mild PC folds PC IOL in good position with PC folds   Vitreous Vitreous syneresis, +pigment in anterior vitreous, Posterior vitreous detachment, old white blood clots settled inferiorly, mild vitreous fibrosis scattered Vitreous syneresis, VH centrally resolved       Fundus Exam      Right Left   Disc Pink and Sharp, mild tilt, Compact Pink and Sharp, mild tilt, Compact   C/D Ratio 0.3 0.3   Macula Flat, Blunted foveal reflex, RPE mottling and clumping, Drusen, +cystic changes - slightly improved Flat, blunted foveal relfex, Retinal pigment epithelial mottling, Drusen, No heme or edema   Vessels Attenuated, tortuous Vascular attenuation, mild Tortuousity   Periphery Attached 360, inferior periphery obscured by old VH--white and improving, 360 PRP w/ room for posterior fill in if needed, scattered IRH, focal area of fibrosis inferior to disc with blood clot - clearing and improving Attached, good 360 PRP with good posterior fill in changes, trace sub hyaloid heme inferior to disc -- almost resolved          IMAGING AND PROCEDURES  Imaging and Procedures for @TODAY @  OCT, Retina - OU - Both Eyes       Right Eye Quality was borderline. Central Foveal Thickness: 252. Progression has improved. Findings include normal foveal contour, no SRF, epiretinal membrane, vitreous traction, outer retinal atrophy, no IRF  (Stable improvement in temporal IRF, interval release of vitreous traction from disc).   Left Eye Quality was good. Central Foveal Thickness: 252. Progression has improved. Findings include normal foveal contour, no IRF, no SRF, outer retinal atrophy, retinal drusen  (Mild interval improvement in vitreous opacities; partial PVD).   Notes *Images captured and stored on drive  Diagnosis / Impression:  OD: Mild interval improvement in temporal IRF;  interval release of vitreous traction from disc OS: interval improvement in vitreous opacities; partial PVD  Clinical management:  See below  Abbreviations: NFP - Normal foveal profile. CME - cystoid macular edema. PED - pigment epithelial detachment. IRF - intraretinal fluid. SRF - subretinal fluid. EZ - ellipsoid zone. ERM - epiretinal membrane. ORA - outer retinal atrophy. ORT - outer retinal tubulation. SRHM - subretinal hyper-reflective material                 ASSESSMENT/PLAN:    ICD-10-CM   1. 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 Retinal edema  H35.81 OCT, Retina - OU - Both Eyes  3. Vitreous hemorrhage of right eye (HCC)  H43.11   4. Essential hypertension  I10   5. Hypertensive retinopathy of both eyes  H35.033   6. Pseudophakia  Z96.1   7. 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 Proliferative diabetic retinopathy w/o DME, OU  - [facility]  - s/p PRP OU 11.15.19 -- had not been seen since then  - pt presented here 6.24.20 with progressive  vision loss OD since last visit with Dr. Robin Searing in 2019  - OD: diffuse VH  - OS: scattered flat NV  - OCT without diabetic macular edema, both eyes   - B-scan u/s 6.24.20 shows  diffuse VH OD; no RT/RD or mass  - s/p PRP OS (06.24.20), fill in (07.07.20)  - s/p PRP OD fill in (08.06.20), fill-in (10.12.20)  - s/p IVA OD #1 (06.24.20), #2 (07.22.20), #3 (08.25.20), #4 (09.28.20), #5 (10.26.20), #6 (12.09.20), #7 (01.13.21), #8 (02.24.21), #9 (04.07.21), #10 (05.05.21), #11 (06.02.21), #12 (07.09.21),#13 (08.13.21)  - s/p IVA OS #1 (02.24.21) -- for new subhyaloid heme/PDR, #2 (04.07.21), #3 (05.05.21), #4 (06.02.21), #5 (07.09.21),  #6 (08.13.21)  - FA (07.09.21) shows no NV OU, persistent late leaking MA's OU  - today, BCVA 20/25 OU  - VH essentially cleared OU, minimal cystic changes OU  - recommend holding IVA OU today, 09.24.21 -- pt in agreement  - IVA OU consent form signed and scanned on 02.24.21             - f/u 6 weeks, DFE, OCT, possible injection(s)   3. Vitreous Hemorrhage OD  - secondary to PDR as above  - s/p IVA OD #1 06.24.20, #2 (07.22.20),  #3 (08.25.20), #4 (09.28.20), #5 (10.26.20), #6 (12.09.20), #7 (01.13.21), #8 (02.24.21), #9 (04.07.21), #10 (05.05.21), #11 (06.02.21), #12 (07.09.21) #13 (08.13.21)  - s/p PRP OD fill in (08.06.20, 10.12.20)   - VH essentially cleared  - recommend holding IVA OD today, as above  - VH precautions reviewed -- minimize activities, keep head elevated, avoid ASA/NSAIDs/blood thinners as able   4,5. Hypertensive retinopathy OU  - discussed importance of tight BP control  - monitor  6. Pseudophakia OU  - s/p CE/IOL OD (4.9.21, Dr. Laruth Bouchard)  - beautiful surgery, doing well  - monitor    Ophthalmic Meds Ordered this visit:  Meds ordered this encounter  Medications  . erythromycin ophthalmic ointment    Sig: Place 1 application into both eyes as needed.    Dispense:  3.5 g    Refill:  1       Return in about 6 weeks (around 01/18/2020) for f/u PDR OU, DFE, OCT.  There are no Patient Instructions on file for this visit.   Explained the diagnoses, plan, and follow up with the patient and they  expressed understanding.  Patient expressed understanding of the importance of proper follow up care.   This document serves as a record of services personally performed by Karie Chimera, MD, PhD. It was created on their behalf by Glee Arvin. Manson Passey, OA an ophthalmic technician. The creation of this record is the provider's dictation and/or activities during the visit.    Electronically signed by: Glee Arvin. Manson Passey, [place] 09.20.2021 11:15 PM   Karie Chimera, M.D., Ph.D. Diseases & Surgery of the Retina and Vitreous [facility]  I have reviewed the above documentation for accuracy and completeness, and I agree with the above. Karie Chimera, M.D., Ph.D. 12/08/19 11:15 PM   Abbreviations: M myopia (nearsighted); A astigmatism; H hyperopia (farsighted); P presbyopia; Mrx spectacle prescription;  CTL contact lenses; OD right eye; OS left eye; OU both eyes  XT exotropia; ET esotropia; PEK punctate epithelial keratitis; PEE punctate epithelial erosions; DES dry eye syndrome; MGD meibomian gland dysfunction; ATs artificial tears; PFAT's preservative free artificial tears; NSC nuclear sclerotic cataract; PSC posterior subcapsular cataract; ERM epi-retinal membrane; PVD posterior vitreous detachment; RD retinal detachment; DM  diabetes mellitus; DR diabetic retinopathy; NPDR non-proliferative diabetic retinopathy; PDR proliferative diabetic retinopathy; CSME clinically significant macular edema; DME diabetic macular edema; dbh dot blot hemorrhages; CWS cotton wool spot; POAG primary open angle glaucoma; C/D cup-to-disc ratio; HVF humphrey visual field; GVF goldmann visual field; OCT optical coherence tomography; IOP intraocular pressure; BRVO Branch retinal vein occlusion; CRVO central retinal vein occlusion; CRAO central retinal artery occlusion; BRAO branch retinal artery occlusion; RT retinal tear; SB scleral buckle; PPV pars plana vitrectomy; VH Vitreous hemorrhage; PRP panretinal laser  photocoagulation; IVK intravitreal kenalog; VMT vitreomacular traction; MH Macular hole;  NVD neovascularization of the disc; NVE neovascularization elsewhere; AREDS age related eye disease study; ARMD age related macular degeneration; POAG primary open angle glaucoma; EBMD epithelial/anterior basement membrane dystrophy; ACIOL anterior chamber intraocular lens; IOL intraocular lens; PCIOL posterior chamber intraocular lens; Phaco/IOL phacoemulsification with intraocular lens placement; PRK photorefractive keratectomy; LASIK laser assisted in situ keratomileusis; HTN hypertension; DM diabetes mellitus; COPD chronic obstructive pulmonary disease

## 2019-12-07 ENCOUNTER — Other Ambulatory Visit: Payer: Self-pay

## 2019-12-07 ENCOUNTER — Encounter (INDEPENDENT_AMBULATORY_CARE_PROVIDER_SITE_OTHER): Payer: Self-pay | Admitting: Ophthalmology

## 2019-12-07 ENCOUNTER — Ambulatory Visit (INDEPENDENT_AMBULATORY_CARE_PROVIDER_SITE_OTHER): Payer: Medicaid Other | Admitting: Ophthalmology

## 2019-12-07 DIAGNOSIS — H35033 Hypertensive retinopathy, bilateral: Secondary | ICD-10-CM

## 2019-12-07 DIAGNOSIS — H3581 Retinal edema: Secondary | ICD-10-CM

## 2019-12-07 DIAGNOSIS — H4311 Vitreous hemorrhage, right eye: Secondary | ICD-10-CM

## 2019-12-07 DIAGNOSIS — I10 Essential (primary) hypertension: Secondary | ICD-10-CM | POA: Diagnosis not present

## 2019-12-07 DIAGNOSIS — E113593 Type 2 diabetes mellitus with proliferative diabetic retinopathy without macular edema, bilateral: Secondary | ICD-10-CM | POA: Diagnosis not present

## 2019-12-07 DIAGNOSIS — Z961 Presence of intraocular lens: Secondary | ICD-10-CM

## 2019-12-07 MED ORDER — ERYTHROMYCIN 5 MG/GM OP OINT: 1.0000 "application " | TOPICAL_OINTMENT | OPHTHALMIC | 1 refills | Status: DC | PRN

## 2019-12-08 ENCOUNTER — Encounter (INDEPENDENT_AMBULATORY_CARE_PROVIDER_SITE_OTHER): Payer: Self-pay | Admitting: Ophthalmology

## 2019-12-10 ENCOUNTER — Other Ambulatory Visit (INDEPENDENT_AMBULATORY_CARE_PROVIDER_SITE_OTHER): Payer: Self-pay

## 2019-12-10 MED ORDER — ERYTHROMYCIN 5 MG/GM OP OINT: 1.0000 "application " | TOPICAL_OINTMENT | OPHTHALMIC | 1 refills | Status: AC | PRN

## 2020-01-14 NOTE — Progress Notes (Signed)
[facility] - Clinic Note  01/18/2020     CHIEF COMPLAINT Patient presents for Retina Follow Up   HISTORY OF PRESENT ILLNESS: Anna MoccasinMarsha R Wagner is a 59 y.o. female who presents to the clinic today for:   HPI    Retina Follow Up    Patient presents with  Diabetic Retinopathy.  In both eyes.  I, the attending physician,  performed the HPI with the patient and updated documentation appropriately.          Comments    6 week PDR OU- Eyes feel "gummy around the lids"  Using Restasis TID OU and EES ung prn. At times vision is better than others.   BS 273 this am A1C: 7.4       Last edited by Rennis ChrisZamora, Diamante Truszkowski, MD on 01/18/2020  3:14 PM. (History)    Patient states her eyes still feel "gummy", she has switched from using Systane to using Restasis, she is using an ointment when the corners feel dry, she states sometimes her eyes seem more blurry than normal, but she can't attribute it to anything, pt states her blood sugar was high this morning bc she ate 4 hushpuppies last night  Referring physician: Hoy RegisterNewlin, Enobong, MD [address] [place],  [place] [id number]  HISTORICAL INFORMATION:   Selected notes from the MEDICAL RECORD NUMBER Referred by [facility] Former pt of Dr. Scharlene CornNicholas Marchase LEE: 11.15.19 (N. Marchase) [BCVA: OD: 20/60 OS: 20/30] Ocular Hx-NPDR OS, PDR OD, vitreous heme, cataracts OU (pt received IVL OU and PRP OU on 11.15.19)  PMH-DM (A1c: 10.1 05.01.19, takes Humulin and metformin), arthritis, depression, HTN, HLD, neuropathy   CURRENT MEDICATIONS: Current Outpatient Medications (Ophthalmic Drugs)  Medication Sig  . erythromycin ophthalmic ointment Place 1 application into both eyes as needed.  Marland Kitchen. LOTEMAX 0.5 % GEL APPLY 1 DROP TO AFFECTED EYE 4 TIMES A DAY FOR 7 DAYS (Patient not taking: Reported on 09/21/2019)   No current facility-administered medications for this visit. (Ophthalmic Drugs)   Current Outpatient Medications (Other)   Medication Sig  . acetaminophen (TYLENOL) 500 MG tablet Take 1,000 mg by mouth every 6 (six) hours as needed for fever.  Marland Kitchen. aspirin EC 81 MG tablet Take 81 mg by mouth at bedtime.   Marland Kitchen. atorvastatin (LIPITOR) 40 MG tablet Take 1 tablet (40 mg total) by mouth daily.  . DULoxetine (CYMBALTA) 60 MG capsule Take 1 capsule (60 mg total) by mouth daily.  . insulin glargine (SEMGLEE) 100 UNIT/ML injection Inject 95 Units into the skin daily.  Marland Kitchen. liraglutide (VICTOZA) 18 MG/3ML SOPN Inject 18 mg into the skin.  Marland Kitchen. LYRICA 100 MG capsule Take 1 capsule by mouth 3 times a day as directed by physician.  . metFORMIN (GLUCOPHAGE) 1000 MG tablet TAKE 1 TABLET BY MOUTH TWICE DAILY WITH A MEAL  . metoprolol tartrate (LOPRESSOR) 50 MG tablet Take 1 tablet (50 mg total) by mouth 2 (two) times daily.  . pregabalin (LYRICA) 100 MG capsule Take 100 mg by mouth in the morning, at noon, and at bedtime.  . traZODone (DESYREL) 100 MG tablet Take 100 mg by mouth 2 (two) times daily.  Marland Kitchen. amitriptyline (ELAVIL) 100 MG tablet Take 1 tablet (100 mg total) by mouth at bedtime. (Patient not taking: Reported on 01/18/2020)  . cephALEXin (KEFLEX) 500 MG capsule Take 1 capsule (500 mg total) by mouth 2 (two) times daily. (Patient not taking: Reported on 01/18/2020)  . cetirizine (ZYRTEC) 10 MG  tablet Take 1 tablet (10 mg total) by mouth daily. (Patient not taking: Reported on 01/18/2020)  . clotrimazole (LOTRIMIN) 1 % cream Apply 1 application topically 2 (two) times daily. (Patient not taking: Reported on 01/18/2020)  . Cyanocobalamin (VITAMIN B-12 PO) Take 1 tablet by mouth 2 (two) times daily.  . furosemide (LASIX) 20 MG tablet TAKE 1 TABLET (20 MG TOTAL) BY MOUTH DAILY. (Patient not taking: Reported on 01/18/2020)  . guaiFENesin (MUCINEX) 600 MG 12 hr tablet Take 1 tablet (600 mg total) by mouth 2 (two) times daily. (Patient not taking: Reported on 01/18/2020)  . insulin NPH-regular Human (HUMULIN 70/30) (70-30) 100 UNIT/ML injection  Inject subcutaneously twice daily 55 units before breakfast and 60 units dinner  . Insulin Syringe-Needle U-100 (TRUEPLUS INSULIN SYRINGE) 30G X 5/16" 1 ML MISC Use as directed  . lisinopril (PRINIVIL,ZESTRIL) 20 MG tablet Take 1 tablet (20 mg total) by mouth daily.  Marland Kitchen oxybutynin (DITROPAN XL) 10 MG 24 hr tablet Take 1 tablet (10 mg total) by mouth at bedtime. (Patient not taking: Reported on 01/18/2020)  . oxyCODONE-acetaminophen (PERCOCET/ROXICET) 5-325 MG tablet Take 1 tablet by mouth every 4 (four) hours as needed for severe pain. (Patient not taking: Reported on 01/18/2020)  . silver sulfADIAZINE (SILVADENE) 1 % cream Apply 1 application topically 2 (two) times daily. (Patient not taking: Reported on 01/18/2020)  . traMADol (ULTRAM) 50 MG tablet Take 1 tablet (50 mg total) by mouth at bedtime. (Patient not taking: Reported on 01/18/2020)   No current facility-administered medications for this visit. (Other)      REVIEW OF SYSTEMS: ROS    Positive for: Neurological, Musculoskeletal, Endocrine, Eyes, Psychiatric   Negative for: Constitutional, Gastrointestinal, Skin, Genitourinary, HENT, Cardiovascular, Respiratory, Allergic/Imm, Heme/Lymph   Last edited by Joni Reining, COA on 01/18/2020  1:15 PM. (History)       ALLERGIES No Known Allergies  PAST MEDICAL HISTORY Past Medical History:  Diagnosis Date  . Bell's palsy 2005  . Cataract   . Diabetes mellitus without complication (HCC)   . Diabetic retinopathy (HCC)    PDR OU  . Hypertension   . Hypertensive retinopathy    OU  . Neuromuscular disorder (HCC)    NEUROPATHY  . Neuropathy   . Sepsis (HCC) 02/11/2016   Past Surgical History:  Procedure Laterality Date  . AMPUTATION Left 03/15/2014   Procedure: AMPUTATION TRANSMETATARSAL;  Surgeon: Nadara Mustard, MD;  Location: [facility];  Service: Orthopedics;  Laterality: Left;  . CESAREAN SECTION    . CHOLECYSTECTOMY      FAMILY HISTORY Family History  Problem Relation Age of  Onset  . Heart attack Mother   . Diabetes Father   . Diabetes Sister     SOCIAL HISTORY Social History   Tobacco Use  . Smoking status: Current Every Day Smoker    Packs/day: 1.00    Years: 38.00    Pack years: 38.00    Types: Cigarettes    Last attempt to quit: 09/19/2013    Years since quitting: 6.3  . Smokeless tobacco: Never Used  Substance Use Topics  . Alcohol use: No  . Drug use: Yes    Types: Marijuana         OPHTHALMIC EXAM:  Base Eye Exam    Visual Acuity (Snellen - Linear)      Right Left   Dist [place] 20/25 20/30   Dist ph [place] 20/25 +1 NI   Correction: Glasses       Tonometry (Tonopen,  1:26 PM)      Right Left   Pressure 15 15       Pupils      Dark Light Shape React APD   Right 3 2 Round Brisk None   Left 3 2 Round Brisk None       Visual Fields (Counting fingers)      Left Right    Full Full       Extraocular Movement      Right Left    Full Full       Neuro/Psych    Oriented x3: Yes   Mood/Affect: Normal       Dilation    Both eyes: 1.0% Mydriacyl, 2.5% Phenylephrine @ 1:26 PM        Slit Lamp and Fundus Exam    Slit Lamp Exam      Right Left   Lids/Lashes Dermatochalasis - upper lid Dermatochalasis - upper lid   Conjunctiva/Sclera nasal and temporal Pinguecula nasal and temporal Pinguecula   Cornea 2+PEE, well healed temporal cataract wounds, Debris in tear film 2-3+PEE, well healed temporal cataract wounds   Anterior Chamber Deep and quiet Deep and quiet   Iris Round and moderately dilated, No NVI Round and dilated, No NVI   Lens PC IOL in good position, mild PC folds PC IOL in good position with PC folds   Vitreous Vitreous syneresis, Posterior vitreous detachment, mild vitreous fibrosis, vitreous condensations Vitreous syneresis, VH centrally resolved       Fundus Exam      Right Left   Disc Pink and Sharp, mild tilt, Compact Pink and Sharp, mild tilt, Compact   C/D Ratio 0.3 0.3   Macula Flat, Blunted foveal reflex, RPE  mottling and clumping, Drusen, trace cystic changes - slightly increased Flat, good foveal relfex, mild Retinal pigment epithelial mottling, Drusen, No heme or edema   Vessels Attenuated, tortuous Vascular attenuation, mild Tortuousity   Periphery Attached 360, inferior periphery obscured by old VH--white and improving, 360 PRP w/ room for posterior fill in if needed, scattered IRH, focal area of fibrosis inferior to disc with blood clot - clearing and improving, rare MA Attached, good 360 PRP with good posterior fill in changes, rare MA          IMAGING AND PROCEDURES  Imaging and Procedures for @  OCT, Retina - OU - Both Eyes       Right Eye Quality was borderline. Central Foveal Thickness: 273. Progression has worsened. Findings include normal foveal contour, no SRF, epiretinal membrane, vitreous traction, outer retinal atrophy, no IRF (Mild interval increase in trace cystic changes; +vitreous opacities).   Left Eye Quality was good. Central Foveal Thickness: 254. Progression has been stable. Findings include normal foveal contour, no IRF, no SRF, outer retinal atrophy, retinal drusen  (stable resolution of vitreous opacities; partial PVD).   Notes *Images captured and stored on drive  Diagnosis / Impression:  OD: Mild interval increase in trace cystic changes; +vitreous opacities OS: stable resolution of vitreous opacities; partial PVD  Clinical management:  See below  Abbreviations: NFP - Normal foveal profile. CME - cystoid macular edema. PED - pigment epithelial detachment. IRF - intraretinal fluid. SRF - subretinal fluid. EZ - ellipsoid zone. ERM - epiretinal membrane. ORA - outer retinal atrophy. ORT - outer retinal tubulation. SRHM - subretinal hyper-reflective material        Intravitreal Injection, Pharmacologic Agent - OD - Right Eye       Time Out 01/18/2020.  1:49 PM. Confirmed correct patient, procedure, site, and patient consented.   Anesthesia Topical  anesthesia was used. Anesthetic medications included Lidocaine 2%, Proparacaine 0.5%.   Procedure Preparation included 5% betadine to ocular surface, eyelid speculum. A supplied needle was used.   Injection:  1.25 mg Bevacizumab (AVASTIN) SOLN   NDC: 51025-852-77, Lot: [id number] , Expiration date: 03/11/2020   Route: Intravitreal, Site: Right Eye, Waste: 0 mL  Post-op Post injection exam found visual acuity of at least counting fingers. The patient tolerated the procedure well. There were no complications. The patient received written and verbal post procedure care education.                 ASSESSMENT/PLAN:    ICD-10-CM   1. 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 Retinal edema  H35.81 OCT, Retina - OU - Both Eyes  3. Vitreous hemorrhage of right eye (HCC)  H43.11   4. Essential hypertension  I10   5. Hypertensive retinopathy of both eyes  H35.033   6. Pseudophakia of both eyes  Z96.1     1,2. Proliferative diabetic retinopathy w/o DME, OU  - formerly pt of Dr. 07-06-2001 at [facility]  - s/p PRP OU 11.15.19 -- had not been seen since then  - pt presented here 6.24.20 with progressive vision loss OD since last visit with Dr. 08-29-2003 in 2019  - OD: diffuse VH  - OS: scattered flat NV  - B-scan u/s 6.24.20 shows diffuse VH OD; no RT/RD or mass  - s/p PRP OS (06.24.20), fill in (07.07.20)  - s/p PRP OD fill in (08.06.20), fill-in (10.12.20)  - s/p IVA OD #1 (06.24.20), #2 (07.22.20), #3 (08.25.20), #4 (09.28.20), #5 (10.26.20), #6 (12.09.20), #7 (01.13.21), #8 (02.24.21), #9 (04.07.21), #10 (05.05.21), #11 (06.02.21), #12 (07.09.21),#13 (08.13.21)  - s/p IVA OS #1 (02.24.21) -- for new subhyaloid heme/PDR, #2 (04.07.21), #3 (05.05.21), #4 (06.02.21), #5 (07.09.21),  #6  (08.13.21)  - FA (07.09.21) shows no NV OU, persistent late leaking MA's OU  - today, BCVA 20/25 OU  - OCT shows mild interval increase in cystic changes OD  - recommend IVA OD #14 today, 11.05.21  - pt wishes to proceed  - RBA of procedure discussed, questions answered  - informed consent obtained and signed  - see procedure note  - VH essentially cleared OU, minimal cystic changes OU  - recommend holding IVA OS today, 11.05.21 -- pt in agreement  - IVA OU consent form signed and scanned on 02.24.21             - f/u 8 weeks, DFE, OCT, possible injection(s)   3. Vitreous Hemorrhage OD  - secondary to PDR as above  - s/p IVA OD #1 06.24.20, #2 (07.22.20),  #3 (08.25.20), #4 (09.28.20), #5 (10.26.20), #6 (12.09.20), #7 (01.13.21), #8 (02.24.21), #9 (04.07.21), #10 (05.05.21), #11 (06.02.21), #12 (07.09.21) #13 (08.13.21)  - s/p PRP OD fill in (08.06.20, 10.12.20)   - VH essentially cleared clinically, but vitreous opacities persist on OCT  - recommend IVA OD #14 today, as above  - VH precautions reviewed -- minimize activities, keep head elevated, avoid ASA/NSAIDs/blood thinners as able   4,5. Hypertensive retinopathy OU  - discussed importance of tight BP control  - monitor  6. Pseudophakia OU  - s/p CE/IOL OD (4.9.21, Dr. 6.9.21.  Groat)  - beautiful surgery, doing well  - monitor    Ophthalmic Meds Ordered this visit:  Meds ordered this encounter  Medications  . Bevacizumab (AVASTIN) SOLN 1.25 mg       Return in about 8 weeks (around 03/14/2020) for f/u PDR OU, DFE, OCT.  There are no Patient Instructions on file for this visit.   Explained the diagnoses, plan, and follow up with the patient and they expressed understanding.  Patient expressed understanding of the importance of proper follow up care.   This document serves as a record of services personally performed by Karie Chimera, MD, PhD. It was created on their behalf by Glee Arvin. Manson Passey, OA an ophthalmic  technician. The creation of this record is the provider's dictation and/or activities during the visit.    Electronically signed by: Glee Arvin. Manson Passey, [place] 11.01.2021 3:19 PM   Karie Chimera, M.D., Ph.D. Diseases & Surgery of the Retina and Vitreous [facility]  I have reviewed the above documentation for accuracy and completeness, and I agree with the above. Karie Chimera, M.D., Ph.D. 01/18/20 3:19 PM   Abbreviations: M myopia (nearsighted); A astigmatism; H hyperopia (farsighted); P presbyopia; Mrx spectacle prescription;  CTL contact lenses; OD right eye; OS left eye; OU both eyes  XT exotropia; ET esotropia; PEK punctate epithelial keratitis; PEE punctate epithelial erosions; DES dry eye syndrome; MGD meibomian gland dysfunction; ATs artificial tears; PFAT's preservative free artificial tears; NSC nuclear sclerotic cataract; PSC posterior subcapsular cataract; ERM epi-retinal membrane; PVD posterior vitreous detachment; RD retinal detachment; DM diabetes mellitus; DR diabetic retinopathy; NPDR non-proliferative diabetic retinopathy; PDR proliferative diabetic retinopathy; CSME clinically significant macular edema; DME diabetic macular edema; dbh dot blot hemorrhages; CWS cotton wool spot; POAG primary open angle glaucoma; C/D cup-to-disc ratio; HVF humphrey visual field; GVF goldmann visual field; OCT optical coherence tomography; IOP intraocular pressure; BRVO Branch retinal vein occlusion; CRVO central retinal vein occlusion; CRAO central retinal artery occlusion; BRAO branch retinal artery occlusion; RT retinal tear; SB scleral buckle; PPV pars plana vitrectomy; VH Vitreous hemorrhage; PRP panretinal laser photocoagulation; IVK intravitreal kenalog; VMT vitreomacular traction; MH Macular hole;  NVD neovascularization of the disc; NVE neovascularization elsewhere; AREDS age related eye disease study; ARMD age related macular degeneration; POAG primary open angle glaucoma;  EBMD epithelial/anterior basement membrane dystrophy; ACIOL anterior chamber intraocular lens; IOL intraocular lens; PCIOL posterior chamber intraocular lens; Phaco/IOL phacoemulsification with intraocular lens placement; PRK photorefractive keratectomy; LASIK laser assisted in situ keratomileusis; HTN hypertension; DM diabetes mellitus; COPD chronic obstructive pulmonary disease

## 2020-01-18 ENCOUNTER — Ambulatory Visit (INDEPENDENT_AMBULATORY_CARE_PROVIDER_SITE_OTHER): Payer: Medicaid Other | Admitting: Ophthalmology

## 2020-01-18 ENCOUNTER — Encounter (INDEPENDENT_AMBULATORY_CARE_PROVIDER_SITE_OTHER): Payer: Self-pay | Admitting: Ophthalmology

## 2020-01-18 ENCOUNTER — Other Ambulatory Visit: Payer: Self-pay

## 2020-01-18 DIAGNOSIS — H3581 Retinal edema: Secondary | ICD-10-CM

## 2020-01-18 DIAGNOSIS — E113593 Type 2 diabetes mellitus with proliferative diabetic retinopathy without macular edema, bilateral: Secondary | ICD-10-CM

## 2020-01-18 DIAGNOSIS — H25812 Combined forms of age-related cataract, left eye: Secondary | ICD-10-CM

## 2020-01-18 DIAGNOSIS — Z961 Presence of intraocular lens: Secondary | ICD-10-CM

## 2020-01-18 DIAGNOSIS — H4311 Vitreous hemorrhage, right eye: Secondary | ICD-10-CM | POA: Diagnosis not present

## 2020-01-18 DIAGNOSIS — H35033 Hypertensive retinopathy, bilateral: Secondary | ICD-10-CM

## 2020-01-18 DIAGNOSIS — I10 Essential (primary) hypertension: Secondary | ICD-10-CM

## 2020-01-18 MED ORDER — BEVACIZUMAB CHEMO INJECTION 1.25MG/0.05ML SYRINGE FOR KALEIDOSCOPE
1.2500 mg | INTRAVITREAL | Status: AC | PRN
  Administered 2020-01-18: 1.25 mg via INTRAVITREAL

## 2020-03-14 ENCOUNTER — Encounter (INDEPENDENT_AMBULATORY_CARE_PROVIDER_SITE_OTHER): Payer: Medicaid Other | Admitting: Ophthalmology

## 2020-03-18 ENCOUNTER — Encounter (INDEPENDENT_AMBULATORY_CARE_PROVIDER_SITE_OTHER): Payer: Medicaid Other | Admitting: Ophthalmology

## 2020-03-20 ENCOUNTER — Encounter (INDEPENDENT_AMBULATORY_CARE_PROVIDER_SITE_OTHER): Payer: Medicaid Other | Admitting: Ophthalmology

## 2020-04-01 NOTE — Progress Notes (Addendum)
[facility] - Clinic Note  04/02/2020     CHIEF COMPLAINT Patient presents for Retina Follow Up   HISTORY OF PRESENT ILLNESS: Anna Wagner is a 60 y.o. female who presents to the clinic today for:   HPI    Retina Follow Up    Patient presents with  Diabetic Retinopathy.  In both eyes.  Severity is moderate.  Duration of 8 weeks.  Since onset it is gradually worsening.  I, the attending physician,  performed the HPI with the patient and updated documentation appropriately.          Comments    Pt states vision is blurry in both eyes, she saw Dr. Dione Booze this morning who gave her Benay Spice for dry eyes, she uses Systane QID OU, no new floaters or fol, pt has not checked blood sugar today       Last edited by Rennis Chris, MD on 04/02/2020  9:27 PM. (History)    Patient states   Referring physician: [facility] [address] [place],  [place] [address]  HISTORICAL INFORMATION:   Selected notes from the MEDICAL RECORD NUMBER Referred by [facility] Former pt of Dr. Scharlene Corn LEE: 11.15.19 (N. Marchase) [BCVA: OD: 20/60 OS: 20/30] Ocular Hx-NPDR OS, PDR OD, vitreous heme, cataracts OU (pt received IVL OU and PRP OU on 11.15.19)  PMH-DM (A1c: 10.1 05.01.19, takes Humulin and metformin), arthritis, depression, HTN, HLD, neuropathy   CURRENT MEDICATIONS: Current Outpatient Medications (Ophthalmic Drugs)  Medication Sig  . erythromycin ophthalmic ointment Place 1 application into both eyes as needed.  Marland Kitchen LOTEMAX 0.5 % GEL APPLY 1 DROP TO AFFECTED EYE 4 TIMES A DAY FOR 7 DAYS (Patient not taking: Reported on 09/21/2019)   No current facility-administered medications for this visit. (Ophthalmic Drugs)   Current Outpatient Medications (Other)  Medication Sig  . acetaminophen (TYLENOL) 500 MG tablet Take 1,000 mg by mouth every 6 (six) hours as needed for fever.  Marland Kitchen amitriptyline (ELAVIL) 100 MG tablet Take 1 tablet (100 mg  total) by mouth at bedtime. (Patient not taking: Reported on 01/18/2020)  . aspirin EC 81 MG tablet Take 81 mg by mouth at bedtime.   Marland Kitchen atorvastatin (LIPITOR) 40 MG tablet Take 1 tablet (40 mg total) by mouth daily.  . cephALEXin (KEFLEX) 500 MG capsule Take 1 capsule (500 mg total) by mouth 2 (two) times daily. (Patient not taking: Reported on 01/18/2020)  . cetirizine (ZYRTEC) 10 MG tablet Take 1 tablet (10 mg total) by mouth daily. (Patient not taking: Reported on 01/18/2020)  . clotrimazole (LOTRIMIN) 1 % cream Apply 1 application topically 2 (two) times daily. (Patient not taking: Reported on 01/18/2020)  . Cyanocobalamin (VITAMIN B-12 PO) Take 1 tablet by mouth 2 (two) times daily.  . DULoxetine (CYMBALTA) 60 MG capsule Take 1 capsule (60 mg total) by mouth daily.  . furosemide (LASIX) 20 MG tablet TAKE 1 TABLET (20 MG TOTAL) BY MOUTH DAILY. (Patient not taking: Reported on 01/18/2020)  . guaiFENesin (MUCINEX) 600 MG 12 hr tablet Take 1 tablet (600 mg total) by mouth 2 (two) times daily. (Patient not taking: Reported on 01/18/2020)  . insulin glargine (SEMGLEE) 100 UNIT/ML injection Inject 95 Units into the skin daily.  . insulin NPH-regular Human (HUMULIN 70/30) (70-30) 100 UNIT/ML injection Inject subcutaneously twice daily 55 units before breakfast and 60 units dinner  . Insulin Syringe-Needle U-100 (TRUEPLUS INSULIN SYRINGE) 30G X 5/16" 1 ML  MISC Use as directed  . liraglutide (VICTOZA) 18 MG/3ML SOPN Inject 18 mg into the skin.  Marland Kitchen. lisinopril (PRINIVIL,ZESTRIL) 20 MG tablet Take 1 tablet (20 mg total) by mouth daily.  Marland Kitchen. LYRICA 100 MG capsule Take 1 capsule by mouth 3 times a day as directed by physician.  . metFORMIN (GLUCOPHAGE) 1000 MG tablet TAKE 1 TABLET BY MOUTH TWICE DAILY WITH A MEAL  . metoprolol tartrate (LOPRESSOR) 50 MG tablet Take 1 tablet (50 mg total) by mouth 2 (two) times daily.  Marland Kitchen. oxybutynin (DITROPAN XL) 10 MG 24 hr tablet Take 1 tablet (10 mg total) by mouth at bedtime.  (Patient not taking: Reported on 01/18/2020)  . oxyCODONE-acetaminophen (PERCOCET/ROXICET) 5-325 MG tablet Take 1 tablet by mouth every 4 (four) hours as needed for severe pain. (Patient not taking: Reported on 01/18/2020)  . pregabalin (LYRICA) 100 MG capsule Take 100 mg by mouth in the morning, at noon, and at bedtime.  . silver sulfADIAZINE (SILVADENE) 1 % cream Apply 1 application topically 2 (two) times daily. (Patient not taking: Reported on 01/18/2020)  . traMADol (ULTRAM) 50 MG tablet Take 1 tablet (50 mg total) by mouth at bedtime. (Patient not taking: Reported on 01/18/2020)  . traZODone (DESYREL) 100 MG tablet Take 100 mg by mouth 2 (two) times daily.   No current facility-administered medications for this visit. (Other)      REVIEW OF SYSTEMS: ROS    Positive for: Endocrine, Cardiovascular, Eyes   Negative for: Constitutional, Gastrointestinal, Neurological, Skin, Genitourinary, Musculoskeletal, HENT, Respiratory, Psychiatric, Allergic/Imm, Heme/Lymph   Last edited by Posey BoyerBrown, Amanda J, COT on 04/02/2020  1:14 PM. (History)       ALLERGIES No Known Allergies  PAST MEDICAL HISTORY Past Medical History:  Diagnosis Date  . Bell's palsy 2005  . Cataract   . Diabetes mellitus without complication (HCC)   . Diabetic retinopathy (HCC)    PDR OU  . Hypertension   . Hypertensive retinopathy    OU  . Neuromuscular disorder (HCC)    NEUROPATHY  . Neuropathy   . Sepsis (HCC) 02/11/2016   Past Surgical History:  Procedure Laterality Date  . AMPUTATION Left 03/15/2014   Procedure: AMPUTATION TRANSMETATARSAL;  Surgeon: Nadara MustardMarcus Duda V, MD;  [facility] OR;  Service: Orthopedics;  Laterality: Left;  . CESAREAN SECTION    . CHOLECYSTECTOMY      FAMILY HISTORY Family History  Problem Relation Age of Onset  . Heart attack Mother   . Diabetes Father   . Diabetes Sister     SOCIAL HISTORY Social History   Tobacco Use  . Smoking status: Current Every Day Smoker    Packs/day:  1.00    Years: 38.00    Pack years: 38.00    Types: Cigarettes    Last attempt to quit: 09/19/2013    Years since quitting: 6.5  . Smokeless tobacco: Never Used  Substance Use Topics  . Alcohol use: No  . Drug use: Yes    Types: Marijuana         OPHTHALMIC EXAM:  Base Eye Exam    Visual Acuity (Snellen - Linear)      Right Left   Dist [place] 20/40 +2 20/40 +1   Dist ph [place] NI 20/30 -1       Tonometry (Tonopen, 1:21 PM)      Right Left   Pressure 21 23       Tonometry #2 (Tonopen, 1:21 PM)      Right Left  Pressure 21 23       Pupils      Dark React   Right 6 dilated   Left 6 dilated       Visual Fields (Counting fingers)      Left Right    Full Full       Extraocular Movement      Right Left    Full, Ortho Full, Ortho       Neuro/Psych    Oriented x3: Yes   Mood/Affect: Normal        Slit Lamp and Fundus Exam    Slit Lamp Exam      Right Left   Lids/Lashes Dermatochalasis - upper lid Dermatochalasis - upper lid   Conjunctiva/Sclera nasal and temporal Pinguecula nasal and temporal Pinguecula   Cornea 3+PEE, well healed temporal cataract wounds, Debris in tear film 2-3+PEE, well healed temporal cataract wounds   Anterior Chamber Deep and quiet Deep and quiet   Iris Round and moderately dilated, No NVI Round and dilated, No NVI   Lens PC IOL in good position, mild PC folds PC IOL in good position with PC folds   Vitreous Vitreous syneresis, Posterior vitreous detachment, mild vitreous fibrosis, vitreous condensations Vitreous syneresis, VH centrally resolved       Fundus Exam      Right Left   Disc Pink and Sharp, mild tilt, Compact Pink and Sharp, mild tilt, Compact   C/D Ratio 0.3 0.3   Macula Flat, Blunted foveal reflex, RPE mottling and clumping, Drusen, trace cystic changes - persistent Flat, good foveal relfex, mild Retinal pigment epithelial mottling, Drusen, No edema, focal IRH inferior macula   Vessels Attenuated, tortuous Vascular  attenuation, mild Tortuousity   Periphery Attached 360, 360 PRP w/ room for posterior fill in if needed, scattered IRH, focal area of fibrosis inferior to disc with blood clot - clearing and improving, rare MA Attached, good 360 PRP with good posterior fill in changes, rare MA          IMAGING AND PROCEDURES  Imaging and Procedures for @TODAY @  OCT, Retina - OU - Both Eyes       Right Eye Quality was borderline. Central Foveal Thickness: 273. Progression has worsened. Findings include normal foveal contour, no SRF, epiretinal membrane, vitreous traction, outer retinal atrophy, no IRF (Mild interval increase in temporal cystic changes; +vitreous opacities).   Left Eye Quality was good. Central Foveal Thickness: 257. Progression has been stable. Findings include normal foveal contour, no IRF, no SRF, outer retinal atrophy, retinal drusen  (stable resolution of vitreous opacities; partial PVD).   Notes *Images captured and stored on drive  Diagnosis / Impression:  OD: Mild interval increase in temporal cystic changes; +vitreous opacities OS: stable resolution of vitreous opacities; partial PVD  Clinical management:  See below  Abbreviations: NFP - Normal foveal profile. CME - cystoid macular edema. PED - pigment epithelial detachment. IRF - intraretinal fluid. SRF - subretinal fluid. EZ - ellipsoid zone. ERM - epiretinal membrane. ORA - outer retinal atrophy. ORT - outer retinal tubulation. SRHM - subretinal hyper-reflective material        Intravitreal Injection, Pharmacologic Agent - OD - Right Eye       Time Out 04/02/2020. 1:47 PM. Confirmed correct patient, procedure, site, and patient consented.   Anesthesia Topical anesthesia was used. Anesthetic medications included Lidocaine 2%, Proparacaine 0.5%.   Procedure Preparation included 5% betadine to ocular surface, eyelid speculum. A supplied needle was used.   Injection:  1.25 mg Bevacizumab (AVASTIN) 1.25mg /0.41mL  SOLN   NDC: 17408-144-81, Lot: [id number] , Expiration date: 04/23/2020   Route: Intravitreal, Site: Right Eye, Waste: 0 mL  Post-op Post injection exam found visual acuity of at least counting fingers. The patient tolerated the procedure well. There were no complications. The patient received written and verbal post procedure care education.                 ASSESSMENT/PLAN:    ICD-10-CM   1. 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 Retinal edema  H35.81 OCT, Retina - OU - Both Eyes  3. Vitreous hemorrhage of right eye (HCC)  H43.11   4. Essential hypertension  I10   5. Hypertensive retinopathy of both eyes  H35.033   6. Pseudophakia of both eyes  Z96.1     1,2. Proliferative diabetic retinopathy w/o DME, OU  - delayed follow up from 8 weeks to 10.5 weeks  - formerly pt of Dr. Robin Searing at [facility]  - s/p PRP OU 11.15.19 -- had not been seen since then  - pt presented here 6.24.20 with progressive vision loss OD since last visit with Dr. Robin Searing in 2019  - OD: diffuse VH  - OS: scattered flat NV  - B-scan u/s 6.24.20 shows diffuse VH OD; no RT/RD or mass  - s/p PRP OS (06.24.20), fill in (07.07.20)  - s/p PRP OD fill in (08.06.20), fill-in (10.12.20)  - s/p IVA OD #1 (06.24.20), #2 (07.22.20), #3 (08.25.20), #4 (09.28.20), #5 (10.26.20), #6 (12.09.20), #7 (01.13.21), #8 (02.24.21), #9 (04.07.21), #10 (05.05.21), #11 (06.02.21), #12 (07.09.21),#13 (08.13.21), #14 (11.05.21)  - s/p IVA OS #1 (02.24.21) -- for new subhyaloid heme/PDR, #2 (04.07.21), #3 (05.05.21), #4 (06.02.21), #5 (07.09.21), #6 (08.13.21)  - FA (07.09.21) shows no NV OU, persistent late leaking MA's OU  - today, BCVA OD: 20/40++ (decreased); OS: 20/30- (stable)  - OCT shows mild interval increase in cystic changes  OD  - recommend IVA OD #15 today, 01.19.22  - pt wishes to proceed  - RBA of procedure discussed, questions answered  - informed consent obtained and signed  - see procedure note  - VH essentially cleared OU, minimal cystic changes OS  - recommend holding IVA OS today -- pt in agreement  - IVA OU consent form signed and scanned on 02.24.21             - f/u 8 weeks, DFE, OCT, possible injection(s)   3. Vitreous Hemorrhage OD  - secondary to PDR as above  - s/p IVA OD #1 06.24.20, #2 (07.22.20),  #3 (08.25.20), #4 (09.28.20), #5 (10.26.20), #6 (12.09.20), #7 (01.13.21), #8 (02.24.21), #9 (04.07.21), #10 (05.05.21), #11 (06.02.21), #12 (07.09.21) #13 (08.13.21), #14 (11.05.21)  - s/p PRP OD fill in (08.06.20, 10.12.20)   - VH essentially cleared clinically, but vitreous opacities persist on OCT  - recommend IVA OD #15 today, as above   4,5. Hypertensive retinopathy OU  - discussed importance of tight BP control  - monitor  6. Pseudophakia OU  - s/p CE/IOL OD (4.9.21, Dr. Laruth Bouchard)  - beautiful surgery, doing well  - monitor    Ophthalmic Meds Ordered this visit:  Meds ordered this encounter  Medications  . Bevacizumab (AVASTIN) SOLN 1.25 mg       Return in about 8 weeks (  around 05/28/2020) for PDR, DME - Dilated Exam, OCT, Possible Injxn.  There are no Patient Instructions on file for this visit.   Explained the diagnoses, plan, and follow up with the patient and they expressed understanding.  Patient expressed understanding of the importance of proper follow up care.   This document serves as a record of services personally performed by Karie ChimeraBrian G. Katleen Carraway, MD, PhD. It was created on their behalf by Annalee Gentaaryl Barber, COMT. The creation of this record is the provider's dictation and/or activities during the visit.  Electronically signed by: Annalee Gentaaryl Barber, COMT 04/02/20 9:45 PM   This document serves as a record of services personally performed by Karie ChimeraBrian G. Wetzel Meester, MD, PhD. It was  created on their behalf by Glee ArvinAmanda J. Manson PasseyBrown, OA an ophthalmic technician. The creation of this record is the provider's dictation and/or activities during the visit.    Electronically signed by: Glee ArvinAmanda J. Manson PasseyBrown, [place] 01.19.2022 9:45 PM  Karie ChimeraBrian G. Sayre Witherington, M.D., Ph.D. Diseases & Surgery of the Retina and Vitreous [facility]  I have reviewed the above documentation for accuracy and completeness, and I agree with the above. Karie ChimeraBrian G. Vilma Will, M.D., Ph.D. 04/02/20 9:45 PM  Abbreviations: M myopia (nearsighted); A astigmatism; H hyperopia (farsighted); P presbyopia; Mrx spectacle prescription;  CTL contact lenses; OD right eye; OS left eye; OU both eyes  XT exotropia; ET esotropia; PEK punctate epithelial keratitis; PEE punctate epithelial erosions; DES dry eye syndrome; MGD meibomian gland dysfunction; ATs artificial tears; PFAT's preservative free artificial tears; NSC nuclear sclerotic cataract; PSC posterior subcapsular cataract; ERM epi-retinal membrane; PVD posterior vitreous detachment; RD retinal detachment; DM diabetes mellitus; DR diabetic retinopathy; NPDR non-proliferative diabetic retinopathy; PDR proliferative diabetic retinopathy; CSME clinically significant macular edema; DME diabetic macular edema; dbh dot blot hemorrhages; CWS cotton wool spot; POAG primary open angle glaucoma; C/D cup-to-disc ratio; HVF humphrey visual field; GVF goldmann visual field; OCT optical coherence tomography; IOP intraocular pressure; BRVO Branch retinal vein occlusion; CRVO central retinal vein occlusion; CRAO central retinal artery occlusion; BRAO branch retinal artery occlusion; RT retinal tear; SB scleral buckle; PPV pars plana vitrectomy; VH Vitreous hemorrhage; PRP panretinal laser photocoagulation; IVK intravitreal kenalog; VMT vitreomacular traction; MH Macular hole;  NVD neovascularization of the disc; NVE neovascularization elsewhere; AREDS age related eye disease study; ARMD age related  macular degeneration; POAG primary open angle glaucoma; EBMD epithelial/anterior basement membrane dystrophy; ACIOL anterior chamber intraocular lens; IOL intraocular lens; PCIOL posterior chamber intraocular lens; Phaco/IOL phacoemulsification with intraocular lens placement; PRK photorefractive keratectomy; LASIK laser assisted in situ keratomileusis; HTN hypertension; DM diabetes mellitus; COPD chronic obstructive pulmonary disease

## 2020-04-02 ENCOUNTER — Ambulatory Visit (INDEPENDENT_AMBULATORY_CARE_PROVIDER_SITE_OTHER): Payer: Medicaid Other | Admitting: Ophthalmology

## 2020-04-02 ENCOUNTER — Encounter (INDEPENDENT_AMBULATORY_CARE_PROVIDER_SITE_OTHER): Payer: Self-pay | Admitting: Ophthalmology

## 2020-04-02 ENCOUNTER — Other Ambulatory Visit: Payer: Self-pay

## 2020-04-02 DIAGNOSIS — I10 Essential (primary) hypertension: Secondary | ICD-10-CM

## 2020-04-02 DIAGNOSIS — H3581 Retinal edema: Secondary | ICD-10-CM | POA: Diagnosis not present

## 2020-04-02 DIAGNOSIS — H4311 Vitreous hemorrhage, right eye: Secondary | ICD-10-CM | POA: Diagnosis not present

## 2020-04-02 DIAGNOSIS — E113593 Type 2 diabetes mellitus with proliferative diabetic retinopathy without macular edema, bilateral: Secondary | ICD-10-CM | POA: Diagnosis not present

## 2020-04-02 DIAGNOSIS — Z961 Presence of intraocular lens: Secondary | ICD-10-CM

## 2020-04-02 DIAGNOSIS — H35033 Hypertensive retinopathy, bilateral: Secondary | ICD-10-CM

## 2020-04-02 MED ORDER — BEVACIZUMAB CHEMO INJECTION 1.25MG/0.05ML SYRINGE FOR KALEIDOSCOPE
1.2500 mg | INTRAVITREAL | Status: AC | PRN
  Administered 2020-04-02: 1.25 mg via INTRAVITREAL

## 2020-04-04 ENCOUNTER — Encounter (INDEPENDENT_AMBULATORY_CARE_PROVIDER_SITE_OTHER): Payer: Medicaid Other | Admitting: Ophthalmology

## 2020-05-01 ENCOUNTER — Other Ambulatory Visit: Payer: Self-pay

## 2020-05-01 ENCOUNTER — Ambulatory Visit (INDEPENDENT_AMBULATORY_CARE_PROVIDER_SITE_OTHER): Payer: Medicaid Other | Admitting: Ophthalmology

## 2020-05-01 ENCOUNTER — Encounter (INDEPENDENT_AMBULATORY_CARE_PROVIDER_SITE_OTHER): Payer: Self-pay | Admitting: Ophthalmology

## 2020-05-01 DIAGNOSIS — H35033 Hypertensive retinopathy, bilateral: Secondary | ICD-10-CM

## 2020-05-01 DIAGNOSIS — E113593 Type 2 diabetes mellitus with proliferative diabetic retinopathy without macular edema, bilateral: Secondary | ICD-10-CM | POA: Diagnosis not present

## 2020-05-01 DIAGNOSIS — H4311 Vitreous hemorrhage, right eye: Secondary | ICD-10-CM

## 2020-05-01 DIAGNOSIS — I10 Essential (primary) hypertension: Secondary | ICD-10-CM | POA: Diagnosis not present

## 2020-05-01 DIAGNOSIS — H3581 Retinal edema: Secondary | ICD-10-CM | POA: Diagnosis not present

## 2020-05-01 DIAGNOSIS — Z961 Presence of intraocular lens: Secondary | ICD-10-CM

## 2020-05-01 MED ORDER — BEVACIZUMAB CHEMO INJECTION 1.25MG/0.05ML SYRINGE FOR KALEIDOSCOPE
1.2500 mg | INTRAVITREAL | Status: AC | PRN
  Administered 2020-05-01: 1.25 mg via INTRAVITREAL

## 2020-05-01 NOTE — Progress Notes (Signed)
[facility] - Clinic Note  05/01/2020     CHIEF COMPLAINT Patient presents for Retina Evaluation   HISTORY OF PRESENT ILLNESS: Anna Wagner is a 60 y.o. female who presents to the clinic today for:   HPI    Retina Evaluation    In right eye.  This started 2 days ago.  Duration of 2 days.  Associated Symptoms Distortion.  Context:  distance vision, mid-range vision and near vision.  Treatments tried include artificial tears.  Response to treatment was no improvement.  I, the attending physician,  performed the HPI with the patient and updated documentation appropriately.          Comments    60 y/o female pt referred this a.m. by Dr. Laruth Bouchard for eval of blood in OD.  Pt not due back for exu eval w/Dr. Vanessa Barbara for another 4 wks.  Pt was playing w/grandson two days ago, and accidentally got hit in her OD by a toy ball.  Pt saw Dr. Dione Booze this morning and was immediately referred here.  VA OD extremely blurred.  No change in [place] OS.  Denies pain, FOL, floaters.  Xiidra BID OU.       Last edited by Rennis Chris, MD on 05/01/2020 12:01 PM. (History)    pt states she got hit in the eye by a toy ball by her grandson on Tuesday, pt states later that day she started to see new floaters in her vision, pt states when she woke up yesterday morning her vision was completely blurry, pt went to see Dr. Laruth Bouchard this morning, Dr. Dione Booze sent pt here after looking at her eye, pt denies fol  Referring physician: [name], MD [address] [place],  [place] [address]  HISTORICAL INFORMATION:   Selected notes from the MEDICAL RECORD NUMBER Referred by [facility] Former pt of Dr. Scharlene Corn LEE: 11.15.19 (N. Marchase) [BCVA: OD: 20/60 OS: 20/30] Ocular Hx-NPDR OS, PDR OD, vitreous heme, cataracts OU (pt received IVL OU and PRP OU on 11.15.19)  PMH-DM (A1c: 10.1 05.01.19, takes Humulin and metformin), arthritis, depression, HTN, HLD, neuropathy   CURRENT  MEDICATIONS: Current Outpatient Medications (Ophthalmic Drugs)  Medication Sig  . erythromycin ophthalmic ointment Place 1 application into both eyes as needed.  Marland Kitchen LOTEMAX 0.5 % GEL APPLY 1 DROP TO AFFECTED EYE 4 TIMES A DAY FOR 7 DAYS  . XIIDRA 5 % SOLN Instill 1 drop into both eyes twice a day   No current facility-administered medications for this visit. (Ophthalmic Drugs)   Current Outpatient Medications (Other)  Medication Sig  . acetaminophen (TYLENOL) 500 MG tablet Take 1,000 mg by mouth every 6 (six) hours as needed for fever.  Marland Kitchen amitriptyline (ELAVIL) 100 MG tablet Take 1 tablet (100 mg total) by mouth at bedtime.  Marland Kitchen aspirin EC 81 MG tablet Take 81 mg by mouth at bedtime.   Marland Kitchen atorvastatin (LIPITOR) 40 MG tablet Take 1 tablet (40 mg total) by mouth daily.  . cephALEXin (KEFLEX) 500 MG capsule Take 1 capsule (500 mg total) by mouth 2 (two) times daily.  . cetirizine (ZYRTEC) 10 MG tablet Take 1 tablet (10 mg total) by mouth daily.  . clotrimazole (LOTRIMIN) 1 % cream Apply 1 application topically 2 (two) times daily.  . Cyanocobalamin (VITAMIN B-12 PO) Take 1 tablet by mouth 2 (two) times daily.  . DULoxetine (CYMBALTA) 60 MG capsule Take 1 capsule (60 mg total)  by mouth daily.  . furosemide (LASIX) 20 MG tablet TAKE 1 TABLET (20 MG TOTAL) BY MOUTH DAILY.  Marland Kitchen guaiFENesin (MUCINEX) 600 MG 12 hr tablet Take 1 tablet (600 mg total) by mouth 2 (two) times daily.  . insulin glargine (LANTUS) 100 UNIT/ML injection Inject 95 Units into the skin daily.  . insulin NPH-regular Human (HUMULIN 70/30) (70-30) 100 UNIT/ML injection Inject subcutaneously twice daily 55 units before breakfast and 60 units dinner  . Insulin Syringe-Needle U-100 (TRUEPLUS INSULIN SYRINGE) 30G X 5/16" 1 ML MISC Use as directed  . liraglutide (VICTOZA) 18 MG/3ML SOPN Inject 18 mg into the skin.  Marland Kitchen lisinopril (PRINIVIL,ZESTRIL) 20 MG tablet Take 1 tablet (20 mg total) by mouth daily.  Marland Kitchen LYRICA 100 MG capsule Take 1  capsule by mouth 3 times a day as directed by physician.  . metFORMIN (GLUCOPHAGE) 1000 MG tablet TAKE 1 TABLET BY MOUTH TWICE DAILY WITH A MEAL  . metoprolol tartrate (LOPRESSOR) 50 MG tablet Take 1 tablet (50 mg total) by mouth 2 (two) times daily.  Marland Kitchen oxybutynin (DITROPAN XL) 10 MG 24 hr tablet Take 1 tablet (10 mg total) by mouth at bedtime.  Marland Kitchen oxyCODONE-acetaminophen (PERCOCET/ROXICET) 5-325 MG tablet Take 1 tablet by mouth every 4 (four) hours as needed for severe pain.  . pregabalin (LYRICA) 100 MG capsule Take 100 mg by mouth in the morning, at noon, and at bedtime.  . silver sulfADIAZINE (SILVADENE) 1 % cream Apply 1 application topically 2 (two) times daily.  . traMADol (ULTRAM) 50 MG tablet Take 1 tablet (50 mg total) by mouth at bedtime.  . traZODone (DESYREL) 100 MG tablet Take 100 mg by mouth 2 (two) times daily.   No current facility-administered medications for this visit. (Other)      REVIEW OF SYSTEMS: ROS    Positive for: Endocrine, Eyes   Negative for: Constitutional, Gastrointestinal, Neurological, Skin, Genitourinary, Musculoskeletal, HENT, Cardiovascular, Respiratory, Psychiatric, Allergic/Imm, Heme/Lymph   Last edited by Celine Mans, COA on 05/01/2020 10:39 AM. (History)       ALLERGIES No Known Allergies  PAST MEDICAL HISTORY Past Medical History:  Diagnosis Date  . Bell's palsy 2005  . Cataract   . Diabetes mellitus without complication (HCC)   . Diabetic retinopathy (HCC)    PDR OU  . Hypertension   . Hypertensive retinopathy    OU  . Neuromuscular disorder (HCC)    NEUROPATHY  . Neuropathy   . Sepsis (HCC) 02/11/2016   Past Surgical History:  Procedure Laterality Date  . AMPUTATION Left 03/15/2014   Procedure: AMPUTATION TRANSMETATARSAL;  Surgeon: Nadara Mustard, MD;  Location[facility] OR;  Service: Orthopedics;  Laterality: Left;  . CESAREAN SECTION    . CHOLECYSTECTOMY      FAMILY HISTORY Family History  Problem Relation Age of Onset  .  Heart attack Mother   . Diabetes Father   . Diabetes Sister     SOCIAL HISTORY Social History   Tobacco Use  . Smoking status: Current Every Day Smoker    Packs/day: 1.00    Years: 38.00    Pack years: 38.00    Types: Cigarettes    Last attempt to quit: 09/19/2013    Years since quitting: 6.6  . Smokeless tobacco: Never Used  Substance Use Topics  . Alcohol use: No  . Drug use: Yes    Types: Marijuana         OPHTHALMIC EXAM:  Base Eye Exam    Visual Acuity (Snellen -  Linear)      Right Left   Dist [place] HM 20/40   Dist ph [place] NI 20/30       Tonometry (Tonopen, 10:42 AM)      Right Left   Pressure 15 Def       Pupils      Dark Light Shape React APD   Right 5 5 Round Minimal None   Left 5 5 Round Minimal None  Pharm dil       Visual Fields (Counting fingers)      Left Right    Full    Restrictions  Total superior temporal, inferior temporal, superior nasal, inferior nasal deficiencies       Extraocular Movement      Right Left    Full, Ortho Full, Ortho       Neuro/Psych    Oriented x3: Yes   Mood/Affect: Normal       Dilation    Right eye: 1.0% Mydriacyl, 2.5% Phenylephrine @ 10:42 AM        Slit Lamp and Fundus Exam    Slit Lamp Exam      Right Left   Lids/Lashes Dermatochalasis - upper lid Dermatochalasis - upper lid   Conjunctiva/Sclera nasal and temporal Pinguecula nasal and temporal Pinguecula   Cornea Trace PEE, well healed temporal cataract wounds, Debris in tear film 2-3+PEE, well healed temporal cataract wounds   Anterior Chamber Deep, clear, No cell, flare or hyphema Deep and quiet   Iris Round and moderately dilated, No NVI Round and dilated, No NVI   Lens PC IOL in good position, mild PC folds PC IOL in good position with PC folds   Vitreous Vitreous syneresis, +RBCs, diffuse VH centrally, Posterior vitreous detachment, mild vitreous fibrosis, vitreous condensations Vitreous syneresis, VH centrally resolved       Fundus Exam       Right Left   Disc Pink and Sharp, mild tilt, Compact Pink and Sharp, mild tilt, Compact   C/D Ratio 0.3 0.3   Macula Flat, Blunted foveal reflex, RPE mottling and clumping, Drusen, trace cystic changes - persistent Flat, good foveal relfex, mild Retinal pigment epithelial mottling, Drusen, No edema, focal IRH inferior macula   Vessels Attenuated, tortuous attenuated, Tortuous   Periphery Hazy view, grossly attached, 360 PRP, pre-retinal blot heme nasally--?subretinal component, inferior periphery obscured by VH Attached, good 360 PRP with good posterior fill in changes, rare MA        Refraction    Manifest Refraction      Sphere Cylinder Axis Dist VA   Right -1.00 +0.75 055 20/HM   Left              IMAGING AND PROCEDURES  Imaging and Procedures for @  OCT, Retina - OU - Both Eyes       Right Eye Quality was poor. Findings include (Unable to perform line scans).   Left Eye Quality was good. Central Foveal Thickness: 263. Progression has been stable. Findings include normal foveal contour, no IRF, no SRF, outer retinal atrophy, retinal drusen  (stable resolution of vitreous opacities; partial PVD).   Notes *Images captured and stored on drive  Diagnosis / Impression:  OD: no image today -- Unable to perform line scans OS: stable resolution of vitreous opacities; partial PVD  Clinical management:  See below  Abbreviations: NFP - Normal foveal profile. CME - cystoid macular edema. PED - pigment epithelial detachment. IRF - intraretinal fluid. SRF - subretinal fluid. EZ -  ellipsoid zone. ERM - epiretinal membrane. ORA - outer retinal atrophy. ORT - outer retinal tubulation. SRHM - subretinal hyper-reflective material        Intravitreal Injection, Pharmacologic Agent - OD - Right Eye       Time Out 05/01/2020. 11:53 AM. Confirmed correct patient, procedure, site, and patient consented.   Anesthesia Topical anesthesia was used. Anesthetic medications included  Lidocaine 2%, Proparacaine 0.5%.   Procedure Preparation included 5% betadine to ocular surface, eyelid speculum. A supplied needle was used.   Injection:  1.25 mg Bevacizumab (AVASTIN) 1.25mg /0.66mL SOLN   NDC: 34196-222-97, Lot: [id number] , Expiration date: 05/21/2020   Route: Intravitreal, Site: Right Eye, Waste: 0 mL  Post-op Post injection exam found visual acuity of at least counting fingers. The patient tolerated the procedure well. There were no complications. The patient received written and verbal post procedure care education.        B-Scan Ultrasound - OD - Right Eye       Quality was good. Findings included vitreous hemorrhage, vitreous opacities.   Notes **Images stored on drive**  Impression: OD: vitreous opacities consistent with hemorrhage and PVD; hyperechoic signal nasal midzone (0230) -- subretinal heme; no obvious RT/RD or mass                 ASSESSMENT/PLAN:    ICD-10-CM   1. Vitreous hemorrhage of right eye (HCC)  H43.11 Intravitreal Injection, Pharmacologic Agent - OD - Right Eye    B-Scan Ultrasound - OD - Right Eye    Bevacizumab (AVASTIN) SOLN 1.25 mg  2. Proliferative diabetic retinopathy of both eyes without macular edema associated with type 2 diabetes mellitus (HCC)  07/21/2020 Intravitreal Injection, Pharmacologic Agent - OD - Right Eye    Bevacizumab (AVASTIN) SOLN 1.25 mg  3. Retinal edema  H35.81 OCT, Retina - OU - Both Eyes  4. Essential hypertension  I10   5. Hypertensive retinopathy of both eyes  H35.033   6. Pseudophakia of both eyes  Z96.1     **Pt presents acutely today as urgent referral from S. Groat for recurrent VH OD**  - pt reports being hit with a plastic toy basketball thrown by toddler to OD 2 days ago (2.15.22)   - states that she immediately saw floaters / lines in her vision, went to bed and awoke yesterday with diffusely blurred vision  - was unable to present yesterday due to family obligations, but presented to  Dr. 07-23-1984 this morning, and subsequently referred here  - history of PDR and VH OD as below  - exam shows diffuse VH, but nasally +subretinal heme  - bscan today 2.17.22 shows diffuse VH and retina grossly attached; +hyperechoic signal consistent with subretinal heme at 0230  - discussed findings, prognosis and treatment options  - recommend IVA OD #16 for recurrent VH  - RBA of procedure discussed, questions answered  - informed consent obtained and signed  - see procedure note  - VH precautions reviewed -- minimize activities, keep head elevated, avoid ASA/NSAIDs/blood thinners as able  - f/u Monday for recheck of VH  1,2. Proliferative diabetic retinopathy w/o DME, OU  - formerly pt of Dr. Sunday at [facility]  - s/p PRP OU 11.15.19 -- had not been seen since then  - pt presented here 6.24.20 with progressive vision loss OD since last visit with Dr. 08-29-2003 in 2019  - OD: diffuse VH  - OS: scattered flat NV  - B-scan u/s 6.24.20 shows diffuse VH  OD; no RT/RD or mass  - s/p PRP OS (06.24.20), fill in (07.07.20)  - s/p PRP OD fill in (08.06.20), fill-in (10.12.20)  - s/p IVA OD #1 (06.24.20), #2 (07.22.20), #3 (08.25.20), #4 (09.28.20), #5 (10.26.20), #6 (12.09.20), #7 (01.13.21), #8 (02.24.21), #9 (04.07.21), #10 (05.05.21), #11 (06.02.21), #12 (07.09.21),#13 (08.13.21), #14 (11.05.21), #15 (1.19.22)  - s/p IVA OS #1 (02.24.21) -- for new subhyaloid heme/PDR, #2 (04.07.21), #3 (05.05.21), #4 (06.02.21), #5 (07.09.21), #6 (08.13.21)  - FA (07.09.21) shows no NV OU, persistent late leaking MA's OU  - today, presents urgently for recurrent VH OD  - BCVA OD: HM (decreased); OS: 20/30- (stable)  - OCT stable OS, no scans obtained OD  - recommend IVA OD #16 as above for recurrent VH  - IVA OU consent form signed and scanned on 02.24.21             - f/u Monday, February 21, DFE, [facility]   3. Vitreous Hemorrhage OD  - recurrent VH today secondary to PDR +  trauma as above  - s/p IVA OD #1 06.24.20, #2 (07.22.20),  #3 (08.25.20), #4 (09.28.20), #5 (10.26.20), #6 (12.09.20), #7 (01.13.21), #8 (02.24.21), #9 (04.07.21), #10 (05.05.21), #11 (06.02.21), #12 (07.09.21) #13 (08.13.21), #14 (11.05.21), #15 (1.19.22)  - s/p PRP OD fill in (08.06.20, 10.12.20)   - recommend IVA OD #16 as above for recurrent VH   4,5. Hypertensive retinopathy OU  - discussed importance of tight BP control  - monitor  6. Pseudophakia OU  - s/p CE/IOL (4.9.21, Dr. Laruth BouchardS. Groat)  - beautiful surgery, doing well  - monitor    Ophthalmic Meds Ordered this visit: us opacities persist on OCT Meds ordered this encounter  Medications  . Bevacizumab (AVASTIN) SOLN 1.25 mg       Return in about 4 days (around 05/05/2020) for f/u VH OD, DFE, OCT.  There are no Patient Instructions on file for this visit.   Explained the diagnoses, plan, and follow up with the patient and they expressed understanding.  Patient expressed understanding of the importance of proper follow up care.   This document serves as a record of services personally performed by Karie ChimeraBrian G. Alima Naser, MD, PhD. It was created on their behalf by Cristopher EstimableAndrew Baxley, COT an ophthalmic technician. The creation of this record is the provider's dictation and/or activities during the visit.    Electronically signed by: Cristopher Estimablendrew Baxley, COT 2.17.22 @ 12:27 PM  Abbreviations: M myopia (nearsighted); A astigmatism; H hyperopia (farsighted); P presbyopia; Mrx spectacle prescription;  CTL contact lenses; OD right eye; OS left eye; OU both eyes  XT exotropia; ET esotropia; PEK punctate epithelial keratitis; PEE punctate epithelial erosions; DES dry eye syndrome; MGD meibomian gland dysfunction; ATs artificial tears; PFAT's preservative free artificial tears; NSC nuclear sclerotic cataract; PSC posterior subcapsular cataract; ERM epi-retinal membrane; PVD posterior vitreous detachment; RD retinal detachment; DM diabetes mellitus; DR  diabetic retinopathy; NPDR non-proliferative diabetic retinopathy; PDR proliferative diabetic retinopathy; CSME clinically significant macular edema; DME diabetic macular edema; dbh dot blot hemorrhages; CWS cotton wool spot; POAG primary open angle glaucoma; C/D cup-to-disc ratio; HVF humphrey visual field; GVF goldmann visual field; OCT optical coherence tomography; IOP intraocular pressure; BRVO Branch retinal vein occlusion; CRVO central retinal vein occlusion; CRAO central retinal artery occlusion; BRAO branch retinal artery occlusion; RT retinal tear; SB scleral buckle; PPV pars plana vitrectomy; VH Vitreous hemorrhage; PRP panretinal laser photocoagulation; IVK intravitreal kenalog; VMT vitreomacular traction; MH Macular hole;  NVD neovascularization  of the disc; NVE neovascularization elsewhere; AREDS age related eye disease study; ARMD age related macular degeneration; POAG primary open angle glaucoma; EBMD epithelial/anterior basement membrane dystrophy; ACIOL anterior chamber intraocular lens; IOL intraocular lens; PCIOL posterior chamber intraocular lens; Phaco/IOL phacoemulsification with intraocular lens placement; [place] photorefractive keratectomy; LASIK laser assisted in situ keratomileusis; HTN hypertension; DM diabetes mellitus; COPD chronic obstructive pulmonary disease
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
## 2020-05-05 ENCOUNTER — Ambulatory Visit (INDEPENDENT_AMBULATORY_CARE_PROVIDER_SITE_OTHER): Payer: Medicaid Other | Admitting: Ophthalmology

## 2020-05-05 ENCOUNTER — Encounter (INDEPENDENT_AMBULATORY_CARE_PROVIDER_SITE_OTHER): Payer: Self-pay | Admitting: Ophthalmology

## 2020-05-05 ENCOUNTER — Other Ambulatory Visit: Payer: Self-pay

## 2020-05-05 DIAGNOSIS — I10 Essential (primary) hypertension: Secondary | ICD-10-CM | POA: Diagnosis not present

## 2020-05-05 DIAGNOSIS — H3581 Retinal edema: Secondary | ICD-10-CM

## 2020-05-05 DIAGNOSIS — E113593 Type 2 diabetes mellitus with proliferative diabetic retinopathy without macular edema, bilateral: Secondary | ICD-10-CM

## 2020-05-05 DIAGNOSIS — H35033 Hypertensive retinopathy, bilateral: Secondary | ICD-10-CM

## 2020-05-05 DIAGNOSIS — H4311 Vitreous hemorrhage, right eye: Secondary | ICD-10-CM

## 2020-05-05 DIAGNOSIS — H25813 Combined forms of age-related cataract, bilateral: Secondary | ICD-10-CM

## 2020-05-05 DIAGNOSIS — Z961 Presence of intraocular lens: Secondary | ICD-10-CM

## 2020-05-05 DIAGNOSIS — H25812 Combined forms of age-related cataract, left eye: Secondary | ICD-10-CM
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
## 2020-05-12 ENCOUNTER — Ambulatory Visit (INDEPENDENT_AMBULATORY_CARE_PROVIDER_SITE_OTHER): Payer: Medicaid Other | Admitting: Ophthalmology

## 2020-05-12 ENCOUNTER — Encounter (INDEPENDENT_AMBULATORY_CARE_PROVIDER_SITE_OTHER): Payer: Self-pay | Admitting: Ophthalmology

## 2020-05-12 ENCOUNTER — Other Ambulatory Visit: Payer: Self-pay

## 2020-05-12 DIAGNOSIS — H35033 Hypertensive retinopathy, bilateral: Secondary | ICD-10-CM

## 2020-05-12 DIAGNOSIS — Z961 Presence of intraocular lens: Secondary | ICD-10-CM

## 2020-05-12 DIAGNOSIS — H25813 Combined forms of age-related cataract, bilateral: Secondary | ICD-10-CM

## 2020-05-12 DIAGNOSIS — H3581 Retinal edema: Secondary | ICD-10-CM

## 2020-05-12 DIAGNOSIS — I10 Essential (primary) hypertension: Secondary | ICD-10-CM

## 2020-05-12 DIAGNOSIS — H4311 Vitreous hemorrhage, right eye: Secondary | ICD-10-CM | POA: Diagnosis not present

## 2020-05-12 DIAGNOSIS — E113593 Type 2 diabetes mellitus with proliferative diabetic retinopathy without macular edema, bilateral: Secondary | ICD-10-CM | POA: Diagnosis not present

## 2020-05-12 DIAGNOSIS — H25812 Combined forms of age-related cataract, left eye: Secondary | ICD-10-CM
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
## 2020-05-19 ENCOUNTER — Ambulatory Visit (INDEPENDENT_AMBULATORY_CARE_PROVIDER_SITE_OTHER): Payer: Medicaid Other | Admitting: Ophthalmology

## 2020-05-19 ENCOUNTER — Encounter (INDEPENDENT_AMBULATORY_CARE_PROVIDER_SITE_OTHER): Payer: Self-pay | Admitting: Ophthalmology

## 2020-05-19 ENCOUNTER — Other Ambulatory Visit: Payer: Self-pay

## 2020-05-19 ENCOUNTER — Other Ambulatory Visit (HOSPITAL_COMMUNITY)
Admission: RE | Admit: 2020-05-19 | Discharge: 2020-05-19 | Disposition: A | Discharge status: Home / Self Care | Payer: Medicaid Other | Source: Ambulatory Visit | Attending: Ophthalmology | Admitting: Ophthalmology

## 2020-05-19 DIAGNOSIS — E113593 Type 2 diabetes mellitus with proliferative diabetic retinopathy without macular edema, bilateral: Secondary | ICD-10-CM

## 2020-05-19 DIAGNOSIS — H25813 Combined forms of age-related cataract, bilateral: Secondary | ICD-10-CM

## 2020-05-19 DIAGNOSIS — H35033 Hypertensive retinopathy, bilateral: Secondary | ICD-10-CM

## 2020-05-19 DIAGNOSIS — H25812 Combined forms of age-related cataract, left eye: Secondary | ICD-10-CM

## 2020-05-19 DIAGNOSIS — I10 Essential (primary) hypertension: Secondary | ICD-10-CM | POA: Diagnosis not present

## 2020-05-19 DIAGNOSIS — H4311 Vitreous hemorrhage, right eye: Secondary | ICD-10-CM | POA: Diagnosis not present

## 2020-05-19 DIAGNOSIS — H3581 Retinal edema: Secondary | ICD-10-CM

## 2020-05-19 DIAGNOSIS — Z01812 Encounter for preprocedural laboratory examination: Secondary | ICD-10-CM | POA: Diagnosis present

## 2020-05-19 DIAGNOSIS — Z961 Presence of intraocular lens: Secondary | ICD-10-CM

## 2020-05-19 DIAGNOSIS — Z20822 Contact with and (suspected) exposure to covid-19: Secondary | ICD-10-CM | POA: Insufficient documentation

## 2020-05-19 LAB — SARS CORONAVIRUS 2 (TAT 6-24 HRS): SARS Coronavirus 2: NEGATIVE
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
## 2020-05-21 ENCOUNTER — Encounter (HOSPITAL_COMMUNITY): Payer: Self-pay | Admitting: Ophthalmology

## 2020-05-21 NOTE — Anesthesia Preprocedure Evaluation (Addendum)
Anesthesia Evaluation  Patient identified by MRN, date of birth, ID band Patient awake    Reviewed: Allergy & Precautions, NPO status , Patient's Chart, lab work & pertinent test results, reviewed documented beta blocker date and time   History of Anesthesia Complications Negative for: history of anesthetic complications  Airway Mallampati: II  TM Distance: >3 FB Neck ROM: Full    Dental no notable dental hx. (+) Dental Advisory Given   Pulmonary Current SmokerPatient did not abstain from smoking.,    Pulmonary exam normal        Cardiovascular hypertension, Pt. on home beta blockers and Pt. on medications + Peripheral Vascular Disease  Normal cardiovascular exam     Neuro/Psych PSYCHIATRIC DISORDERS Depression negative neurological ROS     GI/Hepatic negative GI ROS, Neg liver ROS,   Endo/Other  diabetes, Poorly Controlled, Type 2, Oral Hypoglycemic Agents, Insulin Dependenta1c 8.9, FS 121 in preop   Renal/GU negative Renal ROS     Musculoskeletal negative musculoskeletal ROS (+)   Abdominal   Peds  Hematology negative hematology ROS (+)   Anesthesia Other Findings   Reproductive/Obstetrics                          Anesthesia Physical Anesthesia Plan  ASA: III  Anesthesia Plan: General   Post-op Pain Management:    Induction: Intravenous  PONV Risk Score and Plan: 3 and Ondansetron, Dexamethasone and Scopolamine patch - Pre-op  Airway Management Planned: LMA  Additional Equipment:   Intra-op Plan:   Post-operative Plan: Extubation in OR  Informed Consent: I have reviewed the patients History and Physical, chart, labs and discussed the procedure including the risks, benefits and alternatives for the proposed anesthesia with the patient or authorized representative who has indicated his/her understanding and acceptance.     Dental advisory given  Plan Discussed with:  Anesthesiologist and CRNA  Anesthesia Plan Comments: (PAT note written 05/21/2020 by Shonna Chock, PA-C. SAME DAY WORK-UP   )     Anesthesia Quick Evaluation
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
## 2020-05-21 NOTE — Progress Notes (Signed)
Patient denies shortness of breath, fever, cough or chest pain.  PCP - Tonye Becket, PA Cardiologist - n/a  Chest x-ray - n/a EKG - 05/21/20 Stress Test - n/a ECHO - n/a Cardiac Cath - n/a  Fasting Blood Sugar - 130s Checks Blood Sugar 1 times a day  . Do not take metformin or victoza the morning of surgery.      . THE MORNING OF SURGERY, take 50 Units Lantus Insulin.  Anna Wagner your blood sugar is less than 70 mg/dL, you will need to treat for low blood sugar: o Treat a low blood sugar (less than 70 mg/dL) with  cup of clear juice (cranberry or apple), 4 glucose tablets, OR glucose gel. o Recheck blood sugar in 15 minutes after treatment (to make sure it is greater than 70 mg/dL). If your blood sugar is not greater than 70 mg/dL on recheck, call [phone] for further instructions.  Aspirin Instructions: Last dose of ASA was on 05/21/20  Anesthesia review: Yes  STOP now taking any Aspirin (unless otherwise instructed by your surgeon), Aleve, Naproxen, Ibuprofen, Motrin, Advil, Goody's, BC's, all herbal medications, fish oil, and all vitamins.   Coronavirus Screening Covid test on 05/19/20 was negative.  Patient verbalized understanding of instructions that were given via phone.

## 2020-05-22 ENCOUNTER — Ambulatory Visit (HOSPITAL_COMMUNITY): Payer: Medicare (Managed Care) | Admitting: Vascular Surgery

## 2020-05-22 ENCOUNTER — Encounter (HOSPITAL_COMMUNITY): Payer: Self-pay | Admitting: Ophthalmology

## 2020-05-22 ENCOUNTER — Encounter (HOSPITAL_COMMUNITY)
Admission: RE | Disposition: A | Discharge status: Home / Self Care | Payer: Self-pay | Source: Home / Self Care | Attending: Ophthalmology

## 2020-05-22 ENCOUNTER — Ambulatory Visit (HOSPITAL_COMMUNITY)
Admission: RE | Admit: 2020-05-22 | Discharge: 2020-05-22 | Disposition: A | Discharge status: Home / Self Care | Payer: Medicare (Managed Care) | Attending: Ophthalmology | Admitting: Ophthalmology

## 2020-05-22 ENCOUNTER — Other Ambulatory Visit: Payer: Self-pay

## 2020-05-22 DIAGNOSIS — E113511 Type 2 diabetes mellitus with proliferative diabetic retinopathy with macular edema, right eye: Secondary | ICD-10-CM

## 2020-05-22 DIAGNOSIS — H4311 Vitreous hemorrhage, right eye: Secondary | ICD-10-CM | POA: Diagnosis present

## 2020-05-22 DIAGNOSIS — H43811 Vitreous degeneration, right eye: Secondary | ICD-10-CM | POA: Diagnosis not present

## 2020-05-22 DIAGNOSIS — F17210 Nicotine dependence, cigarettes, uncomplicated: Secondary | ICD-10-CM | POA: Insufficient documentation

## 2020-05-22 DIAGNOSIS — E113591 Type 2 diabetes mellitus with proliferative diabetic retinopathy without macular edema, right eye: Secondary | ICD-10-CM | POA: Insufficient documentation

## 2020-05-22 HISTORY — DX: Hyperlipidemia, unspecified: E78.5

## 2020-05-22 HISTORY — DX: Depression, unspecified: F32.A

## 2020-05-22 HISTORY — PX: PARS PLANA VITRECTOMY: SHX2166

## 2020-05-22 LAB — BASIC METABOLIC PANEL
Anion gap: 7 (ref 5–15)
BUN: 16 mg/dL (ref 6–20)
CO2: 22 mmol/L (ref 22–32)
Calcium: 9 mg/dL (ref 8.9–10.3)
Chloride: 109 mmol/L (ref 98–111)
Creatinine, Ser: 0.7 mg/dL (ref 0.44–1.00)
GFR, Estimated: >60 mL/min (ref >60)
Glucose, Bld: 121 mg/dL — ABNORMAL HIGH (ref 70–99)
Potassium: 4.1 mmol/L (ref 3.5–5.1)
Sodium: 138 mmol/L (ref 135–145)

## 2020-05-22 LAB — GLUCOSE, CAPILLARY
Glucose-Capillary: 122 mg/dL — ABNORMAL HIGH (ref 70–99)
Glucose-Capillary: 143 mg/dL — ABNORMAL HIGH (ref 70–99)

## 2020-05-22 SURGERY — PARS PLANA VITRECTOMY WITH 25 GAUGE
Anesthesia: General | Site: Eye | Laterality: Right

## 2020-05-22 MED ORDER — STERILE WATER FOR INJECTION IJ SOLN
INTRAMUSCULAR | Status: DC | PRN
  Administered 2020-05-22: 20 mL

## 2020-05-22 MED ORDER — GATIFLOXACIN 0.5 % OP SOLN
OPHTHALMIC | Status: AC
  Filled 2020-05-22: qty 2.5

## 2020-05-22 MED ORDER — NA CHONDROIT SULF-NA HYALURON 40-30 MG/ML IO SOSY
INTRAOCULAR | Status: DC | PRN
  Administered 2020-05-22: 0.5 mL via INTRAOCULAR

## 2020-05-22 MED ORDER — PHENYLEPHRINE HCL-NACL 10-0.9 MG/250ML-% IV SOLN
INTRAVENOUS | Status: DC | PRN
  Administered 2020-05-22: 50 ug/min via INTRAVENOUS

## 2020-05-22 MED ORDER — PROPOFOL 10 MG/ML IV BOLUS
INTRAVENOUS | Status: AC
  Filled 2020-05-22: qty 20

## 2020-05-22 MED ORDER — PREDNISOLONE ACETATE 1 % OP SUSP
OPHTHALMIC | Status: DC | PRN
  Administered 2020-05-22: 1 [drp] via OPHTHALMIC

## 2020-05-22 MED ORDER — BUPIVACAINE HCL (PF) 0.75 % IJ SOLN
INTRAMUSCULAR | Status: AC
  Filled 2020-05-22: qty 10

## 2020-05-22 MED ORDER — LIDOCAINE HCL (CARDIAC) PF 100 MG/5ML IV SOSY
PREFILLED_SYRINGE | INTRAVENOUS | Status: DC | PRN
  Administered 2020-05-22: 80 mg via INTRATRACHEAL

## 2020-05-22 MED ORDER — SODIUM CHLORIDE 0.9 % IV SOLN: INTRAVENOUS | Status: DC

## 2020-05-22 MED ORDER — BACITRACIN-POLYMYXIN B 500-10000 UNIT/GM OP OINT
TOPICAL_OINTMENT | OPHTHALMIC | Status: DC | PRN
  Administered 2020-05-22: 1 via OPHTHALMIC

## 2020-05-22 MED ORDER — FENTANYL CITRATE (PF) 100 MCG/2ML IJ SOLN
INTRAMUSCULAR | Status: AC
  Filled 2020-05-22: qty 2

## 2020-05-22 MED ORDER — MIDAZOLAM HCL 5 MG/5ML IJ SOLN
INTRAMUSCULAR | Status: DC | PRN
  Administered 2020-05-22: 2 mg via INTRAVENOUS

## 2020-05-22 MED ORDER — PREDNISOLONE ACETATE 1 % OP SUSP
OPHTHALMIC | Status: AC
  Filled 2020-05-22: qty 5

## 2020-05-22 MED ORDER — EPINEPHRINE PF 1 MG/ML IJ SOLN
INTRAMUSCULAR | Status: AC
  Filled 2020-05-22: qty 1

## 2020-05-22 MED ORDER — FENTANYL CITRATE (PF) 250 MCG/5ML IJ SOLN
INTRAMUSCULAR | Status: AC
  Filled 2020-05-22: qty 5

## 2020-05-22 MED ORDER — GATIFLOXACIN 0.5 % OP SOLN OPTIME - NO CHARGE
OPHTHALMIC | Status: DC | PRN
  Administered 2020-05-22: 1 [drp] via OPHTHALMIC

## 2020-05-22 MED ORDER — TROPICAMIDE 1 % OP SOLN
1.0000 [drp] | OPHTHALMIC | Status: AC | PRN
  Administered 2020-05-22 (×3): 1 [drp] via OPHTHALMIC
  Filled 2020-05-22: qty 15

## 2020-05-22 MED ORDER — POLYMYXIN B SULFATE 500000 UNITS IJ SOLR
INTRAMUSCULAR | Status: AC
  Filled 2020-05-22: qty 500000

## 2020-05-22 MED ORDER — ONDANSETRON HCL 4 MG/2ML IJ SOLN
INTRAMUSCULAR | Status: DC | PRN
  Administered 2020-05-22: 4 mg via INTRAVENOUS

## 2020-05-22 MED ORDER — AMISULPRIDE (ANTIEMETIC) 5 MG/2ML IV SOLN: 10.0000 mg | Freq: Once | INTRAVENOUS | Status: DC | PRN

## 2020-05-22 MED ORDER — EPINEPHRINE PF 1 MG/ML IJ SOLN
INTRAMUSCULAR | Status: DC | PRN
  Administered 2020-05-22: .3 mL

## 2020-05-22 MED ORDER — BRIMONIDINE TARTRATE 0.2 % OP SOLN
OPHTHALMIC | Status: AC
  Filled 2020-05-22: qty 5

## 2020-05-22 MED ORDER — PHENYLEPHRINE 40 MCG/ML (10ML) SYRINGE FOR IV PUSH (FOR BLOOD PRESSURE SUPPORT)
PREFILLED_SYRINGE | INTRAVENOUS | Status: DC | PRN
  Administered 2020-05-22 (×2): 40 ug via INTRAVENOUS
  Administered 2020-05-22 (×2): 80 ug via INTRAVENOUS

## 2020-05-22 MED ORDER — BRIMONIDINE TARTRATE 0.2 % OP SOLN
OPHTHALMIC | Status: DC | PRN
  Administered 2020-05-22: 1 [drp] via OPHTHALMIC

## 2020-05-22 MED ORDER — FENTANYL CITRATE (PF) 250 MCG/5ML IJ SOLN
INTRAMUSCULAR | Status: DC | PRN
  Administered 2020-05-22: 25 ug via INTRAVENOUS

## 2020-05-22 MED ORDER — PROPARACAINE HCL 0.5 % OP SOLN
1.0000 [drp] | OPHTHALMIC | Status: AC | PRN
  Administered 2020-05-22 (×3): 1 [drp] via OPHTHALMIC
  Filled 2020-05-22: qty 15

## 2020-05-22 MED ORDER — DEXAMETHASONE SODIUM PHOSPHATE 10 MG/ML IJ SOLN
INTRAMUSCULAR | Status: DC | PRN
  Administered 2020-05-22: 4 mg via INTRAVENOUS

## 2020-05-22 MED ORDER — CHLORHEXIDINE GLUCONATE 0.12 % MT SOLN
15.0000 mL | Freq: Once | OROMUCOSAL | Status: AC
  Administered 2020-05-22: 15 mL via OROMUCOSAL
  Filled 2020-05-22: qty 15

## 2020-05-22 MED ORDER — DORZOLAMIDE HCL-TIMOLOL MAL 2-0.5 % OP SOLN
OPHTHALMIC | Status: DC | PRN
  Administered 2020-05-22: 1 [drp] via OPHTHALMIC

## 2020-05-22 MED ORDER — BSS PLUS IO SOLN
INTRAOCULAR | Status: AC
  Filled 2020-05-22: qty 500

## 2020-05-22 MED ORDER — FENTANYL CITRATE (PF) 100 MCG/2ML IJ SOLN
25.0000 ug | INTRAMUSCULAR | Status: DC | PRN
  Administered 2020-05-22 (×2): 50 ug via INTRAVENOUS

## 2020-05-22 MED ORDER — TRIAMCINOLONE ACETONIDE 40 MG/ML IJ SUSP
INTRAMUSCULAR | Status: DC | PRN
  Administered 2020-05-22: 40 mg

## 2020-05-22 MED ORDER — SODIUM CHLORIDE (PF) 0.9 % IJ SOLN: INTRAMUSCULAR | Status: DC | PRN

## 2020-05-22 MED ORDER — BSS PLUS IO SOLN
INTRAOCULAR | Status: DC | PRN
  Administered 2020-05-22: 1 via INTRAOCULAR

## 2020-05-22 MED ORDER — DORZOLAMIDE HCL-TIMOLOL MAL 2-0.5 % OP SOLN
OPHTHALMIC | Status: AC
  Filled 2020-05-22: qty 10

## 2020-05-22 MED ORDER — ATROPINE SULFATE 1 % OP SOLN
1.0000 [drp] | OPHTHALMIC | Status: AC | PRN
  Administered 2020-05-22 (×3): 1 [drp] via OPHTHALMIC
  Filled 2020-05-22: qty 5

## 2020-05-22 MED ORDER — MIDAZOLAM HCL 2 MG/2ML IJ SOLN
INTRAMUSCULAR | Status: AC
  Filled 2020-05-22: qty 2

## 2020-05-22 MED ORDER — STERILE WATER FOR INJECTION IJ SOLN: INTRAMUSCULAR | Status: DC | PRN

## 2020-05-22 MED ORDER — PHENYLEPHRINE HCL 10 % OP SOLN
1.0000 [drp] | OPHTHALMIC | Status: AC | PRN
  Administered 2020-05-22 (×3): 1 [drp] via OPHTHALMIC
  Filled 2020-05-22: qty 5

## 2020-05-22 MED ORDER — LACTATED RINGERS IV SOLN: INTRAVENOUS | Status: DC

## 2020-05-22 MED ORDER — TRIAMCINOLONE ACETONIDE 40 MG/ML IJ SUSP
INTRAMUSCULAR | Status: AC
  Filled 2020-05-22: qty 5

## 2020-05-22 MED ORDER — ORAL CARE MOUTH RINSE: 15.0000 mL | Freq: Once | OROMUCOSAL | Status: AC

## 2020-05-22 MED ORDER — LIDOCAINE HCL (PF) 2 % IJ SOLN
INTRAMUSCULAR | Status: AC
  Filled 2020-05-22: qty 10

## 2020-05-22 MED ORDER — STERILE WATER FOR INJECTION IJ SOLN
INTRAMUSCULAR | Status: AC
  Filled 2020-05-22: qty 10

## 2020-05-22 MED ORDER — BSS IO SOLN
INTRAOCULAR | Status: AC
  Filled 2020-05-22: qty 15

## 2020-05-22 MED ORDER — PROPOFOL 10 MG/ML IV BOLUS
INTRAVENOUS | Status: DC | PRN
  Administered 2020-05-22: 150 mg via INTRAVENOUS

## 2020-05-22 MED ORDER — BEVACIZUMAB CHEMO INJECTION 1.25MG/0.05ML SYRINGE FOR KALEIDOSCOPE
1.2500 mg | Freq: Once | INTRAVITREAL | Status: AC
  Administered 2020-05-22: 1.25 mg via INTRAVITREAL
  Filled 2020-05-22 (×5): qty 0.1

## 2020-05-22 MED ORDER — BACITRACIN-POLYMYXIN B 500-10000 UNIT/GM OP OINT
TOPICAL_OINTMENT | OPHTHALMIC | Status: AC
  Filled 2020-05-22: qty 3.5

## 2020-05-22 MED ORDER — CEFTAZIDIME 1 G IJ SOLR
INTRAMUSCULAR | Status: AC
  Filled 2020-05-22: qty 1

## 2020-05-22 SURGICAL SUPPLY — 62 items
APPLICATOR COTTON TIP 6 STRL (MISCELLANEOUS) ×4 IMPLANT
APPLICATOR COTTON TIP 6IN STRL (MISCELLANEOUS) ×8
BAND WRIST GAS GREEN (MISCELLANEOUS) IMPLANT
BLADE EYE CATARACT 19 1.4 BEAV (BLADE) IMPLANT
BNDG EYE OVAL (GAUZE/BANDAGES/DRESSINGS) ×2 IMPLANT
CABLE BIPOLOR RESECTION CORD (MISCELLANEOUS) ×2 IMPLANT
CANNULA ANT CHAM MAIN (OPHTHALMIC RELATED) IMPLANT
CANNULA FLEX TIP 25G (CANNULA) ×2 IMPLANT
CANNULA TROCAR 23 GA VLV (OPHTHALMIC) IMPLANT
CANNULA VLV SOFT TIP 25GA (OPHTHALMIC) IMPLANT
CLSR STERI-STRIP ANTIMIC 1/2X4 (GAUZE/BANDAGES/DRESSINGS) ×2 IMPLANT
COTTONBALL LRG STERILE PKG (GAUZE/BANDAGES/DRESSINGS) IMPLANT
COVER WAND RF STERILE (DRAPES) IMPLANT
DRAPE MICROSCOPE LEICA 46X105 (MISCELLANEOUS) ×2 IMPLANT
DRAPE OPHTHALMIC 77X100 STRL (CUSTOM PROCEDURE TRAY) ×2 IMPLANT
ERASER HMR WETFIELD 23G BP (MISCELLANEOUS) IMPLANT
FILTER BLUE MILLIPORE (MISCELLANEOUS) IMPLANT
FILTER STRAW FLUID ASPIR (MISCELLANEOUS) ×2 IMPLANT
FORCEPS GRIESHABER ILM 25G A (INSTRUMENTS) IMPLANT
GAS AUTO FILL CONSTEL (OPHTHALMIC)
GAS AUTO FILL CONSTELLATION (OPHTHALMIC) IMPLANT
GAS WRIST BAND GREEN (MISCELLANEOUS)
GLOVE BIO SURGEON STRL SZ7.5 (GLOVE) ×4 IMPLANT
GLOVE BIOGEL M 7.0 STRL (GLOVE) ×2 IMPLANT
GOWN STRL REUS W/ TWL LRG LVL3 (GOWN DISPOSABLE) ×2 IMPLANT
GOWN STRL REUS W/ TWL XL LVL3 (GOWN DISPOSABLE) ×1 IMPLANT
GOWN STRL REUS W/TWL LRG LVL3 (GOWN DISPOSABLE) ×4
GOWN STRL REUS W/TWL XL LVL3 (GOWN DISPOSABLE) ×2
KIT BASIN OR (CUSTOM PROCEDURE TRAY) ×2 IMPLANT
KIT PERFLUORON PROCEDURE 5ML (MISCELLANEOUS) IMPLANT
LENS MACULAR ASPHERIC CONSTEL (OPHTHALMIC) IMPLANT
LENS VITRECTOMY FLAT OCLR DISP (MISCELLANEOUS) IMPLANT
LOOP FINESSE 25 GA (MISCELLANEOUS) IMPLANT
MICROPICK 25G (MISCELLANEOUS)
NEEDLE 18GX1X1/2 (RX/OR ONLY) (NEEDLE) ×4 IMPLANT
NEEDLE 25GX 5/8IN NON SAFETY (NEEDLE) ×6 IMPLANT
NEEDLE HYPO 30X.5 LL (NEEDLE) ×4 IMPLANT
NEEDLE PRECISIONGLIDE 27X1.5 (NEEDLE) IMPLANT
NS IRRIG 1000ML POUR BTL (IV SOLUTION) IMPLANT
PACK VITRECTOMY CUSTOM (CUSTOM PROCEDURE TRAY) ×2 IMPLANT
PAD ARMBOARD 7.5X6 YLW CONV (MISCELLANEOUS) ×4 IMPLANT
PAK PIK VITRECTOMY CVS 25GA (OPHTHALMIC) ×2 IMPLANT
PENCIL BIPOLAR 25GA STR DISP (OPHTHALMIC RELATED) ×2 IMPLANT
PICK MICROPICK 25G (MISCELLANEOUS) IMPLANT
PROBE DIATHERMY DSP 27GA (MISCELLANEOUS) IMPLANT
PROBE ENDO DIATHERMY 25G (MISCELLANEOUS) IMPLANT
PROBE LASER ILLUM FLEX CVD 25G (OPHTHALMIC) ×2 IMPLANT
REPL STRA BRUSH NEEDLE (NEEDLE) IMPLANT
RESERVOIR BACK FLUSH (MISCELLANEOUS) IMPLANT
RETRACTOR IRIS FLEX 25G GRIESH (INSTRUMENTS) IMPLANT
SET INJECTOR OIL FLUID CONSTEL (OPHTHALMIC) IMPLANT
SPONGE SURGIFOAM ABS GEL 12-7 (HEMOSTASIS) IMPLANT
STOPCOCK 4 WAY LG BORE MALE ST (IV SETS) IMPLANT
SUT VICRYL 7 0 TG140 8 (SUTURE) ×2 IMPLANT
SYR 10ML LL (SYRINGE) ×2 IMPLANT
SYR 20ML LL LF (SYRINGE) ×2 IMPLANT
SYR 5ML LL (SYRINGE) IMPLANT
SYR BULB EAR ULCER 3OZ GRN STR (SYRINGE) IMPLANT
SYR TB 1ML LUER SLIP (SYRINGE) ×4 IMPLANT
TOWEL GREEN STERILE FF (TOWEL DISPOSABLE) ×2 IMPLANT
TUBING HIGH PRESS EXTEN 6IN (TUBING) ×2 IMPLANT
WATER STERILE IRR 1000ML POUR (IV SOLUTION) ×2 IMPLANT

## 2020-05-22 NOTE — Anesthesia Procedure Notes (Addendum)
Procedure Name: LMA Insertion Date/Time: 05/22/2020 3:20 PM Performed by: Sonda Primes, CRNA Pre-anesthesia Checklist: Patient identified, Emergency Drugs available, Suction available and Patient being monitored Patient Re-evaluated:Patient Re-evaluated prior to induction Oxygen Delivery Method: Circle System Utilized Preoxygenation: Pre-oxygenation with 100% oxygen Induction Type: IV induction Ventilation: Mask ventilation without difficulty LMA: LMA inserted LMA Size: 4.0 Tube size: 4.0 mm Number of attempts: 1 Airway Equipment and Method: Bite block Placement Confirmation: positive ETCO2 Tube secured with: Tape Dental Injury: Teeth and Oropharynx as per pre-operative assessment

## 2020-05-22 NOTE — Interval H&P Note (Signed)
History and Physical Interval Note:  05/22/2020 3:00 PM  Anna Wagner  has presented today for surgery, with the diagnosis of vitreous hemorrhage, left eye.  The various methods of treatment have been discussed with the patient and family. After consideration of risks, benefits and other options for treatment, the patient has consented to  Procedure(s): PARS PLANA VITRECTOMY WITH 25 GAUGE (Right) as a surgical intervention.  The patient's history has been reviewed, patient examined, no change in status, stable for surgery.  I have reviewed the patient's chart and labs.  Questions were answered to the patient's satisfaction.     Rennis Chris

## 2020-05-22 NOTE — Op Note (Signed)
Date of procedure: 03.10.22   Surgeon: Rennis Chris, MD, PhD  Assistant: Laurian Brim, Ophthalmic Assistant  Pre-operative Diagnoses:  1. Non-clearing vitreous hemorrhage, Right Eye 2. Proliferative diabetic retinopathy, Right Eye   Post-operative diagnosis:  1. Non-clearing vitreous hemorrhage, Right Eye 2. Proliferative diabetic retinopathy, Right Eye   Anesthesia: GETA   Procedure: 1.  25 gauge pars plana vitrectomy, Right Eye 2.  Endolaser panretinal photocoagulation, Right Eye 3.  Partial Fluid Air Exchange, Right Eye 4.  Intravitreal injection of bevacizumab (Avastin), Right Eye  Complications: none Estimated blood loss: minimal Specimens: none   Brief history:  The patient has a history of decreased vision in the affected eye, and on examination, was noted to have a dense, nonclearing vitreous hemorrhage, affecting activities of daily living.  The risks, benefits, and alternatives were explained to the patient, including pain, bleeding, infection, loss of vision, double vision, droopy eyelids, and need for more surgeries.  Informed consent was obtained from the patient and placed in the chart.       Procedure: The patient was brought to the preoperative holding area where the correct eye was confirmed and marked.  The patient was then brought to the operating room where general endotracheal anesthesia was induced. A secondary time-out was performed to identify the correct patient, eye, procedure, and any allergies. The eye was prepped and draped in the usual sterile ophthalmic fashion followed by placement of a lid speculum.  A 25 gauge trocar was placed in the inferotemporal quadrant in a beveled fashion. A 4 mm infusion cannula was placed through this trocar. The infusion cannula was confirmed in the vitreous cavity with no incarceration of retina or choroid prior to turning it on. Two additional 25 gauge trocars were placed in the superonasal and superotemporal quadrants (2  and 10 oclock, respectively) in a similar beveled fashion. At this time, anterior vitrectomy was performed under direct visualization. Then, a standard three-port pars plana vitrectomy was performed using the light pipe, the cutter, and the BIOM viewing system. A thorough core and peripheral vitreous dissection was performed. Of note, there was a dense vitreous and preretinal hemorrhage obscuring the view of the posterior pole and macula. Also, there was a focal area of fibrosis with surrounding preretinal hemorrhage nasal to the disc which appeared to be the source of the hemorrhage. A posterior vitreous detachment was confirmed over the optic nerve with the assistance of kenalog.     Using scleral depression, the blood stained vitreous base was carefully removed. Then, fill in PRP endolaser was performed 360 to prophylax against repeat vitreous hemorrhage. A partial fluid-air exchange was performed with a soft tip extrusion cannula.  At this time, the trocars were removed and sutured with 7-0 vicryl in an interrupted fashion. Subconjunctival injections of Antibiotic and kenalog were administered. Using a 30g needle, 1.25 mg (0.05 mL) of bevacizumab was injected intravitreally, 3.5 mm posterior to the limbus in the superotemporal quadrant. 5% betadine was dropped on the injection site pre- and post-injection, then rinsed with sterile BSS. On digital palpation, the eye was found to have physiologic intraocular pressure. The lid speculum and drapes were removed. Drops of an antibiotic, antihypertensives, and steroid were given. The eye was patched and shielded. The patient tolerated the procedure well without any intraoperative or immediate postoperative complications. The patient was taken to the recovery room in good condition. The patient was instructed to maintain a head-upright position and will be seen by Dr. Vanessa Barbara tomorrow morning in clinic.

## 2020-05-22 NOTE — Anesthesia Postprocedure Evaluation (Signed)
Anesthesia Post Note  Patient: Anna Wagner  Procedure(s) Performed: PARS PLANA VITRECTOMY WITH 25 GAUGE (Right Eye)     Patient location during evaluation: PACU Anesthesia Type: General Level of consciousness: awake Pain management: pain level controlled Vital Signs Assessment: post-procedure vital signs reviewed and stable Respiratory status: spontaneous breathing, nonlabored ventilation, respiratory function stable and patient connected to nasal cannula oxygen Cardiovascular status: blood pressure returned to baseline and stable Postop Assessment: no apparent nausea or vomiting Anesthetic complications: no   No complications documented.  Last Vitals:  Vitals:   05/22/20 1730 05/22/20 1745  BP: (!) 147/65 136/60  Pulse: 73 73  Resp: 12 10  Temp:  (!) 36.3 C  SpO2: 96% 94%    Last Pain:  Vitals:   05/22/20 1745  TempSrc:   PainSc: 2                  Batoul Limes P Kayshaun Polanco

## 2020-05-22 NOTE — Discharge Instructions (Addendum)
POSTOPERATIVE INSTRUCTIONS  Your doctor has performed vitreoretinal surgery on you at [facility]. [facility]  - Keep eye patched and shielded until seen by Dr. Vanessa Barbara 8 AM tomorrow in clinic - Do not use drops until return - KEEP HEAD ELEVATED - Sleep with head elevated to 45 degrees or greater    - No strenuous bending, stooping or lifting.  - You may not drive until further notice.  - If your doctor used a gas bubble in your eye during the procedure he will advise you on postoperative positioning. If you have a gas bubble you will be wearing a green bracelet that was applied in the operating room. The green bracelet should stay on as long as the gas bubble is in your eye. While the gas bubble is present you should not fly in an airplane. If you require general anesthesia while the gas bubble is present you must notify your anesthesiologist that an intraocular gas bubble is present so he can take the appropriate precautions.  - Tylenol or any other over-the-counter pain reliever can be used according to your doctor. If more pain medicine is required, your doctor will have a prescription for you.  - You may read, go up and down stairs, and watch television.     Rennis Chris, M.D., Ph.D.

## 2020-05-22 NOTE — Brief Op Note (Signed)
05/22/2020  5:01 PM  PATIENT:  Anna Wagner  60 y.o. female  PRE-OPERATIVE DIAGNOSIS:  vitreous hemorrhage, right eye  POST-OPERATIVE DIAGNOSIS:  vitreous hemorrhage, right eye  PROCEDURE:  Procedure(s): PARS PLANA VITRECTOMY WITH 25 GAUGE (Right)  SURGEON:  Surgeon(s) and Role:    Rennis Chris, MD - Primary  ASSISTANTS: Laurian Brim, Ophthalmic Assistant    ANESTHESIA:   general  EBL:  minimal   BLOOD ADMINISTERED:none  DRAINS: none   LOCAL MEDICATIONS USED:  NONE  SPECIMEN:  No Specimen  DISPOSITION OF SPECIMEN:  N/A  COUNTS:  YES  TOURNIQUET:  * No tourniquets in log *  DICTATION: .Note written in EPIC  PLAN OF CARE: Discharge to home after PACU  PATIENT DISPOSITION:  PACU - hemodynamically stable.   Delay start of Pharmacological VTE agent (>24hrs) due to surgical blood loss or risk of bleeding: not applicable

## 2020-05-22 NOTE — Transfer of Care (Signed)
Immediate Anesthesia Transfer of Care Note  Patient: Anna Wagner  Procedure(s) Performed: PARS PLANA VITRECTOMY WITH 25 GAUGE (Right Eye)  Patient Location: PACU   Anesthesia Type:General  Level of Consciousness: awake, alert , oriented and patient cooperative  Airway & Oxygen Therapy: Patient Spontanous Breathing and Patient connected to nasal cannula oxygen  Post-op Assessment: Report given to RN and Post -op Vital signs reviewed and stable  Post vital signs: Reviewed and stable  Last Vitals:  Vitals Value Taken Time  BP 156/68 05/22/20 1700  Temp 36.6 C 05/22/20 1700  Pulse 72 05/22/20 1702  Resp 15 05/22/20 1702  SpO2 93 % 05/22/20 1702  Vitals shown include unvalidated device data.  Last Pain:  Vitals:   05/22/20 1215  TempSrc:   PainSc: 0-No pain         Complications: No complications documented.

## 2020-05-23 ENCOUNTER — Encounter (HOSPITAL_COMMUNITY): Payer: Self-pay | Admitting: Ophthalmology

## 2020-05-23 ENCOUNTER — Ambulatory Visit (INDEPENDENT_AMBULATORY_CARE_PROVIDER_SITE_OTHER): Payer: Medicaid Other | Admitting: Ophthalmology

## 2020-05-23 DIAGNOSIS — H3581 Retinal edema: Secondary | ICD-10-CM

## 2020-05-23 DIAGNOSIS — E113593 Type 2 diabetes mellitus with proliferative diabetic retinopathy without macular edema, bilateral: Secondary | ICD-10-CM

## 2020-05-23 DIAGNOSIS — I10 Essential (primary) hypertension: Secondary | ICD-10-CM

## 2020-05-23 DIAGNOSIS — H35033 Hypertensive retinopathy, bilateral: Secondary | ICD-10-CM

## 2020-05-23 DIAGNOSIS — Z961 Presence of intraocular lens: Secondary | ICD-10-CM

## 2020-05-23 DIAGNOSIS — H4311 Vitreous hemorrhage, right eye: Secondary | ICD-10-CM

## 2020-05-28 ENCOUNTER — Encounter (INDEPENDENT_AMBULATORY_CARE_PROVIDER_SITE_OTHER): Payer: Self-pay

## 2020-05-28 ENCOUNTER — Encounter (INDEPENDENT_AMBULATORY_CARE_PROVIDER_SITE_OTHER): Payer: Medicaid Other | Admitting: Ophthalmology
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
## 2020-05-29 ENCOUNTER — Other Ambulatory Visit: Payer: Self-pay

## 2020-05-29 ENCOUNTER — Ambulatory Visit (INDEPENDENT_AMBULATORY_CARE_PROVIDER_SITE_OTHER): Payer: Medicaid Other | Admitting: Ophthalmology

## 2020-05-29 ENCOUNTER — Other Ambulatory Visit (INDEPENDENT_AMBULATORY_CARE_PROVIDER_SITE_OTHER): Payer: Self-pay

## 2020-05-29 ENCOUNTER — Encounter (INDEPENDENT_AMBULATORY_CARE_PROVIDER_SITE_OTHER): Payer: Self-pay | Admitting: Ophthalmology

## 2020-05-29 DIAGNOSIS — I10 Essential (primary) hypertension: Secondary | ICD-10-CM | POA: Diagnosis not present

## 2020-05-29 DIAGNOSIS — H35033 Hypertensive retinopathy, bilateral: Secondary | ICD-10-CM

## 2020-05-29 DIAGNOSIS — H4311 Vitreous hemorrhage, right eye: Secondary | ICD-10-CM | POA: Diagnosis not present

## 2020-05-29 DIAGNOSIS — E113593 Type 2 diabetes mellitus with proliferative diabetic retinopathy without macular edema, bilateral: Secondary | ICD-10-CM | POA: Diagnosis not present

## 2020-05-29 DIAGNOSIS — Z961 Presence of intraocular lens: Secondary | ICD-10-CM

## 2020-05-29 DIAGNOSIS — H3581 Retinal edema: Secondary | ICD-10-CM

## 2020-05-29 MED ORDER — PREDNISOLONE ACETATE 1 % OP SUSP: 1.0000 [drp] | Freq: Every day | OPHTHALMIC | 0 refills | Status: DC

## 2020-06-02 ENCOUNTER — Other Ambulatory Visit: Payer: Self-pay

## 2020-06-02 ENCOUNTER — Encounter (INDEPENDENT_AMBULATORY_CARE_PROVIDER_SITE_OTHER): Payer: Self-pay | Admitting: Ophthalmology

## 2020-06-02 ENCOUNTER — Ambulatory Visit (INDEPENDENT_AMBULATORY_CARE_PROVIDER_SITE_OTHER): Payer: Medicaid Other | Admitting: Ophthalmology

## 2020-06-02 DIAGNOSIS — I10 Essential (primary) hypertension: Secondary | ICD-10-CM

## 2020-06-02 DIAGNOSIS — Z961 Presence of intraocular lens: Secondary | ICD-10-CM

## 2020-06-02 DIAGNOSIS — H3581 Retinal edema: Secondary | ICD-10-CM | POA: Diagnosis not present

## 2020-06-02 DIAGNOSIS — E113593 Type 2 diabetes mellitus with proliferative diabetic retinopathy without macular edema, bilateral: Secondary | ICD-10-CM

## 2020-06-02 DIAGNOSIS — H35033 Hypertensive retinopathy, bilateral: Secondary | ICD-10-CM

## 2020-06-02 DIAGNOSIS — H4311 Vitreous hemorrhage, right eye: Secondary | ICD-10-CM
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
## 2020-06-19 ENCOUNTER — Ambulatory Visit (INDEPENDENT_AMBULATORY_CARE_PROVIDER_SITE_OTHER): Payer: Medicaid Other | Admitting: Ophthalmology

## 2020-06-19 ENCOUNTER — Encounter (INDEPENDENT_AMBULATORY_CARE_PROVIDER_SITE_OTHER): Payer: Self-pay | Admitting: Ophthalmology

## 2020-06-19 ENCOUNTER — Other Ambulatory Visit: Payer: Self-pay

## 2020-06-19 DIAGNOSIS — H35033 Hypertensive retinopathy, bilateral: Secondary | ICD-10-CM

## 2020-06-19 DIAGNOSIS — H4311 Vitreous hemorrhage, right eye: Secondary | ICD-10-CM

## 2020-06-19 DIAGNOSIS — I10 Essential (primary) hypertension: Secondary | ICD-10-CM | POA: Diagnosis not present

## 2020-06-19 DIAGNOSIS — E113593 Type 2 diabetes mellitus with proliferative diabetic retinopathy without macular edema, bilateral: Secondary | ICD-10-CM

## 2020-06-19 DIAGNOSIS — Z961 Presence of intraocular lens: Secondary | ICD-10-CM

## 2020-06-19 DIAGNOSIS — H3581 Retinal edema: Secondary | ICD-10-CM | POA: Diagnosis not present

## 2020-06-19 MED ORDER — BEVACIZUMAB CHEMO INJECTION 1.25MG/0.05ML SYRINGE FOR KALEIDOSCOPE
1.2500 mg | INTRAVITREAL | Status: AC | PRN
Start: 2020-06-19 — End: 2020-06-19
  Administered 2020-06-19: 1.25 mg via INTRAVITREAL

## 2020-06-19 MED ORDER — PREDNISOLONE ACETATE 1 % OP SUSP: 1.0000 [drp] | Freq: Every day | OPHTHALMIC | 0 refills | Status: DC
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
## 2020-07-17 ENCOUNTER — Ambulatory Visit (INDEPENDENT_AMBULATORY_CARE_PROVIDER_SITE_OTHER): Payer: Medicaid Other | Admitting: Ophthalmology

## 2020-07-17 ENCOUNTER — Other Ambulatory Visit: Payer: Self-pay

## 2020-07-17 ENCOUNTER — Encounter (INDEPENDENT_AMBULATORY_CARE_PROVIDER_SITE_OTHER): Payer: Self-pay | Admitting: Ophthalmology

## 2020-07-17 DIAGNOSIS — H35033 Hypertensive retinopathy, bilateral: Secondary | ICD-10-CM

## 2020-07-17 DIAGNOSIS — Z961 Presence of intraocular lens: Secondary | ICD-10-CM

## 2020-07-17 DIAGNOSIS — H3581 Retinal edema: Secondary | ICD-10-CM

## 2020-07-17 DIAGNOSIS — E113593 Type 2 diabetes mellitus with proliferative diabetic retinopathy without macular edema, bilateral: Secondary | ICD-10-CM

## 2020-07-17 DIAGNOSIS — H4313 Vitreous hemorrhage, bilateral: Secondary | ICD-10-CM | POA: Diagnosis not present

## 2020-07-17 DIAGNOSIS — I10 Essential (primary) hypertension: Secondary | ICD-10-CM | POA: Diagnosis not present

## 2020-07-17 DIAGNOSIS — H4311 Vitreous hemorrhage, right eye: Secondary | ICD-10-CM

## 2020-07-21 ENCOUNTER — Encounter (INDEPENDENT_AMBULATORY_CARE_PROVIDER_SITE_OTHER): Payer: Self-pay | Admitting: Ophthalmology

## 2020-07-22 DIAGNOSIS — H4313 Vitreous hemorrhage, bilateral: Secondary | ICD-10-CM | POA: Diagnosis not present

## 2020-07-22 DIAGNOSIS — E113593 Type 2 diabetes mellitus with proliferative diabetic retinopathy without macular edema, bilateral: Secondary | ICD-10-CM

## 2020-07-22 MED ORDER — BEVACIZUMAB CHEMO INJECTION 1.25MG/0.05ML SYRINGE FOR KALEIDOSCOPE
1.2500 mg | INTRAVITREAL | Status: AC | PRN
  Administered 2020-07-22: 1.25 mg via INTRAVITREAL
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
## 2020-08-14 ENCOUNTER — Encounter (INDEPENDENT_AMBULATORY_CARE_PROVIDER_SITE_OTHER): Payer: Self-pay | Admitting: Ophthalmology

## 2020-08-14 ENCOUNTER — Other Ambulatory Visit: Payer: Self-pay

## 2020-08-14 ENCOUNTER — Ambulatory Visit (INDEPENDENT_AMBULATORY_CARE_PROVIDER_SITE_OTHER): Payer: Medicaid Other | Admitting: Ophthalmology

## 2020-08-14 DIAGNOSIS — H3581 Retinal edema: Secondary | ICD-10-CM

## 2020-08-14 DIAGNOSIS — E113593 Type 2 diabetes mellitus with proliferative diabetic retinopathy without macular edema, bilateral: Secondary | ICD-10-CM

## 2020-08-14 DIAGNOSIS — I10 Essential (primary) hypertension: Secondary | ICD-10-CM

## 2020-08-14 DIAGNOSIS — H35033 Hypertensive retinopathy, bilateral: Secondary | ICD-10-CM

## 2020-08-14 DIAGNOSIS — H4313 Vitreous hemorrhage, bilateral: Secondary | ICD-10-CM

## 2020-08-14 DIAGNOSIS — Z961 Presence of intraocular lens: Secondary | ICD-10-CM

## 2020-08-14 MED ORDER — BEVACIZUMAB CHEMO INJECTION 1.25MG/0.05ML SYRINGE FOR KALEIDOSCOPE
1.2500 mg | INTRAVITREAL | Status: AC | PRN
  Administered 2020-08-14: 1.25 mg via INTRAVITREAL
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
## 2020-09-11 ENCOUNTER — Ambulatory Visit (INDEPENDENT_AMBULATORY_CARE_PROVIDER_SITE_OTHER): Payer: Medicaid Other | Admitting: Ophthalmology

## 2020-09-11 ENCOUNTER — Other Ambulatory Visit: Payer: Self-pay

## 2020-09-11 ENCOUNTER — Encounter (INDEPENDENT_AMBULATORY_CARE_PROVIDER_SITE_OTHER): Payer: Self-pay | Admitting: Ophthalmology

## 2020-09-11 DIAGNOSIS — I10 Essential (primary) hypertension: Secondary | ICD-10-CM | POA: Diagnosis not present

## 2020-09-11 DIAGNOSIS — H4313 Vitreous hemorrhage, bilateral: Secondary | ICD-10-CM | POA: Diagnosis not present

## 2020-09-11 DIAGNOSIS — E113593 Type 2 diabetes mellitus with proliferative diabetic retinopathy without macular edema, bilateral: Secondary | ICD-10-CM | POA: Diagnosis not present

## 2020-09-11 DIAGNOSIS — H35033 Hypertensive retinopathy, bilateral: Secondary | ICD-10-CM

## 2020-09-11 DIAGNOSIS — H3581 Retinal edema: Secondary | ICD-10-CM | POA: Diagnosis not present

## 2020-09-11 DIAGNOSIS — Z961 Presence of intraocular lens: Secondary | ICD-10-CM

## 2020-09-11 MED ORDER — BEVACIZUMAB CHEMO INJECTION 1.25MG/0.05ML SYRINGE FOR KALEIDOSCOPE
1.2500 mg | INTRAVITREAL | Status: AC | PRN
  Administered 2020-09-11: 08:00:00 1.25 mg via INTRAVITREAL
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
## 2020-10-09 ENCOUNTER — Encounter (INDEPENDENT_AMBULATORY_CARE_PROVIDER_SITE_OTHER): Payer: Self-pay | Admitting: Ophthalmology

## 2020-10-09 ENCOUNTER — Ambulatory Visit (INDEPENDENT_AMBULATORY_CARE_PROVIDER_SITE_OTHER): Payer: Medicaid Other | Admitting: Ophthalmology

## 2020-10-09 ENCOUNTER — Other Ambulatory Visit: Payer: Self-pay

## 2020-10-09 DIAGNOSIS — H3581 Retinal edema: Secondary | ICD-10-CM

## 2020-10-09 DIAGNOSIS — I10 Essential (primary) hypertension: Secondary | ICD-10-CM

## 2020-10-09 DIAGNOSIS — H4313 Vitreous hemorrhage, bilateral: Secondary | ICD-10-CM | POA: Diagnosis not present

## 2020-10-09 DIAGNOSIS — Z961 Presence of intraocular lens: Secondary | ICD-10-CM

## 2020-10-09 DIAGNOSIS — E113593 Type 2 diabetes mellitus with proliferative diabetic retinopathy without macular edema, bilateral: Secondary | ICD-10-CM

## 2020-10-09 DIAGNOSIS — H35033 Hypertensive retinopathy, bilateral: Secondary | ICD-10-CM

## 2020-10-09 MED ORDER — BEVACIZUMAB CHEMO INJECTION 1.25MG/0.05ML SYRINGE FOR KALEIDOSCOPE
1.2500 mg | INTRAVITREAL | Status: AC | PRN
  Administered 2020-10-09: 1.25 mg via INTRAVITREAL
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
## 2020-11-06 ENCOUNTER — Other Ambulatory Visit: Payer: Self-pay

## 2020-11-06 ENCOUNTER — Ambulatory Visit (INDEPENDENT_AMBULATORY_CARE_PROVIDER_SITE_OTHER): Payer: Medicaid Other | Admitting: Ophthalmology

## 2020-11-06 ENCOUNTER — Encounter (INDEPENDENT_AMBULATORY_CARE_PROVIDER_SITE_OTHER): Payer: Self-pay | Admitting: Ophthalmology

## 2020-11-06 DIAGNOSIS — Z961 Presence of intraocular lens: Secondary | ICD-10-CM

## 2020-11-06 DIAGNOSIS — H3581 Retinal edema: Secondary | ICD-10-CM | POA: Diagnosis not present

## 2020-11-06 DIAGNOSIS — H4311 Vitreous hemorrhage, right eye: Secondary | ICD-10-CM

## 2020-11-06 DIAGNOSIS — E113593 Type 2 diabetes mellitus with proliferative diabetic retinopathy without macular edema, bilateral: Secondary | ICD-10-CM | POA: Diagnosis not present

## 2020-11-06 DIAGNOSIS — I10 Essential (primary) hypertension: Secondary | ICD-10-CM | POA: Diagnosis not present

## 2020-11-06 DIAGNOSIS — H4313 Vitreous hemorrhage, bilateral: Secondary | ICD-10-CM

## 2020-11-06 DIAGNOSIS — H35033 Hypertensive retinopathy, bilateral: Secondary | ICD-10-CM | POA: Diagnosis not present

## 2020-11-11 ENCOUNTER — Encounter (INDEPENDENT_AMBULATORY_CARE_PROVIDER_SITE_OTHER): Payer: Self-pay | Admitting: Ophthalmology

## 2020-11-11 MED ORDER — BEVACIZUMAB CHEMO INJECTION 1.25MG/0.05ML SYRINGE FOR KALEIDOSCOPE
1.2500 mg | INTRAVITREAL | Status: AC | PRN
  Administered 2020-11-06: 1.25 mg via INTRAVITREAL

## 2020-12-09 ENCOUNTER — Other Ambulatory Visit: Payer: Self-pay | Admitting: Vascular Surgery

## 2020-12-09 DIAGNOSIS — Z0000 Encounter for general adult medical examination without abnormal findings: Secondary | ICD-10-CM

## 2020-12-09 DIAGNOSIS — Z1231 Encounter for screening mammogram for malignant neoplasm of breast: Secondary | ICD-10-CM

## 2020-12-10 ENCOUNTER — Other Ambulatory Visit: Payer: Self-pay | Admitting: Vascular Surgery

## 2020-12-10 DIAGNOSIS — Z87891 Personal history of nicotine dependence: Secondary | ICD-10-CM
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
## 2020-12-18 ENCOUNTER — Ambulatory Visit (INDEPENDENT_AMBULATORY_CARE_PROVIDER_SITE_OTHER): Payer: Medicaid Other | Admitting: Ophthalmology

## 2020-12-18 ENCOUNTER — Encounter (INDEPENDENT_AMBULATORY_CARE_PROVIDER_SITE_OTHER): Payer: Self-pay | Admitting: Ophthalmology

## 2020-12-18 ENCOUNTER — Other Ambulatory Visit: Payer: Self-pay

## 2020-12-18 DIAGNOSIS — H35033 Hypertensive retinopathy, bilateral: Secondary | ICD-10-CM | POA: Diagnosis not present

## 2020-12-18 DIAGNOSIS — H4313 Vitreous hemorrhage, bilateral: Secondary | ICD-10-CM

## 2020-12-18 DIAGNOSIS — I10 Essential (primary) hypertension: Secondary | ICD-10-CM | POA: Diagnosis not present

## 2020-12-18 DIAGNOSIS — Z961 Presence of intraocular lens: Secondary | ICD-10-CM

## 2020-12-18 DIAGNOSIS — E113593 Type 2 diabetes mellitus with proliferative diabetic retinopathy without macular edema, bilateral: Secondary | ICD-10-CM

## 2020-12-18 DIAGNOSIS — H3581 Retinal edema: Secondary | ICD-10-CM

## 2020-12-24 ENCOUNTER — Inpatient Hospital Stay: Admission: RE | Admit: 2020-12-24 | Payer: Medicaid Other | Source: Ambulatory Visit

## 2021-01-09 ENCOUNTER — Other Ambulatory Visit: Payer: Self-pay

## 2021-01-09 ENCOUNTER — Ambulatory Visit
Admission: RE | Admit: 2021-01-09 | Discharge: 2021-01-09 | Disposition: A | Discharge status: Home / Self Care | Payer: Medicaid Other | Source: Ambulatory Visit | Attending: Vascular Surgery | Admitting: Vascular Surgery

## 2021-01-09 DIAGNOSIS — Z1231 Encounter for screening mammogram for malignant neoplasm of breast: Secondary | ICD-10-CM

## 2021-01-16 ENCOUNTER — Ambulatory Visit
Admission: RE | Admit: 2021-01-16 | Discharge: 2021-01-16 | Disposition: A | Discharge status: Home / Self Care | Payer: Medicaid Other | Source: Ambulatory Visit | Attending: Vascular Surgery | Admitting: Vascular Surgery

## 2021-01-16 DIAGNOSIS — Z87891 Personal history of nicotine dependence: Secondary | ICD-10-CM
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
## 2021-02-17 ENCOUNTER — Encounter (INDEPENDENT_AMBULATORY_CARE_PROVIDER_SITE_OTHER): Payer: Self-pay | Admitting: Ophthalmology

## 2021-02-17 ENCOUNTER — Ambulatory Visit (INDEPENDENT_AMBULATORY_CARE_PROVIDER_SITE_OTHER): Payer: Medicaid Other | Admitting: Ophthalmology

## 2021-02-17 ENCOUNTER — Other Ambulatory Visit: Payer: Self-pay

## 2021-02-17 DIAGNOSIS — H35033 Hypertensive retinopathy, bilateral: Secondary | ICD-10-CM | POA: Diagnosis not present

## 2021-02-17 DIAGNOSIS — Z961 Presence of intraocular lens: Secondary | ICD-10-CM

## 2021-02-17 DIAGNOSIS — E113593 Type 2 diabetes mellitus with proliferative diabetic retinopathy without macular edema, bilateral: Secondary | ICD-10-CM

## 2021-02-17 DIAGNOSIS — I10 Essential (primary) hypertension: Secondary | ICD-10-CM | POA: Diagnosis not present

## 2021-02-17 DIAGNOSIS — H4313 Vitreous hemorrhage, bilateral: Secondary | ICD-10-CM

## 2021-02-17 DIAGNOSIS — H3581 Retinal edema: Secondary | ICD-10-CM

## 2021-02-19 ENCOUNTER — Encounter (INDEPENDENT_AMBULATORY_CARE_PROVIDER_SITE_OTHER): Payer: Self-pay | Admitting: Ophthalmology

## 2021-03-02 IMAGING — DX DG ANKLE COMPLETE 3+V*R*
3 series · 3 of 3 positions shown · non-contrast
Comparison: None.

CLINICAL DATA: 58-year-old female with a history of shoulder injury

EXAM:
RIGHT ANKLE - COMPLETE 3+ VIEW

[ankle ap]
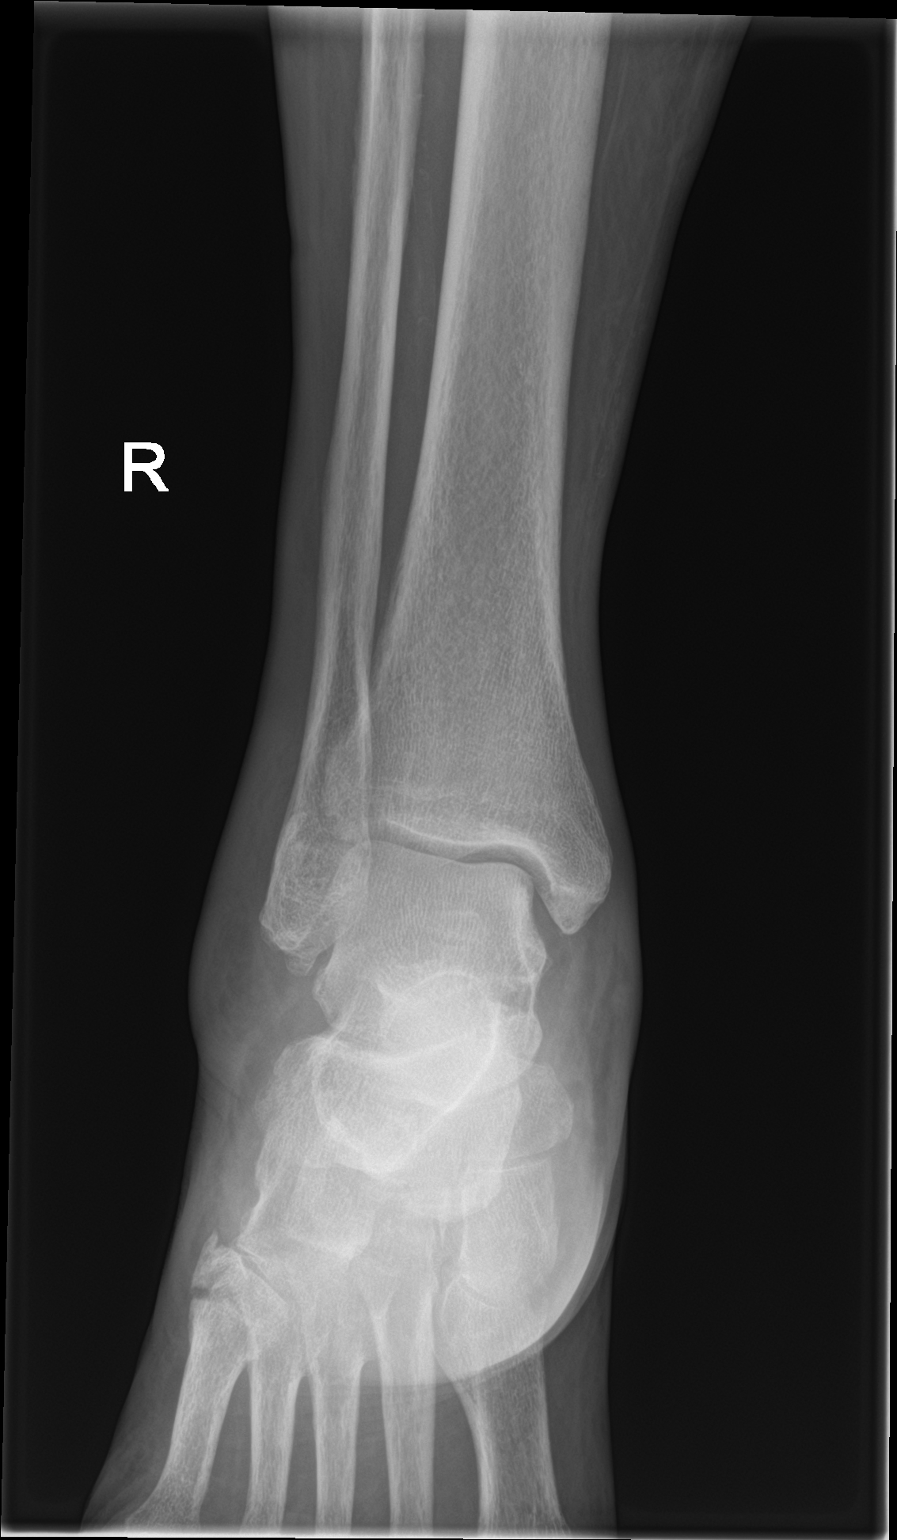

[ankle obl]
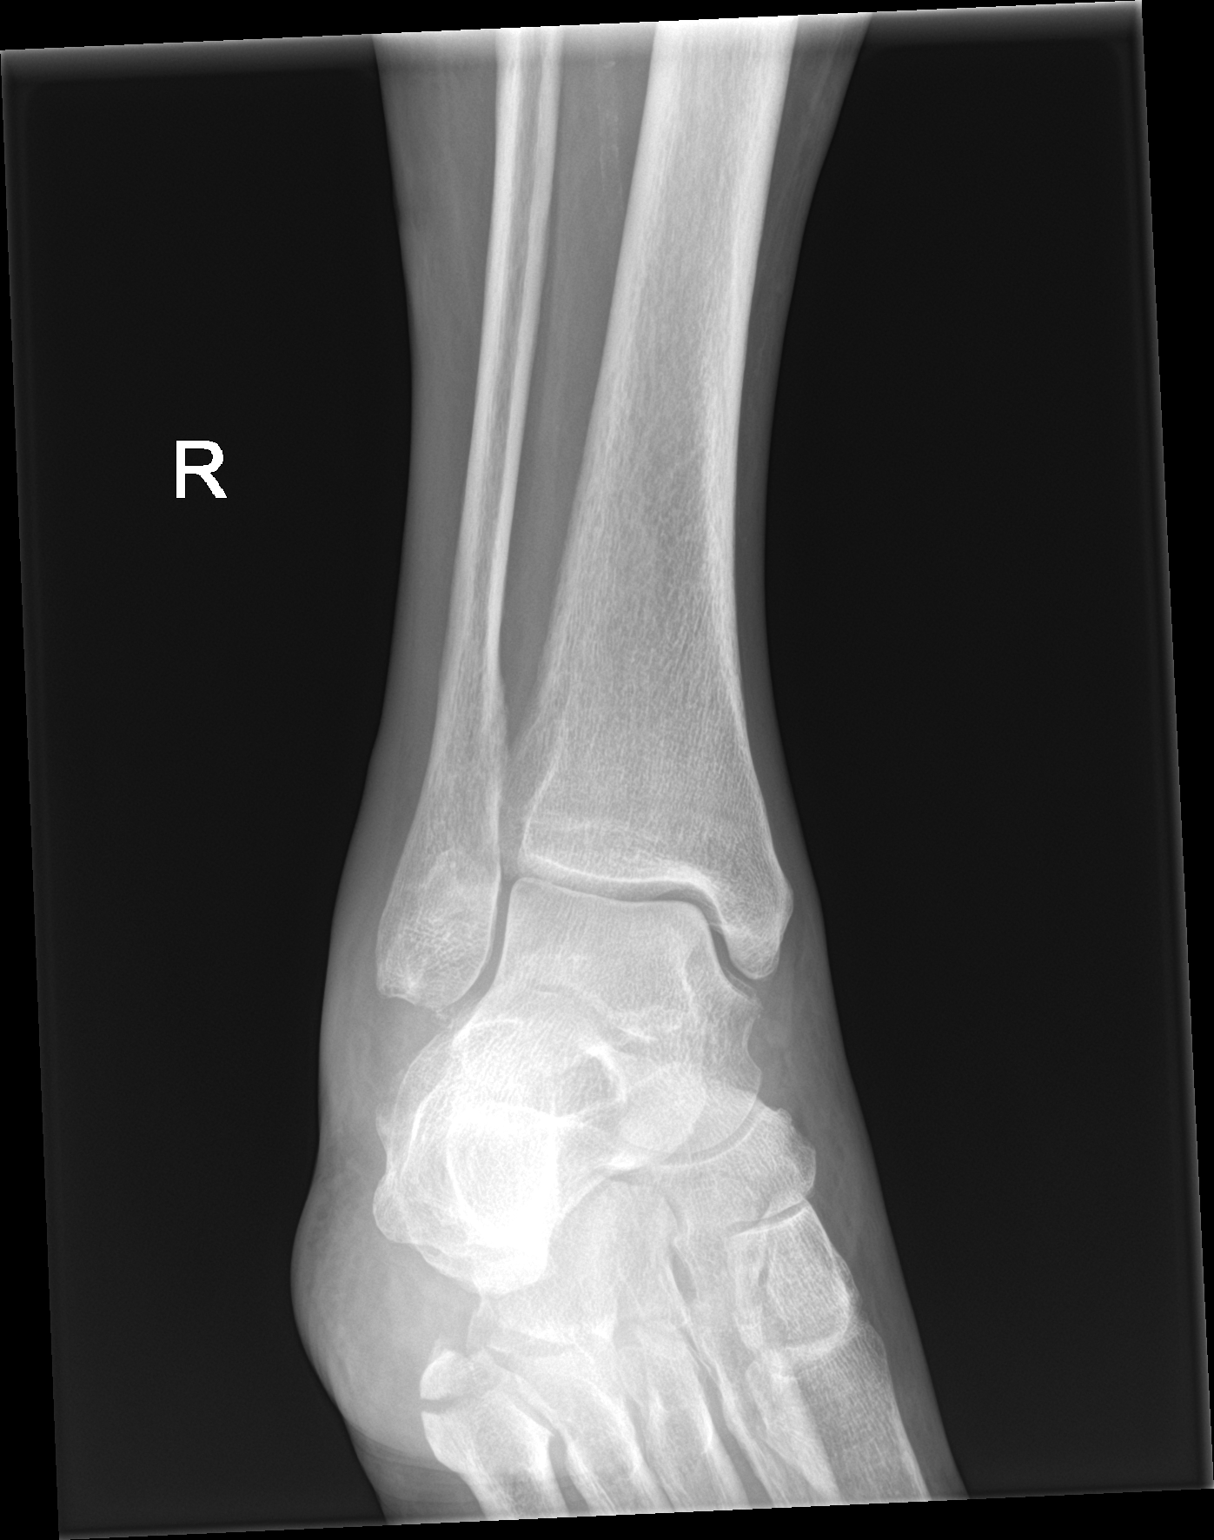

[ankle lat]
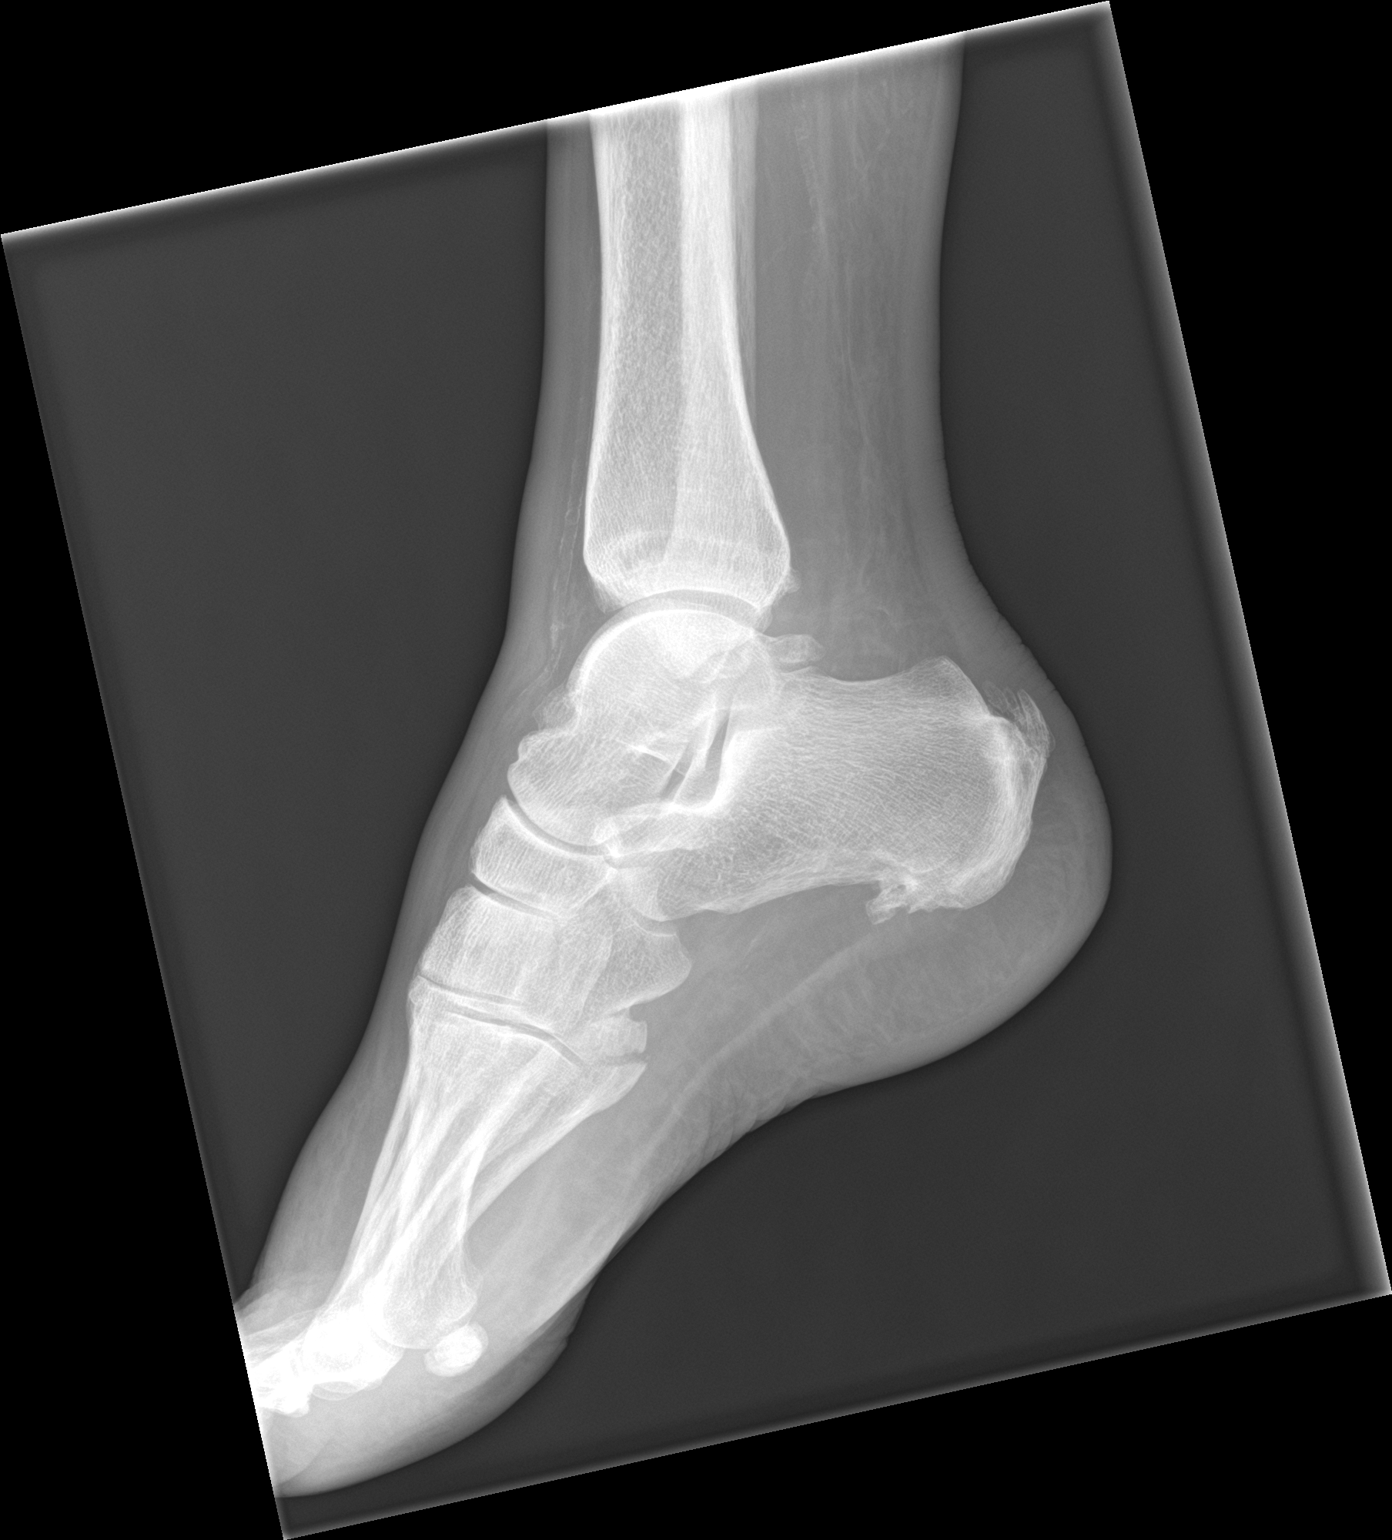

[3 of 3 positions shown; findings below may reference images not displayed]

FINDINGS: Fracture at the base of the fifth metatarsal, incompletely imaged on
the current.

Irregularity at the distal fibula with no comparison. Soft tissue
swelling on the lateral ankle.

Ankle mortise is congruent.

No acute fracture of the tibia.

Degenerative changes of the hindfoot including enthesopathic changes
at the Achilles insertion and plantar fascia insertion.

Atherosclerosis
IMPRESSION: Acute fracture at the base of the fifth metatarsal, with associated
soft tissue swelling.

Irregularity at the distal fibula, may represent chronic changes
versus tiny avulsion fracture.

Degenerative changes.

## 2021-03-02 IMAGING — DX DG FOOT COMPLETE 3+V*R*
3 series · 3 of 3 positions shown · non-contrast
Comparison: None.

CLINICAL DATA: 58-year-old female with a fall

EXAM:
RIGHT FOOT COMPLETE - 3+ VIEW

[foot ap]
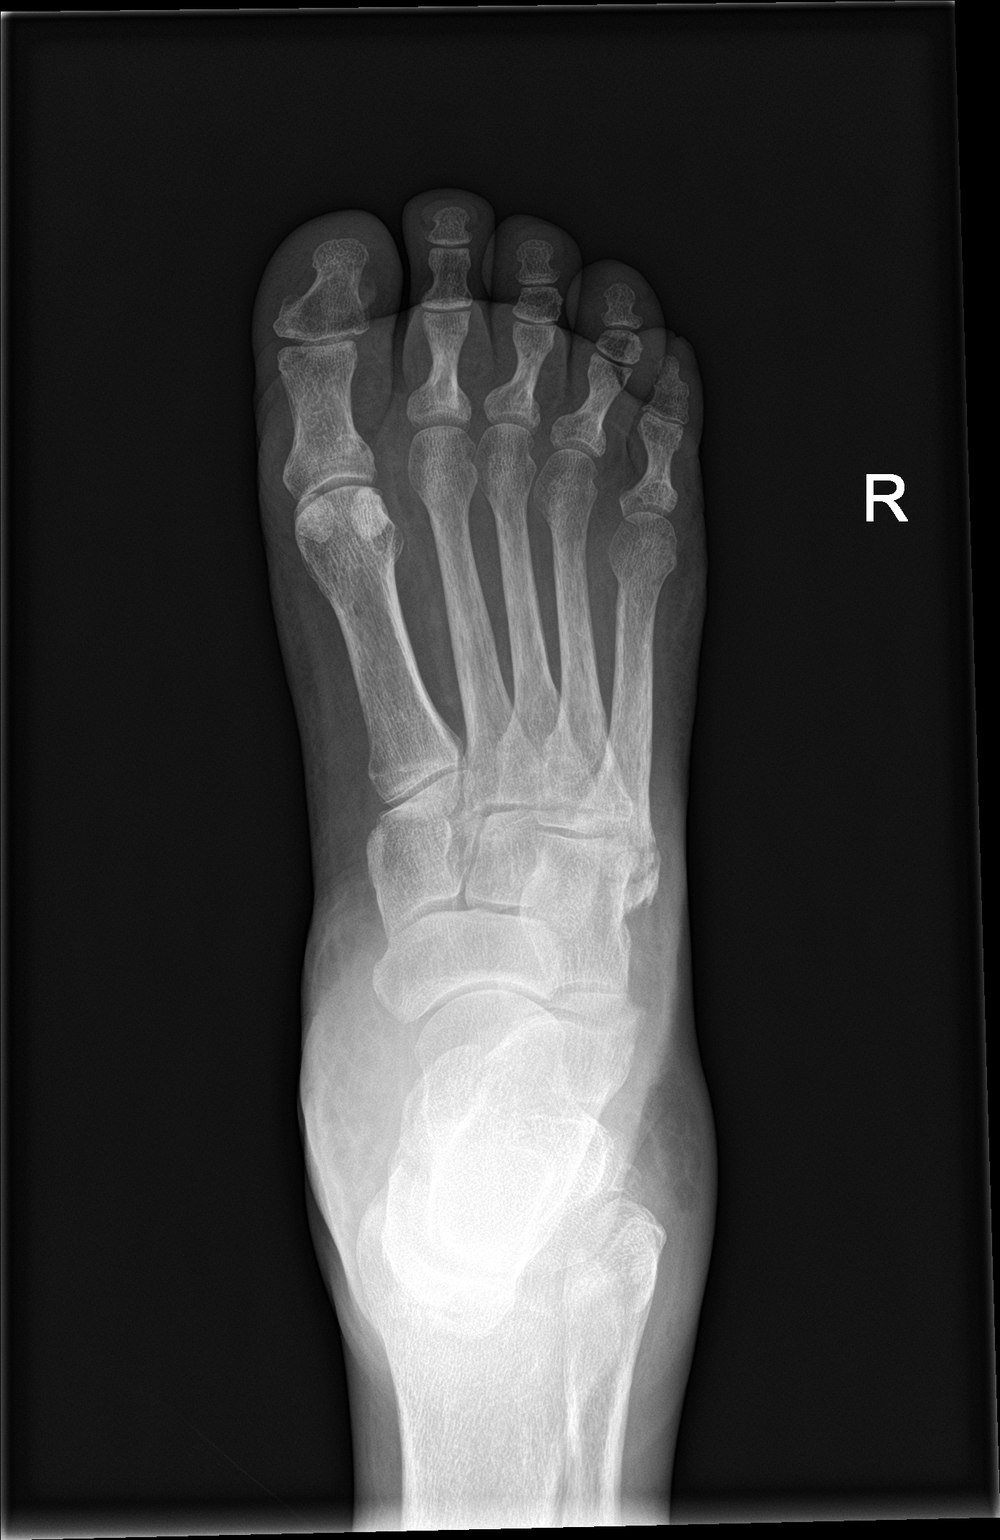

[foot obl]
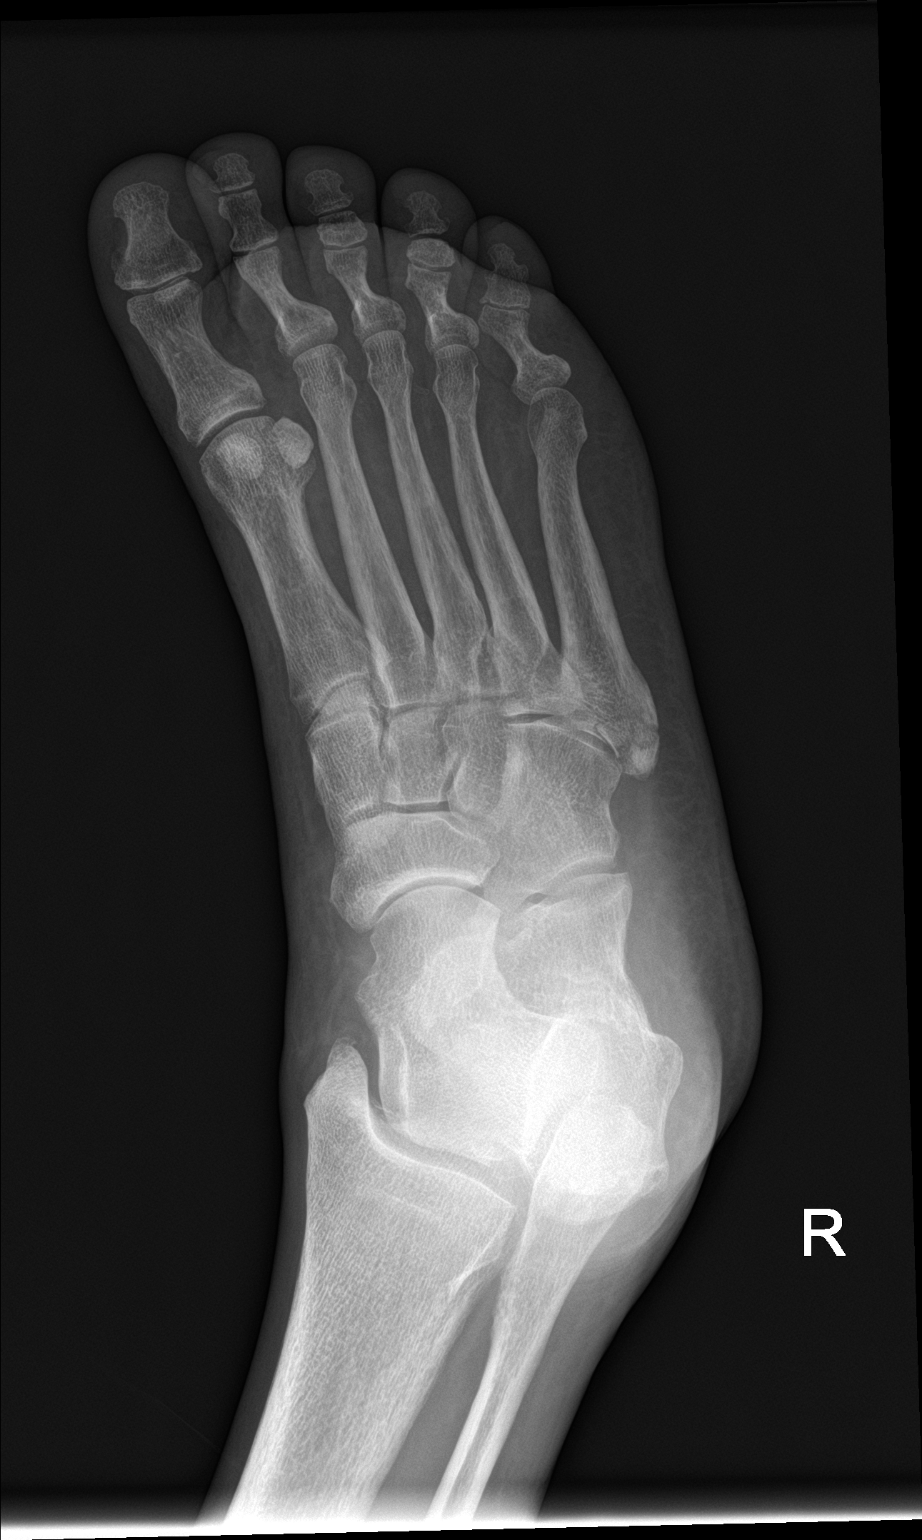

[foot lat]
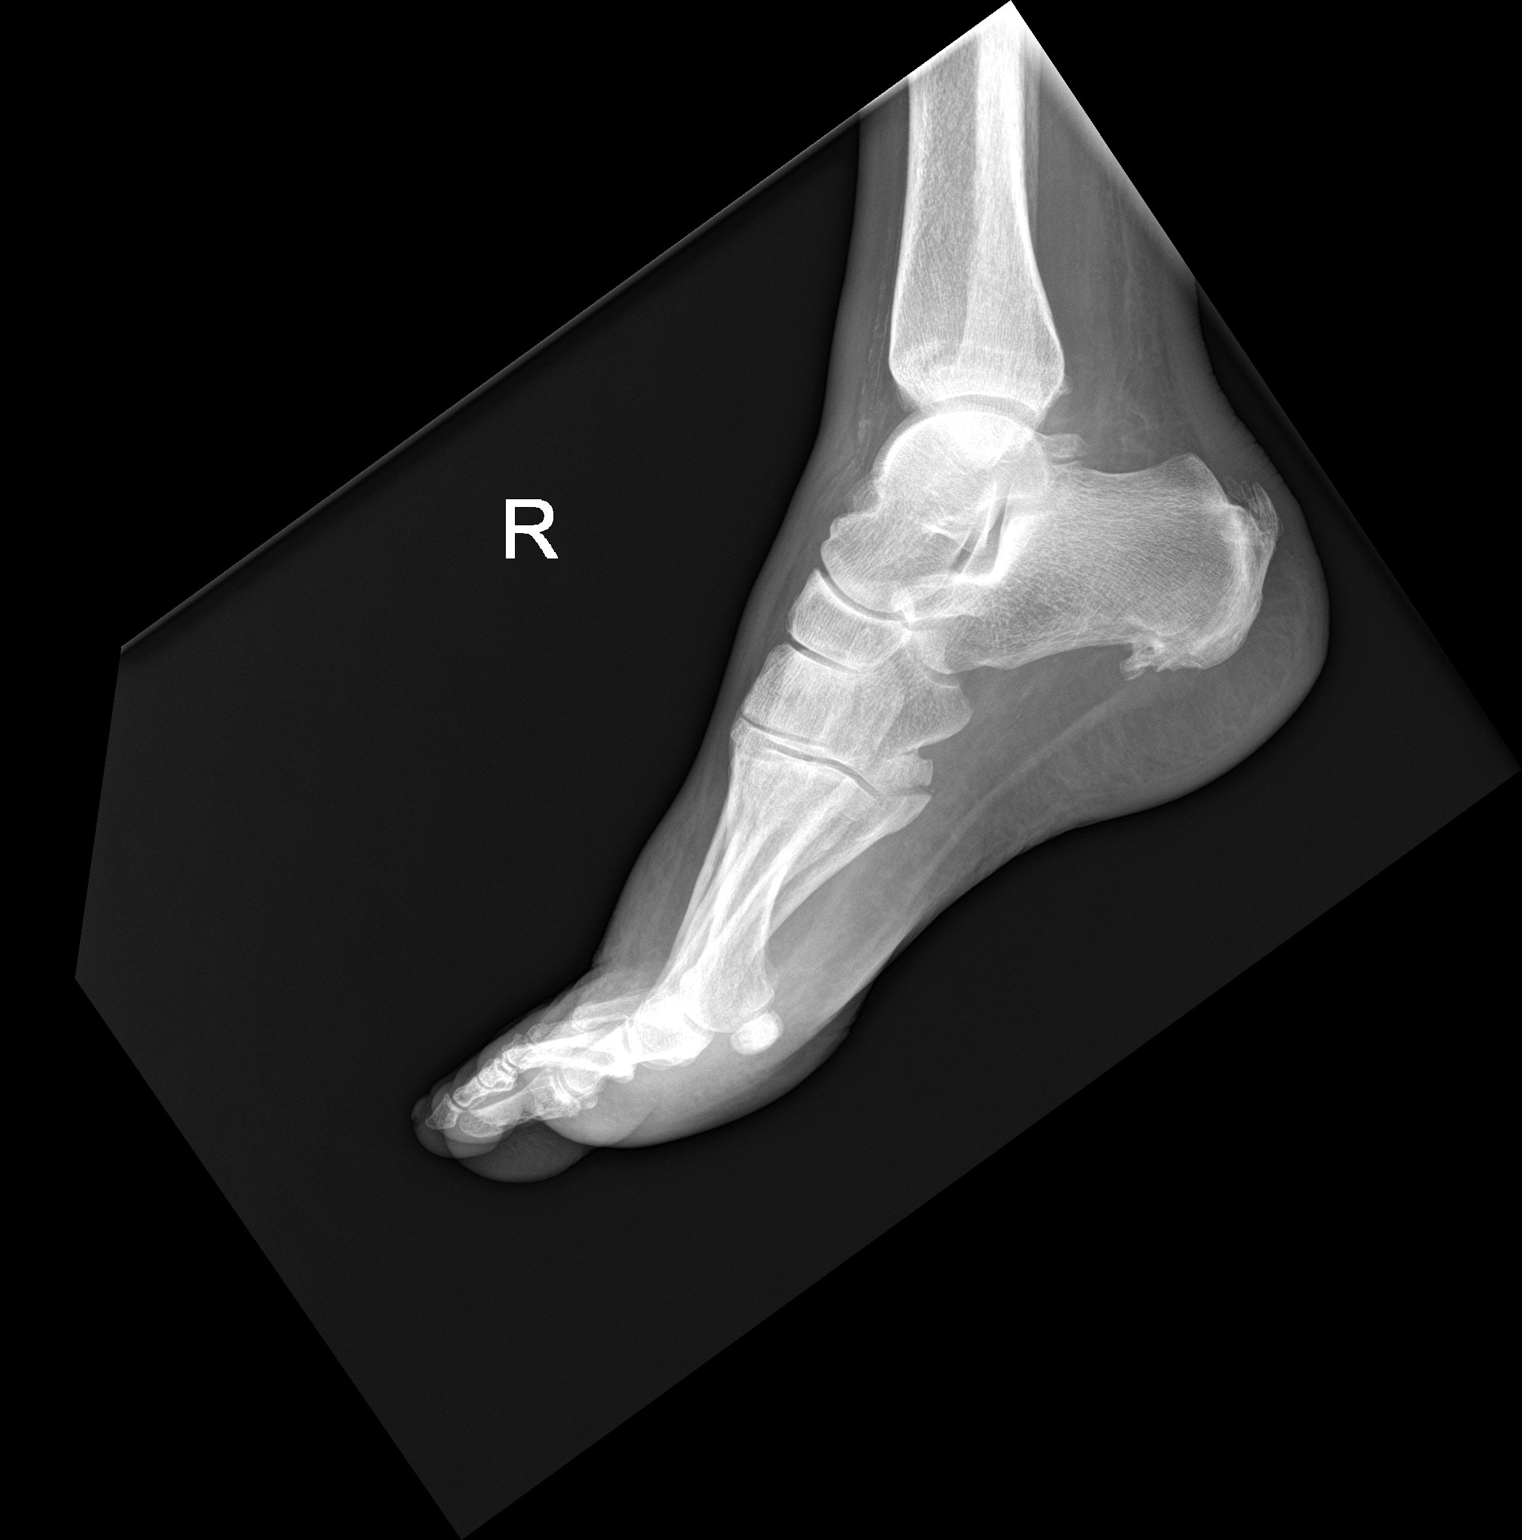

[3 of 3 positions shown; findings below may reference images not displayed]

FINDINGS: Acute fracture at the base of the fifth metatarsal with associated
soft tissue swelling.

Degenerative changes of the hindfoot, including enthesopathic
changes at the Achilles insertion and the plantar fascia insertion.
Mild degenerative changes of the interphalangeal joints.
IMPRESSION: Acute fracture at the base of the fifth metatarsal. Associated soft
tissue swelling

## 2021-05-15 ENCOUNTER — Other Ambulatory Visit: Payer: Self-pay | Admitting: Vascular Surgery

## 2021-05-15 DIAGNOSIS — Z1231 Encounter for screening mammogram for malignant neoplasm of breast: Secondary | ICD-10-CM
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
## 2021-05-26 ENCOUNTER — Encounter (INDEPENDENT_AMBULATORY_CARE_PROVIDER_SITE_OTHER): Payer: Medicaid Other | Admitting: Ophthalmology

## 2021-05-26 DIAGNOSIS — I10 Essential (primary) hypertension: Secondary | ICD-10-CM

## 2021-05-26 DIAGNOSIS — H4313 Vitreous hemorrhage, bilateral: Secondary | ICD-10-CM

## 2021-05-26 DIAGNOSIS — E113593 Type 2 diabetes mellitus with proliferative diabetic retinopathy without macular edema, bilateral: Secondary | ICD-10-CM

## 2021-05-26 DIAGNOSIS — Z961 Presence of intraocular lens: Secondary | ICD-10-CM

## 2021-05-26 DIAGNOSIS — H35033 Hypertensive retinopathy, bilateral: Secondary | ICD-10-CM
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
## 2021-08-04 ENCOUNTER — Ambulatory Visit (INDEPENDENT_AMBULATORY_CARE_PROVIDER_SITE_OTHER): Payer: Medicaid Other | Admitting: Ophthalmology

## 2021-08-04 ENCOUNTER — Encounter (INDEPENDENT_AMBULATORY_CARE_PROVIDER_SITE_OTHER): Payer: Self-pay | Admitting: Ophthalmology

## 2021-08-04 DIAGNOSIS — H35033 Hypertensive retinopathy, bilateral: Secondary | ICD-10-CM | POA: Diagnosis not present

## 2021-08-04 DIAGNOSIS — E113593 Type 2 diabetes mellitus with proliferative diabetic retinopathy without macular edema, bilateral: Secondary | ICD-10-CM

## 2021-08-04 DIAGNOSIS — H4313 Vitreous hemorrhage, bilateral: Secondary | ICD-10-CM

## 2021-08-04 DIAGNOSIS — I10 Essential (primary) hypertension: Secondary | ICD-10-CM | POA: Diagnosis not present

## 2021-08-04 DIAGNOSIS — Z961 Presence of intraocular lens: Secondary | ICD-10-CM

## 2021-08-06 ENCOUNTER — Encounter (INDEPENDENT_AMBULATORY_CARE_PROVIDER_SITE_OTHER): Payer: Self-pay | Admitting: Ophthalmology

## 2021-11-23 ENCOUNTER — Other Ambulatory Visit: Payer: Self-pay | Admitting: Vascular Surgery

## 2021-11-23 DIAGNOSIS — J432 Centrilobular emphysema: Secondary | ICD-10-CM

## 2021-12-16 ENCOUNTER — Other Ambulatory Visit: Payer: Medicaid Other

## 2022-01-11 ENCOUNTER — Ambulatory Visit
Admission: RE | Admit: 2022-01-11 | Discharge: 2022-01-11 | Disposition: A | Discharge status: Home / Self Care | Payer: Medicaid Other | Source: Ambulatory Visit | Attending: Vascular Surgery | Admitting: Vascular Surgery

## 2022-01-11 DIAGNOSIS — Z1231 Encounter for screening mammogram for malignant neoplasm of breast: Secondary | ICD-10-CM

## 2022-01-29 ENCOUNTER — Other Ambulatory Visit: Payer: Medicaid Other
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
## 2022-02-08 ENCOUNTER — Encounter (INDEPENDENT_AMBULATORY_CARE_PROVIDER_SITE_OTHER): Payer: Self-pay | Admitting: Ophthalmology

## 2022-02-08 ENCOUNTER — Ambulatory Visit (INDEPENDENT_AMBULATORY_CARE_PROVIDER_SITE_OTHER): Payer: Medicaid Other | Admitting: Ophthalmology

## 2022-02-08 DIAGNOSIS — I10 Essential (primary) hypertension: Secondary | ICD-10-CM

## 2022-02-08 DIAGNOSIS — E113593 Type 2 diabetes mellitus with proliferative diabetic retinopathy without macular edema, bilateral: Secondary | ICD-10-CM | POA: Diagnosis not present

## 2022-02-08 DIAGNOSIS — H4313 Vitreous hemorrhage, bilateral: Secondary | ICD-10-CM | POA: Diagnosis not present

## 2022-02-08 DIAGNOSIS — H35033 Hypertensive retinopathy, bilateral: Secondary | ICD-10-CM | POA: Diagnosis not present

## 2022-02-08 DIAGNOSIS — Z961 Presence of intraocular lens: Secondary | ICD-10-CM

## 2022-03-05 ENCOUNTER — Inpatient Hospital Stay: Admission: RE | Admit: 2022-03-05 | Payer: Medicaid Other | Source: Ambulatory Visit

## 2022-04-02 ENCOUNTER — Inpatient Hospital Stay: Admission: RE | Admit: 2022-04-02 | Payer: Medicaid Other | Source: Ambulatory Visit

## 2022-05-24 ENCOUNTER — Other Ambulatory Visit: Payer: Medicaid Other

## 2022-06-07 ENCOUNTER — Ambulatory Visit
Admission: RE | Admit: 2022-06-07 | Discharge: 2022-06-07 | Disposition: A | Discharge status: Home / Self Care | Payer: Medicaid Other | Source: Ambulatory Visit | Attending: Vascular Surgery | Admitting: Vascular Surgery

## 2022-06-07 DIAGNOSIS — J432 Centrilobular emphysema: Secondary | ICD-10-CM

## 2022-08-02 ENCOUNTER — Encounter (INDEPENDENT_AMBULATORY_CARE_PROVIDER_SITE_OTHER): Payer: Medicaid Other | Admitting: Ophthalmology

## 2022-08-02 DIAGNOSIS — Z961 Presence of intraocular lens: Secondary | ICD-10-CM

## 2022-08-02 DIAGNOSIS — Z7984 Long term (current) use of oral hypoglycemic drugs: Secondary | ICD-10-CM

## 2022-08-02 DIAGNOSIS — E113593 Type 2 diabetes mellitus with proliferative diabetic retinopathy without macular edema, bilateral: Secondary | ICD-10-CM

## 2022-08-02 DIAGNOSIS — H35033 Hypertensive retinopathy, bilateral: Secondary | ICD-10-CM

## 2022-08-02 DIAGNOSIS — H4313 Vitreous hemorrhage, bilateral: Secondary | ICD-10-CM

## 2022-08-02 DIAGNOSIS — I10 Essential (primary) hypertension: Secondary | ICD-10-CM

## 2022-08-02 DIAGNOSIS — Z794 Long term (current) use of insulin: Secondary | ICD-10-CM

## 2022-09-10 ENCOUNTER — Other Ambulatory Visit: Payer: Self-pay | Admitting: Vascular Surgery

## 2022-09-10 DIAGNOSIS — Z1231 Encounter for screening mammogram for malignant neoplasm of breast: Secondary | ICD-10-CM
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
## 2022-10-04 ENCOUNTER — Ambulatory Visit (INDEPENDENT_AMBULATORY_CARE_PROVIDER_SITE_OTHER): Payer: Medicaid Other | Admitting: Ophthalmology

## 2022-10-04 ENCOUNTER — Encounter (INDEPENDENT_AMBULATORY_CARE_PROVIDER_SITE_OTHER): Payer: Self-pay | Admitting: Ophthalmology

## 2022-10-04 DIAGNOSIS — I10 Essential (primary) hypertension: Secondary | ICD-10-CM

## 2022-10-04 DIAGNOSIS — E113593 Type 2 diabetes mellitus with proliferative diabetic retinopathy without macular edema, bilateral: Secondary | ICD-10-CM

## 2022-10-04 DIAGNOSIS — Z961 Presence of intraocular lens: Secondary | ICD-10-CM

## 2022-10-04 DIAGNOSIS — H4313 Vitreous hemorrhage, bilateral: Secondary | ICD-10-CM

## 2022-10-04 DIAGNOSIS — H35033 Hypertensive retinopathy, bilateral: Secondary | ICD-10-CM
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
## 2022-11-01 ENCOUNTER — Ambulatory Visit (INDEPENDENT_AMBULATORY_CARE_PROVIDER_SITE_OTHER): Payer: Medicaid Other | Admitting: Ophthalmology

## 2022-11-01 ENCOUNTER — Encounter (INDEPENDENT_AMBULATORY_CARE_PROVIDER_SITE_OTHER): Payer: Self-pay | Admitting: Ophthalmology

## 2022-11-01 DIAGNOSIS — I10 Essential (primary) hypertension: Secondary | ICD-10-CM | POA: Diagnosis not present

## 2022-11-01 DIAGNOSIS — Z961 Presence of intraocular lens: Secondary | ICD-10-CM

## 2022-11-01 DIAGNOSIS — E113593 Type 2 diabetes mellitus with proliferative diabetic retinopathy without macular edema, bilateral: Secondary | ICD-10-CM | POA: Diagnosis not present

## 2022-11-01 DIAGNOSIS — H4313 Vitreous hemorrhage, bilateral: Secondary | ICD-10-CM

## 2022-11-01 DIAGNOSIS — H35033 Hypertensive retinopathy, bilateral: Secondary | ICD-10-CM

## 2022-11-01 MED ORDER — PREDNISOLONE ACETATE 1 % OP SUSP: 1.0000 [drp] | Freq: Four times a day (QID) | OPHTHALMIC | 0 refills | Status: AC

## 2022-11-01 MED ORDER — PREDNISOLONE ACETATE 1 % OP SUSP: 1.0000 [drp] | Freq: Four times a day (QID) | OPHTHALMIC | 0 refills | Status: DC

## 2023-01-14 ENCOUNTER — Ambulatory Visit: Payer: Medicaid Other

## 2023-04-18 ENCOUNTER — Encounter (INDEPENDENT_AMBULATORY_CARE_PROVIDER_SITE_OTHER): Payer: Self-pay | Admitting: Ophthalmology

## 2023-04-18 ENCOUNTER — Ambulatory Visit (INDEPENDENT_AMBULATORY_CARE_PROVIDER_SITE_OTHER): Payer: Medicaid Other | Admitting: Ophthalmology

## 2023-04-18 DIAGNOSIS — E113593 Type 2 diabetes mellitus with proliferative diabetic retinopathy without macular edema, bilateral: Secondary | ICD-10-CM | POA: Diagnosis not present

## 2023-04-18 DIAGNOSIS — I10 Essential (primary) hypertension: Secondary | ICD-10-CM

## 2023-04-18 DIAGNOSIS — H4313 Vitreous hemorrhage, bilateral: Secondary | ICD-10-CM

## 2023-04-18 DIAGNOSIS — Z961 Presence of intraocular lens: Secondary | ICD-10-CM

## 2023-04-18 DIAGNOSIS — H35033 Hypertensive retinopathy, bilateral: Secondary | ICD-10-CM
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
## 2023-04-19 ENCOUNTER — Emergency Department (HOSPITAL_COMMUNITY): Payer: Medicaid Other

## 2023-04-19 ENCOUNTER — Encounter (HOSPITAL_COMMUNITY): Payer: Self-pay

## 2023-04-19 ENCOUNTER — Other Ambulatory Visit: Payer: Self-pay

## 2023-04-19 ENCOUNTER — Emergency Department (HOSPITAL_COMMUNITY)
Admission: EM | Admit: 2023-04-19 | Discharge: 2023-04-19 | Disposition: A | Discharge status: Home / Self Care | Payer: Medicaid Other | Attending: Emergency Medicine | Admitting: Emergency Medicine

## 2023-04-19 DIAGNOSIS — Z7984 Long term (current) use of oral hypoglycemic drugs: Secondary | ICD-10-CM | POA: Insufficient documentation

## 2023-04-19 DIAGNOSIS — Z79899 Other long term (current) drug therapy: Secondary | ICD-10-CM | POA: Diagnosis not present

## 2023-04-19 DIAGNOSIS — Z23 Encounter for immunization: Secondary | ICD-10-CM | POA: Diagnosis not present

## 2023-04-19 DIAGNOSIS — S0990XA Unspecified injury of head, initial encounter: Secondary | ICD-10-CM | POA: Insufficient documentation

## 2023-04-19 DIAGNOSIS — W01198A Fall on same level from slipping, tripping and stumbling with subsequent striking against other object, initial encounter: Secondary | ICD-10-CM | POA: Diagnosis not present

## 2023-04-19 DIAGNOSIS — S0101XA Laceration without foreign body of scalp, initial encounter: Secondary | ICD-10-CM | POA: Insufficient documentation

## 2023-04-19 DIAGNOSIS — Y92009 Unspecified place in unspecified non-institutional (private) residence as the place of occurrence of the external cause: Secondary | ICD-10-CM | POA: Insufficient documentation

## 2023-04-19 DIAGNOSIS — W19XXXA Unspecified fall, initial encounter: Secondary | ICD-10-CM

## 2023-04-19 DIAGNOSIS — Z794 Long term (current) use of insulin: Secondary | ICD-10-CM | POA: Insufficient documentation

## 2023-04-19 DIAGNOSIS — Z7982 Long term (current) use of aspirin: Secondary | ICD-10-CM | POA: Diagnosis not present

## 2023-04-19 MED ORDER — LIDOCAINE-EPINEPHRINE (PF) 2 %-1:200000 IJ SOLN
10.0000 mL | Freq: Once | INTRAMUSCULAR | Status: AC
  Administered 2023-04-19: 10 mL
  Filled 2023-04-19: qty 20

## 2023-04-19 MED ORDER — TETANUS-DIPHTH-ACELL PERTUSSIS 5-2.5-18.5 LF-MCG/0.5 IM SUSY
0.5000 mL | PREFILLED_SYRINGE | Freq: Once | INTRAMUSCULAR | Status: AC
  Administered 2023-04-19: 0.5 mL via INTRAMUSCULAR
  Filled 2023-04-19: qty 0.5

## 2023-04-19 NOTE — ED Triage Notes (Signed)
Pt BIB [facility] from [facility] for a fall at home. Lac and abrasion to crown of head. No thinners. Pt A&O x4. VSS.  142/90 60 HR 134 cbg

## 2023-04-19 NOTE — ED Provider Notes (Signed)
 [place] EMERGENC[facility] DEPARTMENT AT [facility] Provider Note   CSN: [id number] Arrival date & time: 04/19/23  1527     History  Chief Complaint  Patient presents with   Anna Wagner is a 63 y.o. female.  Patient tripped, falling forward and hitting her head on the door. No LOC, nausea. She reports scalp laceration to crown. No posterior neck pain. No chest pain, dizziness. On Aspirin  only in terms of anticoagulation.   The history is provided by the patient. No language interpreter was used.  Fall       Home Medications Prior to Admission medications   Medication Sig Start Date End Date Taking? Authorizing Provider  amLODipine-valsartan (EXFORGE) 10-320 MG tablet Take 1 tablet by mouth daily.    [provider]  aspirin  EC 81 MG tablet Take 81 mg by mouth at bedtime.     [provider]  atorvastatin  (LIPITOR) 20 MG tablet Take 20 mg by mouth daily.    [provider]  bacitracin -polymyxin b  (POLYSPORIN ) ophthalmic ointment Place 1 application into the right eye 4 (four) times daily. apply to eye every 12 hours while awake Patient not taking: Reported on 04/18/2023    [provider]  brimonidine  (ALPHAGAN ) 0.15 % ophthalmic solution Place 1 drop into the right eye 2 (two) times daily. Patient not taking: Reported on 04/18/2023    [provider]  Cholecalciferol (VITAMIN D) 50 MCG (2000 UT) tablet Take 2,000 Units by mouth daily.    [provider]  Dulaglutide (TRULICITY) 1.5 MG/0.5ML SOPN Inject 1.5 mg into the skin once a week.    [provider]  DULoxetine  (CYMBALTA ) 60 MG capsule Take 1 capsule (60 mg total) by mouth daily. 05/01/18   Newlin, Enobong, MD  erythromycin  ophthalmic ointment Place 1 application into both eyes as needed. Patient not taking: Reported on 04/18/2023 12/10/19   Valdemar Rogue, MD  gatifloxacin  (ZYMAXID ) 0.5 % SOLN Place 1 drop into the right eye 4 (four) times  daily. Patient not taking: Reported on 04/18/2023    [provider]  hydrALAZINE (APRESOLINE) 50 MG tablet Take 50 mg by mouth 3 (three) times daily.    [provider]  insulin  glargine (LANTUS ) 100 UNIT/ML injection Inject 100 Units into the skin every morning.    [provider]  Insulin  Syringe-Needle U-100 (TRUEPLUS INSULIN  SYRINGE) 30G X 5/16 1 ML MISC Use as directed 04/13/17   Newlin, Enobong, MD  liraglutide  (VICTOZA ) 18 MG/3ML SOPN Inject 18 mg into the skin every morning. Patient not taking: Reported on 12/18/2020    [provider]  loperamide (IMODIUM) 2 MG capsule Take 2 mg by mouth daily.    [provider]  metFORMIN  (GLUCOPHAGE ) 1000 MG tablet TAKE 1 TABLET BY MOUTH TWICE DAILY WITH A MEAL 05/01/18   Newlin, Enobong, MD  metoprolol  succinate (TOPROL -XL) 100 MG 24 hr tablet Take 100 mg by mouth daily. Take with or immediately following a meal.    [provider]  Omega-3 1000 MG CAPS Take 1,000 mg by mouth in the morning and at bedtime.    [provider]  pregabalin  (LYRICA ) 100 MG capsule Take 100 mg by mouth in the morning, at noon, and at bedtime.    [provider]  traZODone (DESYREL) 100 MG tablet Take 200 mg by mouth at bedtime.    [provider]  XIIDRA 5 % SOLN Place 1 drop into both eyes in the morning  and at bedtime. Patient not taking: Reported on 04/18/2023 04/02/20   [provider]      Allergies    Patient has no known allergies.    Review of Systems   Review of Systems  Physical Exam Updated Vital Signs BP (!) 174/68   Pulse 62   Temp 97.9 F (36.6 C) (Oral)   Resp 17   Ht 5' 4 (1.626 m)   Wt 88.5 kg   LMP  (LMP Unknown)   SpO2 94%   BMI 33.47 kg/m  Physical Exam Constitutional:      Appearance: She is well-developed.  HENT:     Head: Normocephalic.  Cardiovascular:     Rate and Rhythm: Normal rate.  Pulmonary:     Effort: Pulmonary effort is normal.      Breath sounds: No rhonchi.  Chest:     Chest wall: No tenderness.  Abdominal:     Palpations: Abdomen is soft.     Tenderness: There is no abdominal tenderness. There is no guarding or rebound.  Musculoskeletal:        General: Normal range of motion.     Cervical back: Normal range of motion and neck supple.  Skin:    General: Skin is warm and dry.  Neurological:     General: No focal deficit present.     Mental Status: She is alert and oriented to person, place, and time.     GCS: GCS eye subscore is 4. GCS verbal subscore is 5. GCS motor subscore is 6.     Cranial Nerves: No cranial nerve deficit, dysarthria or facial asymmetry.     Sensory: Sensation is intact.     Motor: No weakness or abnormal muscle tone.     Coordination: Coordination is intact.     Gait: Gait is intact.     ED Results / Procedures / Treatments   Labs (all labs ordered are listed, but only abnormal results are displayed) Labs Reviewed - No data to display  EKG None  Radiology CT Head Wo Contrast Result Date: 04/19/2023 CLINICAL DATA:  Head trauma, moderate-severe; Neck trauma, dangerous injury mechanism (Age 73-64y) EXAM: CT HEAD WITHOUT CONTRAST CT CERVICAL SPINE WITHOUT CONTRAST TECHNIQUE: Multidetector CT imaging of the head and cervical spine was performed following the standard protocol without intravenous contrast. Multiplanar CT image reconstructions of the cervical spine were also generated. RADIATION DOSE REDUCTION: This exam was performed according to the departmental dose-optimization program which includes automated exposure control, adjustment of the mA and/or kV according to patient size and/or use of iterative reconstruction technique. COMPARISON:  None Available. FINDINGS: CT HEAD FINDINGS Brain: No evidence of acute infarction, hemorrhage, hydrocephalus, extra-axial collection or mass lesion/mass effect. Vascular: No hyperdense vessel or unexpected calcification. Skull: There is a soft tissue  hematoma the posterior scalp. Skin staples in place. No evidence of underlying calvarial fracture. Sinuses/Orbits: 2 no middle ear or mastoid effusion. Paranasal sinuses are clear. Bilateral lens replacement. Orbits are otherwise unremarkable. Other: None. CT CERVICAL SPINE FINDINGS Alignment: Normal. Skull base and vertebrae: No acute fracture. No primary bone lesion or focal pathologic process. Soft tissues and spinal canal: No prevertebral fluid or swelling. No visible canal hematoma. Disc levels: No evidence of high-grade spinal canal stenosis Upper chest: Negative. Other: None IMPRESSION: 1. No acute intracranial abnormality. 2. Soft tissue hematoma the posterior scalp. No evidence of underlying calvarial fracture. 3. No acute fracture or traumatic subluxation of the cervical spine. Electronically Signed   By: Lyndall  Meade M.D.   On: 04/19/2023 18:22   CT Cervical Spine Wo Contrast Result Date: 04/19/2023 CLINICAL DATA:  Head trauma, moderate-severe; Neck trauma, dangerous injury mechanism (Age 1-64y) EXAM: CT HEAD WITHOUT CONTRAST CT CERVICAL SPINE WITHOUT CONTRAST TECHNIQUE: Multidetector CT imaging of the head and cervical spine was performed following the standard protocol without intravenous contrast. Multiplanar CT image reconstructions of the cervical spine were also generated. RADIATION DOSE REDUCTION: This exam was performed according to the departmental dose-optimization program which includes automated exposure control, adjustment of the mA and/or kV according to patient size and/or use of iterative reconstruction technique. COMPARISON:  None Available. FINDINGS: CT HEAD FINDINGS Brain: No evidence of acute infarction, hemorrhage, hydrocephalus, extra-axial collection or mass lesion/mass effect. Vascular: No hyperdense vessel or unexpected calcification. Skull: There is a soft tissue hematoma the posterior scalp. Skin staples in place. No evidence of underlying calvarial fracture. Sinuses/Orbits:  2 no middle ear or mastoid effusion. Paranasal sinuses are clear. Bilateral lens replacement. Orbits are otherwise unremarkable. Other: None. CT CERVICAL SPINE FINDINGS Alignment: Normal. Skull base and vertebrae: No acute fracture. No primary bone lesion or focal pathologic process. Soft tissues and spinal canal: No prevertebral fluid or swelling. No visible canal hematoma. Disc levels: No evidence of high-grade spinal canal stenosis Upper chest: Negative. Other: None IMPRESSION: 1. No acute intracranial abnormality. 2. Soft tissue hematoma the posterior scalp. No evidence of underlying calvarial fracture. 3. No acute fracture or traumatic subluxation of the cervical spine. Electronically Signed   By: Lyndall Meade M.D.   On: 04/19/2023 18:22   OCT, Retina - OU - Both Eyes Result Date: 04/18/2023 Right Eye Quality was good. Central Foveal Thickness: 258. Progression has been stable. Findings include normal foveal contour, no IRF, no SRF, retinal drusen , outer retinal atrophy (Trace cystic changes temporal macula, No vitreous opacities, no DME). Left Eye Quality was good. Central Foveal Thickness: 271. Progression has been stable. Findings include normal foveal contour, no IRF, no SRF, retinal drusen , outer retinal atrophy (Mild ERM, trace cystic changes centrally stable improvement in vitreous opacities; partial PVD). Notes *Images captured and stored on drive Diagnosis / Impression: OD: Trace cystic changes temporal macula, No vitreous opacities, no DME OS: Mild ERM, trace cystic changes centrally stable improvement in vitreous opacities; partial PVD Clinical management: See below Abbreviations: NFP - Normal foveal profile. CME - cystoid macular edema. PED - pigment epithelial detachment. IRF - intraretinal fluid. SRF - subretinal fluid. EZ - ellipsoid zone. ERM - epiretinal membrane. ORA - outer retinal atrophy. ORT - outer retinal tubulation. SRHM - subretinal hyper-reflective material     Procedures .Laceration Repair  Date/Time: 04/19/2023 4:01 PM  Performed by: Odell Balls, PA-C Authorized by: Odell Balls, PA-C   Consent:    Consent obtained:  Verbal   Consent given by:  Patient Universal protocol:    Procedure explained and questions answered to patient or proxy's satisfaction: yes     Patient identity confirmed:  Verbally with patient Anesthesia:    Anesthesia method:  Local infiltration   Local anesthetic:  Lidocaine  1% WITH epi Laceration details:    Location:  Scalp   Scalp location:  Crown   Length (cm):  2 Exploration:    Contaminated: no   Treatment:    Area cleansed with:  Saline Skin repair:    Repair method:  Staples   Number of staples:  4 Approximation:    Approximation:  Close Repair type:    Repair type:  Simple Post-procedure  details:    Dressing:  Open (no dressing)   Procedure completion:  Tolerated well, no immediate complications     Medications Ordered in ED Medications  lidocaine -EPINEPHrine  (XYLOCAINE  W/EPI) 2 %-1:200000 (PF) injection 10 mL (10 mLs Infiltration Given by Other 04/19/23 1631)  Tdap (BOOSTRIX ) injection 0.5 mL (0.5 mLs Intramuscular Given 04/19/23 1629)    ED Course/ Medical Decision Making/ A&P Clinical Course as of 04/19/23 1830  Tue Apr 19, 2023  1827 Patient with mechanical fall at home, hit head, laceration to scalp. No LOC, nausea. On aspirin  only.   Laceration repaired as per procedure note. CT head and c-spine negative per radiology interpretation.   Patient remains stable, awake and alert. Stable for discharge.  [SU]    Clinical Course User Index [SU] Odell Balls, PA-C                                 Medical Decision Making Amount and/or Complexity of Data Reviewed Radiology: ordered.  Risk Prescription drug management.           Final Clinical Impression(s) / ED Diagnoses Final diagnoses:  Fall, initial encounter  Minor head injury, initial encounter  Laceration of  scalp, initial encounter    Rx / DC Orders ED Discharge Orders     None         Odell Balls, PA-C 04/19/23 1830    Nicholaus Cassondra DEL, MD 04/22/23 [phone]

## 2023-04-19 NOTE — Discharge Instructions (Signed)
Your head and neck CT (xrays) are negative for any serious injury or fracture.   The staples in the scalp will need to be removed in 10 days. See your doctor sooner if you have any concern for infection - increased swelling or redness, drainage, fever.

## 2023-05-04 ENCOUNTER — Encounter (INDEPENDENT_AMBULATORY_CARE_PROVIDER_SITE_OTHER): Payer: Medicaid Other | Admitting: Ophthalmology

## 2023-05-09 ENCOUNTER — Encounter (INDEPENDENT_AMBULATORY_CARE_PROVIDER_SITE_OTHER): Payer: Medicaid Other | Admitting: Ophthalmology
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
## 2023-06-20 ENCOUNTER — Encounter (INDEPENDENT_AMBULATORY_CARE_PROVIDER_SITE_OTHER): Payer: Self-pay | Admitting: Ophthalmology

## 2023-06-20 ENCOUNTER — Ambulatory Visit (INDEPENDENT_AMBULATORY_CARE_PROVIDER_SITE_OTHER): Payer: Medicaid Other | Admitting: Ophthalmology

## 2023-06-20 DIAGNOSIS — H35033 Hypertensive retinopathy, bilateral: Secondary | ICD-10-CM | POA: Diagnosis not present

## 2023-06-20 DIAGNOSIS — I10 Essential (primary) hypertension: Secondary | ICD-10-CM | POA: Diagnosis not present

## 2023-06-20 DIAGNOSIS — E113593 Type 2 diabetes mellitus with proliferative diabetic retinopathy without macular edema, bilateral: Secondary | ICD-10-CM | POA: Diagnosis not present

## 2023-06-20 DIAGNOSIS — H4313 Vitreous hemorrhage, bilateral: Secondary | ICD-10-CM

## 2023-06-20 DIAGNOSIS — Z961 Presence of intraocular lens: Secondary | ICD-10-CM

## 2023-06-24 ENCOUNTER — Other Ambulatory Visit: Payer: Self-pay | Admitting: Family Medicine

## 2023-06-24 DIAGNOSIS — F17210 Nicotine dependence, cigarettes, uncomplicated: Secondary | ICD-10-CM

## 2023-06-24 DIAGNOSIS — E559 Vitamin D deficiency, unspecified: Secondary | ICD-10-CM

## 2023-06-25 ENCOUNTER — Encounter (INDEPENDENT_AMBULATORY_CARE_PROVIDER_SITE_OTHER): Payer: Self-pay | Admitting: Ophthalmology

## 2023-07-18 ENCOUNTER — Other Ambulatory Visit: Payer: Self-pay | Admitting: Internal Medicine

## 2023-07-18 DIAGNOSIS — Z1231 Encounter for screening mammogram for malignant neoplasm of breast: Secondary | ICD-10-CM

## 2023-07-25 ENCOUNTER — Telehealth (INDEPENDENT_AMBULATORY_CARE_PROVIDER_SITE_OTHER): Payer: Self-pay

## 2023-07-25 NOTE — Telephone Encounter (Signed)
 Pt seeing floaters and veil in OS. Scheduled to come in Tuesday 07/26/23 MS

## 2023-07-25 NOTE — Telephone Encounter (Signed)
 Pt called and LVM stating that as of Friday (5/9) she had floaters and a sheer veil over OS. Called pt back and symptoms have remained the same since then. Per Dr. Karyl Paget, pt needs to be seen today/tomorrow. Pt was offered today (5/12) but chose Tuesday, 5/13 @ 2pm. MS

## 2023-07-26 ENCOUNTER — Ambulatory Visit (INDEPENDENT_AMBULATORY_CARE_PROVIDER_SITE_OTHER): Admitting: Ophthalmology

## 2023-07-26 ENCOUNTER — Encounter (INDEPENDENT_AMBULATORY_CARE_PROVIDER_SITE_OTHER): Payer: Self-pay | Admitting: Ophthalmology

## 2023-07-26 DIAGNOSIS — H35033 Hypertensive retinopathy, bilateral: Secondary | ICD-10-CM | POA: Diagnosis not present

## 2023-07-26 DIAGNOSIS — I10 Essential (primary) hypertension: Secondary | ICD-10-CM | POA: Diagnosis not present

## 2023-07-26 DIAGNOSIS — Z961 Presence of intraocular lens: Secondary | ICD-10-CM

## 2023-07-26 DIAGNOSIS — E113593 Type 2 diabetes mellitus with proliferative diabetic retinopathy without macular edema, bilateral: Secondary | ICD-10-CM

## 2023-07-26 DIAGNOSIS — H4313 Vitreous hemorrhage, bilateral: Secondary | ICD-10-CM | POA: Diagnosis not present

## 2023-07-26 MED ORDER — BEVACIZUMAB CHEMO INJECTION 1.25MG/0.05ML SYRINGE FOR KALEIDOSCOPE
1.2500 mg | INTRAVITREAL | Status: AC | PRN
  Administered 2023-07-26: 1.25 mg via INTRAVITREAL
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
## 2023-08-31 ENCOUNTER — Ambulatory Visit (INDEPENDENT_AMBULATORY_CARE_PROVIDER_SITE_OTHER): Admitting: Ophthalmology

## 2023-08-31 ENCOUNTER — Encounter (INDEPENDENT_AMBULATORY_CARE_PROVIDER_SITE_OTHER): Payer: Self-pay | Admitting: Ophthalmology

## 2023-08-31 DIAGNOSIS — H4313 Vitreous hemorrhage, bilateral: Secondary | ICD-10-CM | POA: Diagnosis not present

## 2023-08-31 DIAGNOSIS — E113593 Type 2 diabetes mellitus with proliferative diabetic retinopathy without macular edema, bilateral: Secondary | ICD-10-CM | POA: Diagnosis not present

## 2023-08-31 DIAGNOSIS — I10 Essential (primary) hypertension: Secondary | ICD-10-CM | POA: Diagnosis not present

## 2023-08-31 DIAGNOSIS — H35033 Hypertensive retinopathy, bilateral: Secondary | ICD-10-CM

## 2023-08-31 DIAGNOSIS — Z961 Presence of intraocular lens: Secondary | ICD-10-CM

## 2023-08-31 MED ORDER — BEVACIZUMAB CHEMO INJECTION 1.25MG/0.05ML SYRINGE FOR KALEIDOSCOPE
1.2500 mg | INTRAVITREAL | Status: AC | PRN
  Administered 2023-08-31: 1.25 mg via INTRAVITREAL
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
## 2023-09-19 ENCOUNTER — Encounter (INDEPENDENT_AMBULATORY_CARE_PROVIDER_SITE_OTHER): Admitting: Ophthalmology

## 2023-09-28 ENCOUNTER — Encounter (INDEPENDENT_AMBULATORY_CARE_PROVIDER_SITE_OTHER): Payer: Self-pay | Admitting: Ophthalmology

## 2023-09-28 ENCOUNTER — Ambulatory Visit (INDEPENDENT_AMBULATORY_CARE_PROVIDER_SITE_OTHER): Admitting: Ophthalmology

## 2023-09-28 DIAGNOSIS — H4313 Vitreous hemorrhage, bilateral: Secondary | ICD-10-CM | POA: Diagnosis not present

## 2023-09-28 DIAGNOSIS — I10 Essential (primary) hypertension: Secondary | ICD-10-CM

## 2023-09-28 DIAGNOSIS — H35033 Hypertensive retinopathy, bilateral: Secondary | ICD-10-CM

## 2023-09-28 DIAGNOSIS — Z961 Presence of intraocular lens: Secondary | ICD-10-CM

## 2023-09-28 DIAGNOSIS — E113593 Type 2 diabetes mellitus with proliferative diabetic retinopathy without macular edema, bilateral: Secondary | ICD-10-CM

## 2023-09-28 MED ORDER — BEVACIZUMAB CHEMO INJECTION 1.25MG/0.05ML SYRINGE FOR KALEIDOSCOPE
1.2500 mg | INTRAVITREAL | Status: AC | PRN
  Administered 2023-09-28: 1.25 mg via INTRAVITREAL
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
## 2023-10-12 ENCOUNTER — Ambulatory Visit (INDEPENDENT_AMBULATORY_CARE_PROVIDER_SITE_OTHER): Admitting: Ophthalmology

## 2023-10-12 ENCOUNTER — Encounter (INDEPENDENT_AMBULATORY_CARE_PROVIDER_SITE_OTHER): Payer: Self-pay | Admitting: Ophthalmology

## 2023-10-12 DIAGNOSIS — Z961 Presence of intraocular lens: Secondary | ICD-10-CM

## 2023-10-12 DIAGNOSIS — H35033 Hypertensive retinopathy, bilateral: Secondary | ICD-10-CM | POA: Diagnosis not present

## 2023-10-12 DIAGNOSIS — H4313 Vitreous hemorrhage, bilateral: Secondary | ICD-10-CM | POA: Diagnosis not present

## 2023-10-12 DIAGNOSIS — E113593 Type 2 diabetes mellitus with proliferative diabetic retinopathy without macular edema, bilateral: Secondary | ICD-10-CM | POA: Diagnosis not present

## 2023-10-12 DIAGNOSIS — I10 Essential (primary) hypertension: Secondary | ICD-10-CM | POA: Diagnosis not present

## 2023-10-16 ENCOUNTER — Encounter (INDEPENDENT_AMBULATORY_CARE_PROVIDER_SITE_OTHER): Payer: Self-pay | Admitting: Ophthalmology
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
## 2023-10-24 ENCOUNTER — Ambulatory Visit: Admitting: Physician Assistant

## 2023-10-24 ENCOUNTER — Encounter: Payer: Self-pay | Admitting: Physician Assistant

## 2023-10-24 VITALS — BP 132/75

## 2023-10-24 DIAGNOSIS — L821 Other seborrheic keratosis: Secondary | ICD-10-CM | POA: Diagnosis not present

## 2023-10-24 DIAGNOSIS — D485 Neoplasm of uncertain behavior of skin: Secondary | ICD-10-CM

## 2023-10-24 DIAGNOSIS — D492 Neoplasm of unspecified behavior of bone, soft tissue, and skin: Secondary | ICD-10-CM

## 2023-10-24 NOTE — Patient Instructions (Signed)

## 2023-10-24 NOTE — Progress Notes (Signed)
   New Patient Visit   Subjective  Anna Wagner is a 63 y.o. female who presents for the following: Lesion on left temple that has been there for a couple of years. Started out small and has grown in size. Bleeds excessively with trauma.   Other concern: scaly area on right upper cheek. NO HISTORY of skin cancer. Does have diabetes.    The following portions of the chart were reviewed this encounter and updated as appropriate: medications, allergies, medical history  Review of Systems:  No other skin or systemic complaints except as noted in HPI or Assessment and Plan.  Objective  Well appearing patient in no apparent distress; mood and affect are within normal limits.    A focused examination was performed of the following areas: Face  Relevant exam findings are noted in the Assessment and Plan.  Left Temple Cystic vascular papule 0.7 cm  Right Upper Eyelid Irregular brown macule 3.5 mm   Assessment & Plan   SEBORRHEIC KERATOSIS - Stuck-on, waxy, tan-brown papule of right medial cheek - Benign-appearing - Discussed benign etiology and prognosis. 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 I discussed with patient that this may be two separate visits.    I, Roseline Hutchinson, CMA, am acting as scribe for Linzy Darling K, PA-C .   Documentation: I have reviewed the above documentation for accuracy and completeness, and I agree with the above.  Jilberto Vanderwall K, PA-C

## 2023-10-26 ENCOUNTER — Encounter (INDEPENDENT_AMBULATORY_CARE_PROVIDER_SITE_OTHER): Payer: Self-pay | Admitting: Ophthalmology

## 2023-10-26 ENCOUNTER — Ambulatory Visit (INDEPENDENT_AMBULATORY_CARE_PROVIDER_SITE_OTHER): Admitting: Ophthalmology

## 2023-10-26 VITALS — BP 147/77 | HR 60

## 2023-10-26 DIAGNOSIS — I10 Essential (primary) hypertension: Secondary | ICD-10-CM | POA: Diagnosis not present

## 2023-10-26 DIAGNOSIS — E113593 Type 2 diabetes mellitus with proliferative diabetic retinopathy without macular edema, bilateral: Secondary | ICD-10-CM | POA: Diagnosis not present

## 2023-10-26 DIAGNOSIS — H35033 Hypertensive retinopathy, bilateral: Secondary | ICD-10-CM

## 2023-10-26 DIAGNOSIS — Z961 Presence of intraocular lens: Secondary | ICD-10-CM

## 2023-10-26 DIAGNOSIS — H4313 Vitreous hemorrhage, bilateral: Secondary | ICD-10-CM

## 2023-10-26 MED ORDER — BEVACIZUMAB CHEMO INJECTION 1.25MG/0.05ML SYRINGE FOR KALEIDOSCOPE
1.2500 mg | INTRAVITREAL | Status: AC | PRN
  Administered 2023-10-26 (×2): 1.25 mg via INTRAVITREAL
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
## 2023-11-23 ENCOUNTER — Encounter (INDEPENDENT_AMBULATORY_CARE_PROVIDER_SITE_OTHER): Payer: Self-pay | Admitting: Ophthalmology

## 2023-11-23 ENCOUNTER — Ambulatory Visit (INDEPENDENT_AMBULATORY_CARE_PROVIDER_SITE_OTHER): Admitting: Ophthalmology

## 2023-11-23 DIAGNOSIS — I10 Essential (primary) hypertension: Secondary | ICD-10-CM | POA: Diagnosis not present

## 2023-11-23 DIAGNOSIS — E113593 Type 2 diabetes mellitus with proliferative diabetic retinopathy without macular edema, bilateral: Secondary | ICD-10-CM | POA: Diagnosis not present

## 2023-11-23 DIAGNOSIS — H35033 Hypertensive retinopathy, bilateral: Secondary | ICD-10-CM

## 2023-11-23 DIAGNOSIS — H4313 Vitreous hemorrhage, bilateral: Secondary | ICD-10-CM | POA: Diagnosis not present

## 2023-11-23 DIAGNOSIS — Z961 Presence of intraocular lens: Secondary | ICD-10-CM

## 2023-11-23 MED ORDER — BEVACIZUMAB CHEMO INJECTION 1.25MG/0.05ML SYRINGE FOR KALEIDOSCOPE
1.2500 mg | INTRAVITREAL | Status: AC | PRN
  Administered 2023-11-23: 1.25 mg via INTRAVITREAL
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
## 2023-12-16 DIAGNOSIS — I10 Essential (primary) hypertension: Secondary | ICD-10-CM

## 2023-12-21 ENCOUNTER — Encounter (INDEPENDENT_AMBULATORY_CARE_PROVIDER_SITE_OTHER): Payer: Self-pay | Admitting: Ophthalmology

## 2023-12-21 ENCOUNTER — Ambulatory Visit (INDEPENDENT_AMBULATORY_CARE_PROVIDER_SITE_OTHER): Admitting: Ophthalmology

## 2023-12-21 DIAGNOSIS — H4313 Vitreous hemorrhage, bilateral: Secondary | ICD-10-CM

## 2023-12-21 DIAGNOSIS — E113593 Type 2 diabetes mellitus with proliferative diabetic retinopathy without macular edema, bilateral: Secondary | ICD-10-CM

## 2023-12-21 DIAGNOSIS — H35033 Hypertensive retinopathy, bilateral: Secondary | ICD-10-CM

## 2023-12-21 DIAGNOSIS — I10 Essential (primary) hypertension: Secondary | ICD-10-CM

## 2023-12-21 DIAGNOSIS — Z961 Presence of intraocular lens: Secondary | ICD-10-CM

## 2023-12-21 MED ORDER — BEVACIZUMAB CHEMO INJECTION 1.25MG/0.05ML SYRINGE FOR KALEIDOSCOPE
1.2500 mg | INTRAVITREAL | Status: AC | PRN
  Administered 2023-12-21: 1.25 mg via INTRAVITREAL

## 2024-01-17 ENCOUNTER — Encounter: Admitting: Dermatology
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
## 2024-01-20 ENCOUNTER — Encounter (INDEPENDENT_AMBULATORY_CARE_PROVIDER_SITE_OTHER): Admitting: Ophthalmology

## 2024-01-20 DIAGNOSIS — E113593 Type 2 diabetes mellitus with proliferative diabetic retinopathy without macular edema, bilateral: Secondary | ICD-10-CM

## 2024-01-20 DIAGNOSIS — H35033 Hypertensive retinopathy, bilateral: Secondary | ICD-10-CM

## 2024-01-20 DIAGNOSIS — I10 Essential (primary) hypertension: Secondary | ICD-10-CM

## 2024-01-20 DIAGNOSIS — H4313 Vitreous hemorrhage, bilateral: Secondary | ICD-10-CM

## 2024-01-20 DIAGNOSIS — Z961 Presence of intraocular lens: Secondary | ICD-10-CM
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
## 2024-01-25 ENCOUNTER — Ambulatory Visit (INDEPENDENT_AMBULATORY_CARE_PROVIDER_SITE_OTHER): Admitting: Ophthalmology

## 2024-01-25 ENCOUNTER — Encounter (INDEPENDENT_AMBULATORY_CARE_PROVIDER_SITE_OTHER): Payer: Self-pay | Admitting: Ophthalmology

## 2024-01-25 DIAGNOSIS — I10 Essential (primary) hypertension: Secondary | ICD-10-CM | POA: Diagnosis not present

## 2024-01-25 DIAGNOSIS — H35033 Hypertensive retinopathy, bilateral: Secondary | ICD-10-CM

## 2024-01-25 DIAGNOSIS — E113593 Type 2 diabetes mellitus with proliferative diabetic retinopathy without macular edema, bilateral: Secondary | ICD-10-CM | POA: Diagnosis not present

## 2024-01-25 DIAGNOSIS — Z961 Presence of intraocular lens: Secondary | ICD-10-CM

## 2024-01-25 DIAGNOSIS — H4313 Vitreous hemorrhage, bilateral: Secondary | ICD-10-CM | POA: Diagnosis not present

## 2024-01-25 MED ORDER — BEVACIZUMAB CHEMO INJECTION 1.25MG/0.05ML SYRINGE FOR KALEIDOSCOPE
1.2500 mg | INTRAVITREAL | Status: AC | PRN
  Administered 2024-01-25: 1.25 mg via INTRAVITREAL

## 2024-02-01 ENCOUNTER — Telehealth: Payer: Self-pay

## 2024-02-01 ENCOUNTER — Ambulatory Visit (INDEPENDENT_AMBULATORY_CARE_PROVIDER_SITE_OTHER): Admitting: Adult Health

## 2024-02-01 ENCOUNTER — Encounter: Payer: Self-pay | Admitting: Adult Health

## 2024-02-01 VITALS — BP 138/64 | HR 60 | Ht 64.0 in | Wt 184.6 lb

## 2024-02-01 DIAGNOSIS — R0683 Snoring: Secondary | ICD-10-CM | POA: Diagnosis not present

## 2024-02-01 DIAGNOSIS — J209 Acute bronchitis, unspecified: Secondary | ICD-10-CM

## 2024-02-01 DIAGNOSIS — Z23 Encounter for immunization: Secondary | ICD-10-CM

## 2024-02-01 MED ORDER — DOXYCYCLINE HYCLATE 100 MG PO TABS
100.0000 mg | ORAL_TABLET | Freq: Two times a day (BID) | ORAL | 0 refills | Status: AC
Start: 2024-02-01 — End: ?

## 2024-02-01 NOTE — Patient Instructions (Addendum)
 Set up for home sleep study  Work on healthy weight loss  Do not drive if sleepy  Chest xray today.  Doxycycline 100mg  Twice daily  for 1 week.  Robitussin DM 1 tsp every 4hr as needed  Albuterol  inhaler or neb As needed   Follow up in 6 weeks and As needed

## 2024-02-01 NOTE — Progress Notes (Signed)
 @Patient  ID: Anna Wagner, female    DOB: [birth year], 63 y.o.   MRN: [id number]  Chief Complaint  Patient presents with   Consult    sleep    Referring provider: Cloria Annabella CROME, DO  HPI: 63 year old smoker female seen for sleep consult February 01, 2024 for loud snoring, restless sleep, and daytime fatigue.  Medical history significant for diabetes, hypertension    TEST/EVENTS : Reviewed 02/01/2024  Discussed the use of AI scribe software for clinical note transcription with the patient, who gave verbal consent to proceed.  History of Present Illness Anna Wagner is a 63 year old female with diabetes and peripheral neuropathy who presents with snoring and restless sleep. She was referred by Dr. Nola for evaluation of her sleep issues.  She has experienced snoring and restless sleep for an extended period, describing it as 'forever.' Despite not feeling refreshed upon waking and lacking energy, she does not experience significant daytime sleepiness due to the stimulation from her grandchildren. She uses trazodone to aid sleep, particularly when her peripheral neuropathy symptoms are not severe.  She has had peripheral neuropathy since 1998, which causes burning sensations in her feet and hands and affects her sleep. She takes Lyrica  for pain management related to her neuropathy.  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 [place] ulcer [place] became infected and did not heal, leading to the amputation. She also has diabetic retinopathy.  She has a history of smoking, having quit approximately 10 to 15 years ago, but has resumed smoking. She denies alcohol use and reports occasional marijuana use. She is currently disabled and does not drive due to her health conditions.  She has been experiencing a cough and congestion for about three weeks, which she attributes to a recent illness, possibly the flu or  COVID-19. She has not sought medical attention for this illness but has been using a nebulizer with albuterol  to manage her symptoms. She smoking daily. Her diabetic control is generally good, although she experienced a low blood sugar episode recently.  Typically goes to bed around 10 PM.  Gets up at 7 AM.  Up a few times throughout the night.  Weight is currently at 184 pounds with a BMI at 31.  No history of stroke or congestive heart failure.  Has no symptoms suspicious for cataplexy or sleep paralysis.  Past Surgical History:  Procedure Laterality Date   AMPUTATION Left 03/15/2014   Procedure: AMPUTATION TRANSMETATARSAL;  Surgeon: Jerona Harden GAILS, MD;  Location: [facility] OR;  Service: Orthopedics;  Laterality: Left;   CESAREAN SECTION     CHOLECYSTECTOMY     PARS PLANA VITRECTOMY Right 05/22/2020   Procedure: PARS PLANA VITRECTOMY WITH 25 GAUGE;  Surgeon: Valdemar Rogue, MD;  Location: [facility] OR;  Service: Ophthalmology;  Laterality: Right;     No Known Allergies  Immunization History  Administered Date(s) Administered   Influenza,inj,Quad PF,6+ Mos 03/16/2014, 11/16/2016, 01/19/2018   Influenza-Unspecified 01/12/2016   Pneumococcal Polysaccharide-23 09/20/2013   Tdap 09/28/2016, 04/19/2023    Past Medical History:  Diagnosis Date   Bell's palsy 2005   Bell's palsy    Cataract    Depression    Diabetes mellitus without complication (HCC)    Diabetic retinopathy (HCC)    PDR OU   HLD (hyperlipidemia)    Hypertension    Hypertensive retinopathy    OU   Neuromuscular disorder (HCC)  NEUROPATHY   Neuropathy    Sepsis (HCC) 02/11/2016    Tobacco History: Social History   Tobacco Use  Smoking Status Every Day   Current packs/day: 0.00   Average packs/day: 1 pack/day for 38.0 years (38.0 ttl pk-yrs)   Types: Cigarettes   Start date: 09/20/1975   Last attempt to quit: 09/19/2013   Years since quitting: 10.3  Smokeless Tobacco Never  Tobacco Comments   Has smoked for about 45  years, about 1ppd   Ready to quit: Not Answered Counseling given: Not Answered Tobacco comments: Has smoked for about 45 years, about 1ppd   Outpatient Medications Prior to Visit  Medication Sig Dispense Refill   amLODipine-valsartan (EXFORGE) 10-320 MG tablet Take 1 tablet by mouth daily.     aspirin  EC 81 MG tablet Take 81 mg by mouth at bedtime.      atorvastatin  (LIPITOR) 20 MG tablet Take 20 mg by mouth daily.     Cholecalciferol (VITAMIN D) 50 MCG (2000 UT) tablet Take 2,000 Units by mouth daily.     cyanocobalamin (VITAMIN B12) 500 MCG tablet Take 1,000 mcg by mouth daily.     Dulaglutide (TRULICITY) 1.5 MG/0.5ML SOPN Inject 1.5 mg into the skin once a week.     DULoxetine  (CYMBALTA ) 60 MG capsule Take 1 capsule (60 mg total) by mouth daily. 30 capsule 6   gatifloxacin  (ZYMAXID ) 0.5 % SOLN Place 1 drop into the right eye 4 (four) times daily.     hydrALAZINE (APRESOLINE) 50 MG tablet Take 50 mg by mouth 3 (three) times daily.     Insulin  Syringe-Needle U-100 (TRUEPLUS INSULIN  SYRINGE) 30G X 5/16 1 ML MISC Use as directed 100 each 2   loperamide (IMODIUM) 2 MG capsule Take 2 mg by mouth daily.     metFORMIN  (GLUCOPHAGE ) 1000 MG tablet TAKE 1 TABLET BY MOUTH TWICE DAILY WITH A MEAL 60 tablet 6   metoprolol  succinate (TOPROL -XL) 100 MG 24 hr tablet Take 100 mg by mouth daily. Take with or immediately following a meal.     Omega-3 1000 MG CAPS Take 1,000 mg by mouth in the morning and at bedtime.     pregabalin  (LYRICA ) 100 MG capsule Take 100 mg by mouth in the morning, at noon, and at bedtime.     traZODone (DESYREL) 100 MG tablet Take 200 mg by mouth at bedtime.     bacitracin -polymyxin b  (POLYSPORIN ) ophthalmic ointment Place 1 application into the right eye 4 (four) times daily. apply to eye every 12 hours while awake (Patient not taking: Reported on 02/01/2024)     brimonidine  (ALPHAGAN ) 0.15 % ophthalmic solution Place 1 drop into the right eye 2 (two) times daily. (Patient not  taking: Reported on 02/01/2024)     erythromycin  ophthalmic ointment Place 1 application into both eyes as needed. (Patient not taking: Reported on 02/01/2024) 3.5 g 1   insulin  glargine (LANTUS ) 100 UNIT/ML injection Inject 100 Units into the skin every morning. (Patient not taking: Reported on 02/01/2024)     liraglutide  (VICTOZA ) 18 MG/3ML SOPN Inject 18 mg into the skin every morning. (Patient not taking: Reported on 02/01/2024)     XIIDRA 5 % SOLN Place 1 drop into both eyes in the morning and at bedtime. (Patient not taking: Reported on 02/01/2024)     No facility-administered medications prior to visit.     Review of Systems:   Constitutional:   No  weight loss, night sweats,  Fevers, chills,+ fatigue, or  lassitude.  HEENT:   No headaches,  Difficulty swallowing,  Tooth/dental problems, or  Sore throat,                No sneezing, itching, ear ache, +nasal congestion, post nasal drip,   CV:  No chest pain,  Orthopnea, PND, swelling in lower extremities, anasarca, dizziness, palpitations, syncope.   GI  No heartburn, indigestion, abdominal pain, nausea, vomiting, diarrhea, change in bowel habits, loss of appetite, bloody stools.   Resp:   No chest wall deformity  Skin: no rash or lesions.  GU: no dysuria, change in color of urine, no urgency or frequency.  No flank pain, no hematuria   MS:  No joint pain or swelling.  No decreased range of motion.  No back pain.    Physical Exam  BP 138/64   Pulse 60   Ht 5' 4 (1.626 m) Comment: Per pt  Wt 184 lb 9.6 oz (83.7 kg)   LMP  (LMP Unknown)   SpO2 94% Comment: RA  BMI 31.69 kg/m   GEN: A/Ox3; pleasant , NAD, well nourished    HEENT:  [place]/AT, , NOSE-clear, THROAT-clear, no lesions, no postnasal drip or exudate noted. Class 3 MP airway   NECK:  Supple w/ fair ROM; no JVD; normal carotid impulses w/o bruits; no thyromegaly or nodules palpated; no lymphadenopathy.    RESP  Scattered rhonchi . no accessory muscle use, no  dullness to percussion  CARD:  RRR, no m/r/g, no peripheral edema, pulses intact, no cyanosis or clubbing.  GI:   Soft & nt; nml bowel sounds; no organomegaly or masses detected.   Musco: Warm bil, no deformities or joint swelling noted. LE braces   Neuro: alert, no focal deficits noted.    Skin: Warm, no lesions or rashes    Lab Results:Reviewed 02/01/2024    BNP No results found for: BNP  ProBNP No results found for: PROBNP          No data to display          No results found for: NITRICOXIDE     02/01/2024   10:00 AM  Results of the Epworth flowsheet  Sitting and reading 0  Watching TV 0  Sitting, inactive in a public place (e.g. a theatre or a meeting) 0  As a passenger in a car for an hour without a break 0  Lying down to rest in the afternoon when circumstances permit 0  Sitting and talking to someone 0  Sitting quietly after a lunch without alcohol 0  In a car, while stopped for a few minutes in traffic 0  Total score 0        Assessment & Plan:   Assessment and Plan Assessment & Plan Possible obstructive sleep apnea with snoring and restless sleep   Chronic snoring and restless sleep are present, with no prior sleep study conducted. Her husband reports apnea episodes, though she experiences no daytime sleepiness. Risk factors include diabetes. Potential complications of untreated sleep apnea, such as increased blood sugar levels, cardiovascular issues, and cognitive impairment, were discussed. The home sleep study process and possible need for CPAP therapy if diagnosed were explained. A home sleep study was ordered through [facility], and information on sleep apnea was provided. A follow-up is scheduled in six weeks to discuss results. - discussed how weight can impact sleep and risk for sleep disordered breathing - discussed options to assist with weight loss: combination of diet modification, cardiovascular and strength training  exercises   - had an extensive discussion regarding the adverse health consequences related to untreated sleep disordered breathing - specifically discussed the risks for hypertension, coronary artery disease, cardiac dysrhythmias, cerebrovascular disease, and diabetes - lifestyle modification discussed   - discussed how sleep disruption can increase risk of accidents, particularly when driving - safe driving practices were discussed   Insomnia-stable on therapy  Continue on Trazodone At bedtime, via PCP.   Morbid obesity with BMI 31-healthy weight loss discussed  Acute bronchitis -ongoing symptoms with cough/congestion for 3 weeks.   She has a persistent cough and congestion for three weeks with no prior treatment. Albuterol  inhaler or Nebulizer  The potential for pneumonia and the need for a chest x-ray were discussed. Doxycycline was prescribed for bronchitis, and symptomatic relief options were provided. A chest x-ray was ordered to rule out pneumonia. Doxycycline 100 mg is prescribed twice daily for one week. Robitussin DM is recommended as needed for cough, and the use of an albuterol  nebulizer or inhaler is advised as needed.    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 The impact of smoking on respiratory health and potential exacerbation of bronchitis symptoms were discussed. Smoking cessation was advised. Participates in lung cancer CT screening program.       Anna Stank, NP 02/01/2024

## 2024-02-01 NOTE — Telephone Encounter (Signed)
 Faxed signed prescription for Doxycycline to [facility] at [phone], per Tammy Parrett's request. Awaiting fax confirmation.

## 2024-02-02 NOTE — Telephone Encounter (Signed)
 Fax confirmation received, [facility]

## 2024-02-07 ENCOUNTER — Inpatient Hospital Stay (HOSPITAL_COMMUNITY): Admission: RE | Admit: 2024-02-07 | Source: Ambulatory Visit
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
## 2024-02-14 ENCOUNTER — Telehealth: Payer: Self-pay

## 2024-02-14 NOTE — Telephone Encounter (Signed)
 Received forms via fax from [facility] to be filled out at office visit on 03/20/24.  Placed in black file folder in B Pod until pt's OV.

## 2024-02-21 ENCOUNTER — Ambulatory Visit

## 2024-02-22 ENCOUNTER — Encounter (INDEPENDENT_AMBULATORY_CARE_PROVIDER_SITE_OTHER): Admitting: Ophthalmology

## 2024-02-27 ENCOUNTER — Encounter (INDEPENDENT_AMBULATORY_CARE_PROVIDER_SITE_OTHER): Payer: Self-pay | Admitting: Ophthalmology

## 2024-02-27 ENCOUNTER — Ambulatory Visit (INDEPENDENT_AMBULATORY_CARE_PROVIDER_SITE_OTHER): Admitting: Ophthalmology

## 2024-02-27 DIAGNOSIS — H35033 Hypertensive retinopathy, bilateral: Secondary | ICD-10-CM

## 2024-02-27 DIAGNOSIS — Z961 Presence of intraocular lens: Secondary | ICD-10-CM

## 2024-02-27 DIAGNOSIS — H4313 Vitreous hemorrhage, bilateral: Secondary | ICD-10-CM

## 2024-02-27 DIAGNOSIS — E113593 Type 2 diabetes mellitus with proliferative diabetic retinopathy without macular edema, bilateral: Secondary | ICD-10-CM | POA: Diagnosis not present

## 2024-02-27 DIAGNOSIS — I10 Essential (primary) hypertension: Secondary | ICD-10-CM

## 2024-02-27 MED ORDER — BEVACIZUMAB CHEMO INJECTION 1.25MG/0.05ML SYRINGE FOR KALEIDOSCOPE
1.2500 mg | INTRAVITREAL | Status: AC | PRN
  Administered 2024-02-27: 13:00:00 1.25 mg via INTRAVITREAL

## 2024-03-05 ENCOUNTER — Encounter

## 2024-03-05 DIAGNOSIS — R0683 Snoring: Secondary | ICD-10-CM

## 2024-03-20 ENCOUNTER — Ambulatory Visit: Admitting: Adult Health

## 2024-03-20 ENCOUNTER — Telehealth: Payer: Self-pay

## 2024-03-20 NOTE — Telephone Encounter (Signed)
 Pt's HST has not been uploading into chart. Per PCCs:  I don't see a sleep study on our end. Im assuming the patient didn't have a good sleep study done. Might need to reschedule the follow up appointment  Per PCCs, regarding scheduling her sleep study:   Oh no ill give her a call to reschedule for the soonest available.  Called and rescheduled pt to 04/26/24 with Tammy.

## 2024-03-22 ENCOUNTER — Inpatient Hospital Stay: Admission: RE | Admit: 2024-03-22 | Source: Ambulatory Visit

## 2024-03-26 NOTE — Telephone Encounter (Signed)
 Seems as though she will need to repeat HST. I have LVM with her to get scheduled.

## 2024-03-26 NOTE — Telephone Encounter (Signed)
 Does she need to repeat?
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
## 2024-03-29 ENCOUNTER — Encounter: Payer: Self-pay | Admitting: Dermatology

## 2024-03-30 NOTE — Telephone Encounter (Signed)
 Has this patient been able to be reached yet to get her HST rescheduled?

## 2024-03-30 NOTE — Telephone Encounter (Signed)
 ATC again today LVM with direct line to schedule HST

## 2024-04-03 ENCOUNTER — Encounter: Payer: Self-pay | Admitting: Dermatology

## 2024-04-04 ENCOUNTER — Ambulatory Visit: Admitting: Dermatology

## 2024-04-04 ENCOUNTER — Encounter: Payer: Self-pay | Admitting: Dermatology

## 2024-04-04 VITALS — BP 141/62 | HR 59 | Temp 98.2°F

## 2024-04-04 DIAGNOSIS — I781 Nevus, non-neoplastic: Secondary | ICD-10-CM

## 2024-04-04 DIAGNOSIS — D485 Neoplasm of uncertain behavior of skin: Secondary | ICD-10-CM | POA: Diagnosis not present

## 2024-04-04 DIAGNOSIS — D492 Neoplasm of unspecified behavior of bone, soft tissue, and skin: Secondary | ICD-10-CM
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
## 2024-04-04 NOTE — Progress Notes (Signed)
" ° °  Follow-Up Visit   Subjective  Anna Wagner is a 64 y.o. female who presents for the following: Excision of a neoplasm of skin on the left temple, referred by Erminio Like, PA-C.  The following portions of the chart were reviewed this encounter and updated as appropriate: medications, allergies, medical history  Review of Systems:  No other skin or systemic complaints except as noted in HPI or Assessment and Plan.  Objective  Well appearing patient in no apparent distress; mood and affect are within normal limits.  A focused examination was performed of the following areas: Face  Relevant physical exam findings are noted in the Assessment and Plan.   Left Temple 1.0 cm Violaceous plaque   Assessment & Plan   NEOPLASM OF SKIN Left Temple - Skin excision  Excision method:  elliptical Lesion length (cm):  1.1 Lesion width (cm):  0.1 Margin per side (cm):  0.1 Total excision diameter (cm):  1.3 Informed consent: discussed and consent obtained   Timeout: patient name, date of birth, surgical site, and procedure verified   Procedure prep:  Patient was prepped and draped in usual sterile fashion Prep type:  Isopropyl alcohol and chlorhexidine  Anesthesia: the lesion was anesthetized in a standard fashion   Anesthetic:  1% lidocaine  w/ epinephrine  1-100,000 buffered w/ 8.4% NaHCO3 Instrument used: #15 blade   Hemostasis achieved with: pressure and electrodesiccation   Outcome: patient tolerated procedure well with no complications   Post-procedure details: sterile dressing applied and wound care instructions given   Dressing type: pressure dressing and bandage    Specimen 1 - Surgical pathology Differential Diagnosis: cyst VS other  Check Margins: No  Patient was notified of results and repair options were discussed, including second intention healing. After reviewing the advantages and disadvantages of each, we agreed on second intention healing as appropriate.   The  surgical site was then lightly scrubbed with sterile, saline-soaked gauze.  The area was bandaged using Vaseline ointment, non-adherent gauze, gauze pads, and tape to provide an adequate pressure dressing.   The patient tolerated the procedure well, was given detailed written and verbal wound care instructions, and was discharged in good condition.  The patient will follow-up in 4 weeks and as scheduled with primary dermatologist.  Return if symptoms worsen or fail to improve.  I, Doyce Pan, CMA, am acting as scribe for RUFUS CHRISTELLA HOLY, MD.   Documentation: I have reviewed the above documentation for accuracy and completeness, and I agree with the above.  RUFUS CHRISTELLA HOLY, MD  "

## 2024-04-05 ENCOUNTER — Ambulatory Visit: Payer: Self-pay | Admitting: Dermatology

## 2024-04-05 LAB — SURGICAL PATHOLOGY

## 2024-04-09 ENCOUNTER — Encounter (INDEPENDENT_AMBULATORY_CARE_PROVIDER_SITE_OTHER): Admitting: Ophthalmology

## 2024-04-09 DIAGNOSIS — E113593 Type 2 diabetes mellitus with proliferative diabetic retinopathy without macular edema, bilateral: Secondary | ICD-10-CM

## 2024-04-09 DIAGNOSIS — H4313 Vitreous hemorrhage, bilateral: Secondary | ICD-10-CM

## 2024-04-09 DIAGNOSIS — Z961 Presence of intraocular lens: Secondary | ICD-10-CM

## 2024-04-09 DIAGNOSIS — H35033 Hypertensive retinopathy, bilateral: Secondary | ICD-10-CM

## 2024-04-09 DIAGNOSIS — I10 Essential (primary) hypertension: Secondary | ICD-10-CM

## 2024-04-12 ENCOUNTER — Ambulatory Visit (INDEPENDENT_AMBULATORY_CARE_PROVIDER_SITE_OTHER): Admitting: Ophthalmology

## 2024-04-12 ENCOUNTER — Encounter (INDEPENDENT_AMBULATORY_CARE_PROVIDER_SITE_OTHER): Payer: Self-pay | Admitting: Ophthalmology

## 2024-04-12 DIAGNOSIS — H4313 Vitreous hemorrhage, bilateral: Secondary | ICD-10-CM | POA: Diagnosis not present

## 2024-04-12 DIAGNOSIS — H35033 Hypertensive retinopathy, bilateral: Secondary | ICD-10-CM

## 2024-04-12 DIAGNOSIS — Z961 Presence of intraocular lens: Secondary | ICD-10-CM | POA: Diagnosis not present

## 2024-04-12 DIAGNOSIS — I10 Essential (primary) hypertension: Secondary | ICD-10-CM | POA: Diagnosis not present

## 2024-04-12 DIAGNOSIS — E113593 Type 2 diabetes mellitus with proliferative diabetic retinopathy without macular edema, bilateral: Secondary | ICD-10-CM

## 2024-04-12 MED ORDER — BEVACIZUMAB CHEMO INJECTION 1.25MG/0.05ML SYRINGE FOR KALEIDOSCOPE
1.2500 mg | INTRAVITREAL | Status: AC | PRN
  Administered 2024-04-12: 1.25 mg via INTRAVITREAL

## 2024-04-16 ENCOUNTER — Ambulatory Visit

## 2024-04-19 NOTE — Telephone Encounter (Signed)
 ATC and LVM with direct line to schedule.

## 2024-04-19 NOTE — Telephone Encounter (Signed)
 Any update on this pt? Have you been able to reach her to get scheduled for HST?

## 2024-04-26 ENCOUNTER — Ambulatory Visit: Admitting: Adult Health

## 2024-05-04 ENCOUNTER — Encounter (INDEPENDENT_AMBULATORY_CARE_PROVIDER_SITE_OTHER): Admitting: Ophthalmology

## 2024-05-16 ENCOUNTER — Ambulatory Visit
